# Patient Record
Sex: Female | Born: 1955 | Race: White | Hispanic: No | Marital: Married | Smoking: Former smoker
Health system: Southern US, Community
[De-identification: ages and names within clinical notes are randomized; demographics above are authoritative.]

## PROBLEM LIST (undated history)

## (undated) DIAGNOSIS — R87619 Unspecified abnormal cytological findings in specimens from cervix uteri: Secondary | ICD-10-CM

## (undated) DIAGNOSIS — R51 Headache: Secondary | ICD-10-CM

## (undated) DIAGNOSIS — M8580 Other specified disorders of bone density and structure, unspecified site: Secondary | ICD-10-CM

## (undated) DIAGNOSIS — N841 Polyp of cervix uteri: Secondary | ICD-10-CM

## (undated) DIAGNOSIS — R3915 Urgency of urination: Secondary | ICD-10-CM

## (undated) DIAGNOSIS — C50919 Malignant neoplasm of unspecified site of unspecified female breast: Secondary | ICD-10-CM

## (undated) DIAGNOSIS — D649 Anemia, unspecified: Secondary | ICD-10-CM

## (undated) DIAGNOSIS — N83209 Unspecified ovarian cyst, unspecified side: Secondary | ICD-10-CM

## (undated) DIAGNOSIS — R202 Paresthesia of skin: Secondary | ICD-10-CM

## (undated) DIAGNOSIS — D369 Benign neoplasm, unspecified site: Secondary | ICD-10-CM

## (undated) DIAGNOSIS — G8929 Other chronic pain: Secondary | ICD-10-CM

## (undated) DIAGNOSIS — D219 Benign neoplasm of connective and other soft tissue, unspecified: Secondary | ICD-10-CM

## (undated) DIAGNOSIS — IMO0002 Reserved for concepts with insufficient information to code with codable children: Secondary | ICD-10-CM

## (undated) DIAGNOSIS — N200 Calculus of kidney: Secondary | ICD-10-CM

## (undated) HISTORY — DX: Benign neoplasm of connective and other soft tissue, unspecified: D21.9

## (undated) HISTORY — DX: Paresthesia of skin: R20.2

## (undated) HISTORY — PX: WISDOM TOOTH EXTRACTION: SHX21

## (undated) HISTORY — DX: Benign neoplasm, unspecified site: D36.9

## (undated) HISTORY — DX: Polyp of cervix uteri: N84.1

## (undated) HISTORY — DX: Urgency of urination: R39.15

## (undated) HISTORY — PX: MASTECTOMY: SHX3

## (undated) HISTORY — DX: Reserved for concepts with insufficient information to code with codable children: IMO0002

## (undated) HISTORY — DX: Malignant neoplasm of unspecified site of unspecified female breast: C50.919

## (undated) HISTORY — PX: BREAST LUMPECTOMY: SHX2

## (undated) HISTORY — DX: Headache: R51

## (undated) HISTORY — DX: Other chronic pain: G89.29

## (undated) HISTORY — PX: RECONSTRUCTION BREAST W/ TRAM FLAP: SUR1079

## (undated) HISTORY — DX: Other specified disorders of bone density and structure, unspecified site: M85.80

## (undated) HISTORY — PX: BREAST ENHANCEMENT SURGERY: SHX7

## (undated) HISTORY — DX: Unspecified abnormal cytological findings in specimens from cervix uteri: R87.619

## (undated) HISTORY — DX: Anemia, unspecified: D64.9

## (undated) HISTORY — DX: Unspecified ovarian cyst, unspecified side: N83.209

## (undated) HISTORY — DX: Calculus of kidney: N20.0

---

## 1998-06-02 ENCOUNTER — Other Ambulatory Visit: Admission: RE | Admit: 1998-06-02 | Discharge: 1998-06-02 | Payer: Self-pay | Admitting: Obstetrics & Gynecology

## 1998-09-01 ENCOUNTER — Other Ambulatory Visit: Admission: RE | Admit: 1998-09-01 | Discharge: 1998-09-01 | Payer: Self-pay | Admitting: Obstetrics and Gynecology

## 1999-02-06 ENCOUNTER — Ambulatory Visit (HOSPITAL_COMMUNITY): Admission: RE | Admit: 1999-02-06 | Discharge: 1999-02-06 | Payer: Self-pay | Admitting: Obstetrics and Gynecology

## 1999-02-06 ENCOUNTER — Encounter: Payer: Self-pay | Admitting: Obstetrics and Gynecology

## 1999-07-09 ENCOUNTER — Inpatient Hospital Stay (HOSPITAL_COMMUNITY): Admission: AD | Admit: 1999-07-09 | Discharge: 1999-07-11 | Payer: Self-pay | Admitting: Obstetrics & Gynecology

## 1999-08-15 ENCOUNTER — Encounter: Admission: RE | Admit: 1999-08-15 | Discharge: 1999-11-13 | Payer: Self-pay | Admitting: Obstetrics and Gynecology

## 2003-06-09 ENCOUNTER — Emergency Department (HOSPITAL_COMMUNITY): Admission: EM | Admit: 2003-06-09 | Discharge: 2003-06-09 | Payer: Self-pay | Admitting: Emergency Medicine

## 2004-01-12 ENCOUNTER — Ambulatory Visit: Admission: RE | Admit: 2004-01-12 | Discharge: 2004-01-12 | Payer: Self-pay | Admitting: Gynecologic Oncology

## 2004-03-27 ENCOUNTER — Ambulatory Visit (HOSPITAL_COMMUNITY): Admission: RE | Admit: 2004-03-27 | Discharge: 2004-03-27 | Payer: Self-pay | Admitting: Obstetrics and Gynecology

## 2004-05-09 ENCOUNTER — Ambulatory Visit: Payer: Self-pay | Admitting: Oncology

## 2004-08-10 ENCOUNTER — Ambulatory Visit (HOSPITAL_COMMUNITY): Admission: RE | Admit: 2004-08-10 | Discharge: 2004-08-10 | Payer: Self-pay | Admitting: Obstetrics and Gynecology

## 2004-08-22 ENCOUNTER — Encounter: Admission: RE | Admit: 2004-08-22 | Discharge: 2004-08-22 | Payer: Self-pay | Admitting: Oncology

## 2004-08-23 ENCOUNTER — Other Ambulatory Visit: Admission: RE | Admit: 2004-08-23 | Discharge: 2004-08-23 | Payer: Self-pay | Admitting: Obstetrics and Gynecology

## 2004-08-28 ENCOUNTER — Encounter: Admission: RE | Admit: 2004-08-28 | Discharge: 2004-08-28 | Payer: Self-pay | Admitting: Obstetrics and Gynecology

## 2004-11-20 ENCOUNTER — Ambulatory Visit: Payer: Self-pay | Admitting: Oncology

## 2005-01-01 ENCOUNTER — Encounter: Admission: RE | Admit: 2005-01-01 | Discharge: 2005-01-01 | Payer: Self-pay | Admitting: Gynecologic Oncology

## 2005-01-23 ENCOUNTER — Ambulatory Visit: Admission: RE | Admit: 2005-01-23 | Discharge: 2005-01-23 | Payer: Self-pay | Admitting: Gynecologic Oncology

## 2005-06-15 ENCOUNTER — Ambulatory Visit: Payer: Self-pay | Admitting: Oncology

## 2005-06-26 ENCOUNTER — Ambulatory Visit (HOSPITAL_COMMUNITY): Admission: RE | Admit: 2005-06-26 | Discharge: 2005-06-26 | Payer: Self-pay | Admitting: Gynecologic Oncology

## 2005-07-03 ENCOUNTER — Other Ambulatory Visit: Admission: RE | Admit: 2005-07-03 | Discharge: 2005-07-03 | Payer: Self-pay | Admitting: Gynecologic Oncology

## 2005-07-03 ENCOUNTER — Ambulatory Visit: Admission: RE | Admit: 2005-07-03 | Discharge: 2005-07-03 | Payer: Self-pay | Admitting: Gynecologic Oncology

## 2005-07-03 ENCOUNTER — Encounter (INDEPENDENT_AMBULATORY_CARE_PROVIDER_SITE_OTHER): Payer: Self-pay | Admitting: *Deleted

## 2005-07-12 ENCOUNTER — Encounter: Admission: RE | Admit: 2005-07-12 | Discharge: 2005-07-12 | Payer: Self-pay | Admitting: Surgery

## 2005-07-20 ENCOUNTER — Encounter: Admission: RE | Admit: 2005-07-20 | Discharge: 2005-07-20 | Payer: Self-pay | Admitting: Surgery

## 2005-11-21 ENCOUNTER — Ambulatory Visit: Payer: Self-pay | Admitting: Oncology

## 2005-11-23 LAB — COMPREHENSIVE METABOLIC PANEL
ALT: 12 U/L (ref 0–40)
AST: 14 U/L (ref 0–37)
Alkaline Phosphatase: 71 U/L (ref 39–117)
CO2: 31 mEq/L (ref 19–32)
Sodium: 139 mEq/L (ref 135–145)
Total Bilirubin: 0.7 mg/dL (ref 0.3–1.2)
Total Protein: 7.3 g/dL (ref 6.0–8.3)

## 2005-11-23 LAB — CBC WITH DIFFERENTIAL/PLATELET
BASO%: 0.3 % (ref 0.0–2.0)
EOS%: 2.6 % (ref 0.0–7.0)
LYMPH%: 32.1 % (ref 14.0–48.0)
MCH: 31.4 pg (ref 26.0–34.0)
MCHC: 33.9 g/dL (ref 32.0–36.0)
MONO#: 0.4 10*3/uL (ref 0.1–0.9)
Platelets: 250 10*3/uL (ref 145–400)
RBC: 4.46 10*6/uL (ref 3.70–5.32)
WBC: 5.8 10*3/uL (ref 3.9–10.0)
lymph#: 1.8 10*3/uL (ref 0.9–3.3)

## 2006-06-12 ENCOUNTER — Ambulatory Visit: Payer: Self-pay | Admitting: Oncology

## 2006-07-01 LAB — CBC WITH DIFFERENTIAL/PLATELET
Basophils Absolute: 0 10*3/uL (ref 0.0–0.1)
Eosinophils Absolute: 0.1 10*3/uL (ref 0.0–0.5)
HCT: 39.7 % (ref 34.8–46.6)
HGB: 13.7 g/dL (ref 11.6–15.9)
LYMPH%: 36.6 % (ref 14.0–48.0)
MCV: 90.9 fL (ref 81.0–101.0)
MONO%: 6.1 % (ref 0.0–13.0)
NEUT#: 2.8 10*3/uL (ref 1.5–6.5)
Platelets: 247 10*3/uL (ref 145–400)
RDW: 12.7 % (ref 11.3–14.5)

## 2006-07-01 LAB — COMPREHENSIVE METABOLIC PANEL
Albumin: 4.7 g/dL (ref 3.5–5.2)
Alkaline Phosphatase: 66 U/L (ref 39–117)
BUN: 16 mg/dL (ref 6–23)
Glucose, Bld: 116 mg/dL — ABNORMAL HIGH (ref 70–99)
Potassium: 4.8 mEq/L (ref 3.5–5.3)

## 2006-07-01 LAB — CANCER ANTIGEN 27.29: CA 27.29: 26 U/mL (ref 0–39)

## 2006-07-24 ENCOUNTER — Encounter: Admission: RE | Admit: 2006-07-24 | Discharge: 2006-07-24 | Payer: Self-pay | Admitting: Oncology

## 2006-10-30 ENCOUNTER — Encounter: Admission: RE | Admit: 2006-10-30 | Discharge: 2006-10-30 | Payer: Self-pay | Admitting: Obstetrics and Gynecology

## 2006-12-19 ENCOUNTER — Ambulatory Visit: Payer: Self-pay | Admitting: Oncology

## 2006-12-24 LAB — CBC WITH DIFFERENTIAL/PLATELET
Basophils Absolute: 0 10*3/uL (ref 0.0–0.1)
Eosinophils Absolute: 0.1 10*3/uL (ref 0.0–0.5)
HCT: 40.3 % (ref 34.8–46.6)
HGB: 14.1 g/dL (ref 11.6–15.9)
LYMPH%: 36 % (ref 14.0–48.0)
MONO#: 0.4 10*3/uL (ref 0.1–0.9)
NEUT#: 2.5 10*3/uL (ref 1.5–6.5)
NEUT%: 52.8 % (ref 39.6–76.8)
Platelets: 228 10*3/uL (ref 145–400)
RBC: 4.48 10*6/uL (ref 3.70–5.32)
WBC: 4.8 10*3/uL (ref 3.9–10.0)

## 2006-12-24 LAB — CANCER ANTIGEN 27.29: CA 27.29: 29 U/mL (ref 0–39)

## 2006-12-24 LAB — COMPREHENSIVE METABOLIC PANEL
CO2: 28 mEq/L (ref 19–32)
Glucose, Bld: 84 mg/dL (ref 70–99)
Sodium: 142 mEq/L (ref 135–145)
Total Bilirubin: 0.6 mg/dL (ref 0.3–1.2)
Total Protein: 7.3 g/dL (ref 6.0–8.3)

## 2006-12-24 LAB — LACTATE DEHYDROGENASE: LDH: 129 U/L (ref 94–250)

## 2007-06-24 ENCOUNTER — Ambulatory Visit: Payer: Self-pay | Admitting: Oncology

## 2007-06-26 LAB — CBC WITH DIFFERENTIAL/PLATELET
Basophils Absolute: 0 10*3/uL (ref 0.0–0.1)
Eosinophils Absolute: 0.1 10*3/uL (ref 0.0–0.5)
HCT: 40.4 % (ref 34.8–46.6)
LYMPH%: 35.3 % (ref 14.0–48.0)
MONO#: 0.4 10*3/uL (ref 0.1–0.9)
NEUT#: 2.2 10*3/uL (ref 1.5–6.5)
NEUT%: 52.5 % (ref 39.6–76.8)
Platelets: 251 10*3/uL (ref 145–400)
WBC: 4.2 10*3/uL (ref 3.9–10.0)

## 2007-06-27 LAB — COMPREHENSIVE METABOLIC PANEL
CO2: 27 mEq/L (ref 19–32)
Creatinine, Ser: 0.68 mg/dL (ref 0.40–1.20)
Glucose, Bld: 63 mg/dL — ABNORMAL LOW (ref 70–99)
Total Bilirubin: 0.6 mg/dL (ref 0.3–1.2)

## 2007-06-27 LAB — CANCER ANTIGEN 27.29: CA 27.29: 25 U/mL (ref 0–39)

## 2007-06-27 LAB — LACTATE DEHYDROGENASE: LDH: 100 U/L (ref 94–250)

## 2007-07-03 LAB — VITAMIN D 1,25 DIHYDROXY: Vit D, 1,25-Dihydroxy: 113 pg/mL — ABNORMAL HIGH (ref 6–62)

## 2007-09-09 ENCOUNTER — Encounter: Admission: RE | Admit: 2007-09-09 | Discharge: 2007-09-09 | Payer: Self-pay | Admitting: Oncology

## 2007-12-24 ENCOUNTER — Ambulatory Visit: Payer: Self-pay | Admitting: Oncology

## 2007-12-26 LAB — CBC WITH DIFFERENTIAL/PLATELET
Basophils Absolute: 0 10*3/uL (ref 0.0–0.1)
EOS%: 3.2 % (ref 0.0–7.0)
HGB: 14.2 g/dL (ref 11.6–15.9)
MCH: 31.5 pg (ref 26.0–34.0)
MONO#: 0.3 10*3/uL (ref 0.1–0.9)
NEUT#: 2.2 10*3/uL (ref 1.5–6.5)
RDW: 12.5 % (ref 11.3–14.5)
WBC: 4.2 10*3/uL (ref 3.9–10.0)
lymph#: 1.5 10*3/uL (ref 0.9–3.3)

## 2007-12-30 LAB — FOLLICLE STIMULATING HORMONE: FSH: 94.6 m[IU]/mL

## 2007-12-30 LAB — COMPREHENSIVE METABOLIC PANEL
ALT: 13 U/L (ref 0–35)
AST: 16 U/L (ref 0–37)
Albumin: 4.6 g/dL (ref 3.5–5.2)
BUN: 13 mg/dL (ref 6–23)
Calcium: 9.8 mg/dL (ref 8.4–10.5)
Chloride: 103 mEq/L (ref 96–112)
Potassium: 4.4 mEq/L (ref 3.5–5.3)

## 2008-12-13 ENCOUNTER — Emergency Department (HOSPITAL_BASED_OUTPATIENT_CLINIC_OR_DEPARTMENT_OTHER): Admission: EM | Admit: 2008-12-13 | Discharge: 2008-12-13 | Payer: Self-pay | Admitting: Emergency Medicine

## 2008-12-13 ENCOUNTER — Ambulatory Visit: Payer: Self-pay | Admitting: Diagnostic Radiology

## 2008-12-23 ENCOUNTER — Ambulatory Visit: Payer: Self-pay | Admitting: Oncology

## 2009-01-21 ENCOUNTER — Ambulatory Visit: Payer: Self-pay | Admitting: Oncology

## 2009-01-25 LAB — CBC WITH DIFFERENTIAL/PLATELET
Basophils Absolute: 0 10*3/uL (ref 0.0–0.1)
EOS%: 2.8 % (ref 0.0–7.0)
Eosinophils Absolute: 0.1 10*3/uL (ref 0.0–0.5)
HGB: 13.6 g/dL (ref 11.6–15.9)
LYMPH%: 36.4 % (ref 14.0–49.7)
MCH: 31.3 pg (ref 25.1–34.0)
MCV: 91.4 fL (ref 79.5–101.0)
MONO%: 8.9 % (ref 0.0–14.0)
NEUT#: 2 10*3/uL (ref 1.5–6.5)
Platelets: 234 10*3/uL (ref 145–400)

## 2009-01-26 LAB — COMPREHENSIVE METABOLIC PANEL
Alkaline Phosphatase: 75 U/L (ref 39–117)
BUN: 12 mg/dL (ref 6–23)
Creatinine, Ser: 0.81 mg/dL (ref 0.40–1.20)
Glucose, Bld: 83 mg/dL (ref 70–99)
Total Bilirubin: 0.4 mg/dL (ref 0.3–1.2)

## 2009-01-26 LAB — CANCER ANTIGEN 27.29: CA 27.29: 22 U/mL (ref 0–39)

## 2009-01-26 LAB — VITAMIN D 25 HYDROXY (VIT D DEFICIENCY, FRACTURES): Vit D, 25-Hydroxy: 77 ng/mL (ref 30–89)

## 2010-01-16 ENCOUNTER — Ambulatory Visit: Payer: Self-pay | Admitting: Oncology

## 2010-01-18 LAB — CBC WITH DIFFERENTIAL/PLATELET
Basophils Absolute: 0 10*3/uL (ref 0.0–0.1)
EOS%: 2.7 % (ref 0.0–7.0)
Eosinophils Absolute: 0.1 10*3/uL (ref 0.0–0.5)
MCHC: 34.8 g/dL (ref 31.5–36.0)
MONO#: 0.4 10*3/uL (ref 0.1–0.9)
Platelets: 223 10*3/uL (ref 145–400)
lymph#: 1.8 10*3/uL (ref 0.9–3.3)

## 2010-01-18 LAB — COMPREHENSIVE METABOLIC PANEL
AST: 19 U/L (ref 0–37)
BUN: 14 mg/dL (ref 6–23)
CO2: 30 mEq/L (ref 19–32)
Glucose, Bld: 89 mg/dL (ref 70–99)
Potassium: 3.9 mEq/L (ref 3.5–5.3)
Sodium: 143 mEq/L (ref 135–145)
Total Protein: 6.8 g/dL (ref 6.0–8.3)

## 2010-04-07 ENCOUNTER — Emergency Department (HOSPITAL_BASED_OUTPATIENT_CLINIC_OR_DEPARTMENT_OTHER)
Admission: EM | Admit: 2010-04-07 | Discharge: 2010-04-08 | Payer: Self-pay | Source: Home / Self Care | Admitting: Emergency Medicine

## 2010-05-04 ENCOUNTER — Encounter
Admission: RE | Admit: 2010-05-04 | Discharge: 2010-05-04 | Payer: Self-pay | Source: Home / Self Care | Attending: Oncology | Admitting: Oncology

## 2010-05-13 ENCOUNTER — Encounter: Payer: Self-pay | Admitting: Oncology

## 2010-05-14 ENCOUNTER — Encounter: Payer: Self-pay | Admitting: Gynecologic Oncology

## 2010-05-14 ENCOUNTER — Encounter: Payer: Self-pay | Admitting: Oncology

## 2010-05-14 ENCOUNTER — Encounter: Payer: Self-pay | Admitting: Family Medicine

## 2010-05-15 ENCOUNTER — Encounter: Payer: Self-pay | Admitting: Urology

## 2010-05-15 ENCOUNTER — Encounter: Payer: Self-pay | Admitting: Oncology

## 2010-07-03 LAB — URINE CULTURE
Colony Count: NO GROWTH
Culture  Setup Time: 201112170627
Culture: NO GROWTH

## 2010-07-03 LAB — URINALYSIS, ROUTINE W REFLEX MICROSCOPIC
Bilirubin Urine: NEGATIVE
Ketones, ur: NEGATIVE mg/dL
Nitrite: NEGATIVE
Specific Gravity, Urine: 1.017 (ref 1.005–1.030)
pH: 7 (ref 5.0–8.0)

## 2010-07-03 LAB — URINE MICROSCOPIC-ADD ON

## 2010-07-29 LAB — URINALYSIS, ROUTINE W REFLEX MICROSCOPIC
Glucose, UA: NEGATIVE mg/dL
Nitrite: NEGATIVE
Protein, ur: NEGATIVE mg/dL
pH: 5.5 (ref 5.0–8.0)

## 2010-07-29 LAB — URINE CULTURE: Colony Count: 4000

## 2010-07-29 LAB — URINE MICROSCOPIC-ADD ON

## 2010-09-08 NOTE — Consult Note (Signed)
Cynthia Rosario, BONINE NO.:  [id number]   MEDICAL RECORD NO.:  [id number]          PATIENT TYPE:  OUT   LOCATION:  GYN                          FACILITY:  [facility]   PHYSICIAN:  John T. Kyla Balzarine, M.D.    DATE OF BIRTH:  [birth year]   DATE OF CONSULTATION:  07/03/2005  DATE OF DISCHARGE:                                   CONSULTATION   This is a return visit to the GYN oncology.   CHIEF COMPLAINT:  Follow up of ovarian cyst with prior history of breast  cancer.   HISTORY OF PRESENT ILLNESS:  The patient has been followed with ultrasounds  dating back to 2003 revealing a small left ovarian mass on some scans  measuring up to 3 cm, partially cystic, and with features suggesting a  dermoid. CA-125 values in the past were less than 15. She has had  ultrasounds and MRI scans with the MRI scan in September 2006 revealing a  lesion that was measured at 2 x 2 x 4.6 cm, depending on interpretation,  versus 18 x 19 mm. The patient underwent a transabdominal and vaginal probe  ultrasound; and again noted is a 1.8 x 1.4 x 1.6 cm mixed cystic and solid  lesion, essentially stable in comparison to multiple scans in the past. The  patient is entirely asymptomatic; and does not wish prophylactic BSO. She  had been amenorrheic in September but had a normal period at the end of  February.   PAST MEDICAL HISTORY:  Significant for stage T2, N1 breast cancer with  positive estrogen and progesterone receptors and positive __________2/new  treated with radical mastectomy and TRAM reconstruction in September 2003  which has required hernia repairs and revisions. She had Adriamycin and  Cytoxan; followed by 4 cycles of Taxotere and was on tamoxifen, briefly. FSH  values have fluctuated. She has osteopenia; and is status post NSVD x2 and  SAD x1.   CURRENT MEDICATIONS:  Multivitamins, fish oil, and calcium.   ALLERGIES:  None known.   PERSONAL AND SOCIAL HISTORY:  Married, denies tobacco  or ethanol use.   FAMILY HISTORY:  Negative for breast or ovarian malignancy.   REVIEW OF SYSTEMS:  The patient is extremely anxious and worries constantly  about somatic symptoms; however, fully functional. She denies pain. Other  than noted above, a 10-point review of systems negative.   PHYSICAL EXAMINATION:  VITAL SIGNS:  Weight 145 pounds and vital signs  stable/afebrile.  GENERAL:  The patient was anxious, alert, and oriented x3.  ABDOMEN:  The abdomen was soft and benign with well-healed transverse  incision. I am, again, unable to detect any hernia. There is no ascites,  mass or tenderness.  EXTREMITIES:  Full strength and range of motion without edema.  PELVIC:  External genitalia and BUS are normal. Vaginal mucosa is clear with  normal bladder and urethra. The cervix is parous; and there is a 5-mm,  endocervical polyp seen, and removed with sponge stick. Bimanual and  rectovaginal examinations disclose mobile, upper limit size uterus with no  adnexal pathology palpable, and no tenderness or  cul-de-sac nodularity.   LAB:  Ultrasound performed on June 26, 2005 and compared to scans dating  back to May 2006 reveal a stable left ovarian dermoid overall measuring  1.8 x 1.4 x 1.6 cm. Normal contralateral ovary and the uterus is normal with  some small fibroids. The endometrium measured 10 mm.   ASSESSMENT:  Stable benign adnexal cystic mass, likely dermoid;  asymptomatic.   PLAN:  Cytology was updated and cervical polypectomy performed. Results will  be communicated to the patient. At this juncture, she has been followed for  many years with frequent scans; and, I believe, that she could be followed  at annual intervals by Dr. Pennie Rushing. We would certainly be glad to see her  back at any time, but as she does not wish prophylactic BSO; and is  asymptomatic with is a nonenlarging mass, it would be my recommendation that  she simply be followed with ultrasounds on an annual  basis.      John T. Kyla Balzarine, M.D.  Electronically Signed     JTS/MEDQ  D:  07/03/2005  T:  07/04/2005  Job:  [id number]   cc:   Harrison Mons  Fax: [phone]   Telford Nab, R.N.  [address]  [place], [place] [address]   Valentino Hue. Magrinat, M.D.  Fax: [phone]   Hal Morales, M.D.  Fax: [phone]   Lynett Fish, M.D.

## 2010-09-08 NOTE — H&P (Signed)
[facility]  Patient:    Cynthia Rosario, TIPPING                     MRN: [id number] Adm. Date:  [id number] Attending:  Cleatrice Burke Dictator:   Vance Gather Duplantis, C.N.M.                         History and Physical  HISTORY OF PRESENT ILLNESS:       [name] is a 55 year old, married white female, gravida 3, para 1-0-1-1, at [redacted] weeks gestation who presents complaining of possibly leaking some fluid this morning at approximately 7 a.m. with no large gushes, however.  She now reports uterine contractions that are mild about every three to five minutes.  She denies any heavy bleeding.  She denies any nausea, vomiting, headache, or visual disturbances.  She reports positive fetal movement. Her pregnancy has been followed at [facility] by the certified midwife service and has been at risk for history of advanced maternal age with a normal  amniocentesis and to rubella not immune.  She also reports that her group B Strep is negative.  OB/GYN HISTORY:                   She is a gravida 3, para 1-0-1-1, who had delivery of a viable female infant in 1995 who weighed 7 pounds 10 ounces at [redacted]  weeks gestation following a two to three-hour labor.  She delivered vaginally with no pain medicine.  In May of 2000, she had a spontaneous miscarriage without complications.  Other GYN history includes abnormal Pap in 1998, and the repeat was normal.  In February 2000, she had a cervical polyp removed.  ALLERGIES:                        No known drug allergies.  PAST MEDICAL HISTORY:             She reports having had the usual childhood diseases.  She has had wisdom teeth removed but no other surgeries or hospitalizations.  FAMILY HISTORY:                   Significant for father with MI, mother with hypertension, mother with varicosities, maternal grandmother with adult onset diabetes, brother with depression.  GENETIC HISTORY:                   Significant only for the fact that she is over the age of 78 and had a normal amniocentesis.  SOCIAL HISTORY:                   She is married to [employer] who is involved and supportive.  They are of the [employer] [facility] faith.  They are both employed full time.  They deny any illicit drug use, alcohol, or smoking with this pregnancy.  PRENATAL LABORATORY DATA:         Blood type is B positive.  Her antibody screen is negative, syphilis nonreactive, rubella negative, hepatitis B surface antigen is negative.  Gonorrhea and chlamydia were declined.  Amniocentesis was within normal limits.   Her 36-week beta Strep was negative.  PHYSICAL EXAMINATION:  VITAL SIGNS:                      Stable.  She is afebrile.  HEENT:  Grossly within normal limits.  HEART:                            Regular rhythm and rate.  CHEST:                            Clear.  BREASTS:                          Soft and nontender.  ABDOMEN:                          Gravid.  Uterine contractions every 3 to 5 minutes and mild.  Fetal heart rate is reactive and reassuring.  PELVIC:                           Sterile speculum exam was done.  No pooling was noted.  Crist Fat was negative.  Nitrazine was questionably positive.  Cervix was 5 m, 80% vertex, -1 to 0 station.  EXTREMITIES:                      Within normal limits.  ASSESSMENT:                       1. Intrauterine pregnancy at term.                                   2. Early active labor with possible spontaneous                                      rupture of membranes around 7 oclock this                                      morning.                                   3. Negative group B Streptococcus.  PLAN:                             Admit to labor and delivery, to follow routine CNM orders, and to notify Dr. Cleatrice Burke of patients admission. DD:  07/09/99 TD:  07/09/99 Job: [id number]

## 2012-01-17 ENCOUNTER — Telehealth: Payer: Self-pay | Admitting: Obstetrics and Gynecology

## 2012-01-18 ENCOUNTER — Telehealth: Payer: Self-pay

## 2012-01-18 NOTE — Telephone Encounter (Signed)
Tc to pt per telephone call rgdg records. Informed pt records have been received from [place]. Will refer pt to Dr. Duard Brady per vph for eval of oophorectomy pt request to have due to h/o breast cancer. Spoke with Dr. Denman George office to inform of referral. Receptionist will call pt to sched appt per pt's request. Pt agrees. Records faxed to [phone].

## 2012-01-18 NOTE — Telephone Encounter (Signed)
Will consult with [facility] rgdg telephone call.

## 2012-01-24 NOTE — Telephone Encounter (Signed)
Records are available. The patient will be referred to Dr. Rockney Ghee at [facility] for evaluation for removal of her ovaries with abdominal mesh in place

## 2012-01-25 NOTE — Telephone Encounter (Signed)
Appt sched 01/29/12 with vph in system.

## 2012-01-29 ENCOUNTER — Telehealth: Payer: Self-pay

## 2012-01-29 ENCOUNTER — Ambulatory Visit (INDEPENDENT_AMBULATORY_CARE_PROVIDER_SITE_OTHER): Payer: Commercial Indemnity | Admitting: Obstetrics and Gynecology

## 2012-01-29 ENCOUNTER — Ambulatory Visit (INDEPENDENT_AMBULATORY_CARE_PROVIDER_SITE_OTHER): Payer: Commercial Indemnity

## 2012-01-29 ENCOUNTER — Other Ambulatory Visit: Payer: Self-pay | Admitting: Obstetrics and Gynecology

## 2012-01-29 ENCOUNTER — Encounter: Payer: Self-pay | Admitting: Obstetrics and Gynecology

## 2012-01-29 VITALS — BP 112/68 | Temp 98.2°F | Ht 69.0 in | Wt 152.0 lb

## 2012-01-29 DIAGNOSIS — R102 Pelvic and perineal pain unspecified side: Secondary | ICD-10-CM

## 2012-01-29 DIAGNOSIS — N200 Calculus of kidney: Secondary | ICD-10-CM | POA: Insufficient documentation

## 2012-01-29 DIAGNOSIS — C50919 Malignant neoplasm of unspecified site of unspecified female breast: Secondary | ICD-10-CM

## 2012-01-29 DIAGNOSIS — N83209 Unspecified ovarian cyst, unspecified side: Secondary | ICD-10-CM

## 2012-01-29 DIAGNOSIS — N841 Polyp of cervix uteri: Secondary | ICD-10-CM | POA: Insufficient documentation

## 2012-01-29 DIAGNOSIS — D219 Benign neoplasm of connective and other soft tissue, unspecified: Secondary | ICD-10-CM | POA: Insufficient documentation

## 2012-01-29 DIAGNOSIS — C50412 Malignant neoplasm of upper-outer quadrant of left female breast: Secondary | ICD-10-CM | POA: Insufficient documentation

## 2012-01-29 DIAGNOSIS — M8580 Other specified disorders of bone density and structure, unspecified site: Secondary | ICD-10-CM | POA: Insufficient documentation

## 2012-01-29 DIAGNOSIS — N949 Unspecified condition associated with female genital organs and menstrual cycle: Secondary | ICD-10-CM

## 2012-01-29 LAB — POCT URINALYSIS DIPSTICK
Bilirubin, UA: NEGATIVE
Blood, UA: NEGATIVE
Glucose, UA: NEGATIVE
Nitrite, UA: NEGATIVE
Spec Grav, UA: <=1.005

## 2012-01-29 NOTE — Telephone Encounter (Signed)
Tc to pt. Pt informed CT scan sched 01/31/12@1 :40p ([facility] [address]). Informed pt to pick up contrast by 01/30/12 @5 :00p. Pt voices understanding.

## 2012-01-29 NOTE — Progress Notes (Signed)
GYN PROBLEM VISIT Pelvic pain   Subjective: [name] is a 56 y.o. year old female,G3P0012, who presents for a problem visit. She is being treated for recurrent right breast cancer at the [facility] center.   Zoladex was started in Dec 2012.  It was  continued monthly through 09/2011 when pt decided to discontinue while away in [place] for a month.   In late Aug, she experienced discomfort with  pressure on her bladder.  She was seen by her PCP who treated her for UTI.  She took some of the prescribed antibiotics but not a full course.  Urine culture was neg. She made an appt with her urologist, but has cancelled that appt since her urinary symptoms have resolved. Over the next few weeks she developed crampy abdominal and pelvic pain radiating to her back, as if she was about to start a menstrual cycle. It was also reminiscient of pain experienced when she had a kidney stone in the past. This lasted for about a month but no bleeding occurred.  No GI symptoms.  No return of urinary symptoms.   She has a hx of a small left ovarian cyst thought to represent a dermoid which has be asymptomatic and stable on several ultrasound and CT evaluations since 2003.  The pt had declined to have this removed when it was discovered initially.  More recently, she has considered having both her ovaries removed to eliminate even the smallest amount of ovarian hormone. She has been referred to Dr. Duard Brady for that discussion and has an appt on 02/21/12 @ 12:00p with Dr.Gehrig. Vag. Discharge:no Odor:no Fever:no Irreg.Periods:no Dyspareunia:no Dysuria:no Frequency:no Urgency:no Hematuria:no Kidney stones:yes Constipation:no Diarrhea:no Rectal Bleeding: no Vomiting:no Nausea:yes Pregnant:no Fibroids:yes Endometriosis:no Hx of Ovarian Cyst:yes Hx IUD:no Hx STD-PID:no Appendectomy:no Gall Bladder Dz:no  Objective: BP 112/68  Temp 98.2 F (36.8 C) (Oral)  Ht 5\' 9"  (1.753 m)  Wt 152 lb (68.947 kg)   BMI 22.45 kg/m2  General: alert, cooperative, appears stated age, fatigued and no distress GI: soft, non-tender; bowel sounds normal; no masses,  no organomegaly.  No rebound Back:  No CVAT PELVIC: External genitalia: normal general appearance Vaginal: atrophic mucosa Cervix: atrophic Adnexa: non palpable Uterus: normal single, nontender Rectal: deferred  UA: Glucose, UA neg Bilirubin, UA neg Ketones, UA neg Spec Grav, UA <=1.005 Blood, UA neg pH, UA 6.0 Protein, UA neg Urobilinogen, UA negative Nitrite, UA neg Leukocytes, UA Negativ   ULTRASOUND: Uterus: Length: 6.42 cm   Width:  4.28 cm   Height:  3.53 cm  Left Ovary: Length: 2.27 cm   Width: 1.90 cm   Height: 1.22 cm  Right Ovary: Length: 2.46 cm   Width: 1.42 cm   Height: 1.28 cm  Endo thickness:  4.64 mm   Left ovary:Normal Right ovary:Normal Fibroids:no   CDS fluid:no  Comment: Anteverted uterus, normal endometrium, no fluid in CDS, normal Adnexa's  Assessment: Pelvic pain without clear GYN etiology Hx small fibroids, now s/p Zoladex therapy Hx small ovarian cystic lesion, not noted on today's ultrasound Recurrent breast cancer without BrCa testing secondary to patient preference Hx kidney stone without hematuria today   Plan: Abdominal pelvic CT scan F/u based on result Return to office prn if symptoms worsen or fail to improve.   Dierdre Forth, MD  01/29/2012 9:38 AM

## 2012-01-30 ENCOUNTER — Other Ambulatory Visit: Payer: Self-pay

## 2012-01-30 DIAGNOSIS — R102 Pelvic and perineal pain: Secondary | ICD-10-CM

## 2012-01-31 ENCOUNTER — Inpatient Hospital Stay
Admission: RE | Admit: 2012-01-31 | Discharge: 2012-01-31 | Payer: Self-pay | Source: Ambulatory Visit | Attending: Obstetrics and Gynecology | Admitting: Obstetrics and Gynecology

## 2012-02-01 ENCOUNTER — Telehealth: Payer: Self-pay | Admitting: Obstetrics and Gynecology

## 2012-02-01 NOTE — Telephone Encounter (Signed)
vph pt 

## 2012-02-01 NOTE — Telephone Encounter (Signed)
Tc to pt per telephone. Pt questionable about having CT scan done due to having a normal pelvic U/S on 01/29/12. Pt states,"pelvic pain has gone away now". Pt informed CT scan ordered not only due to pelvic pain, but h/o breast ca and h/o left ovarian cyst(? Dermoid). Pt expressed her concerns about having repeat CT scan if other options can be considered first. Will consult with vh on 02/06/12 per recs. Pt voices understanding.

## 2012-02-03 NOTE — Telephone Encounter (Signed)
Reviewed info from pt's call.  She has appt with Dr. Duard Brady, Gyn Onc who will have access to all ultrasound info. She can discuss the pros and cons of doing the CT at that 02/21/12 visit, so will cancel CT we scheduled for now.

## 2012-02-05 NOTE — Telephone Encounter (Signed)
Lm on vm to cb per vph recs.  

## 2012-02-05 NOTE — Telephone Encounter (Signed)
Tc from pt. Told pt vph recs to discuss need for further CT scans with Dr.Gehrig on 02/21/12. Pt voices understanding.

## 2012-02-21 ENCOUNTER — Encounter: Payer: Self-pay | Admitting: Gynecologic Oncology

## 2012-02-21 ENCOUNTER — Ambulatory Visit: Payer: Commercial Indemnity | Admitting: Lab

## 2012-02-21 ENCOUNTER — Ambulatory Visit: Payer: Commercial Indemnity | Attending: Gynecologic Oncology | Admitting: Gynecologic Oncology

## 2012-02-21 VITALS — BP 132/82 | HR 90 | Temp 98.2°F | Resp 18 | Ht 68.86 in | Wt 152.8 lb

## 2012-02-21 DIAGNOSIS — Z5189 Encounter for other specified aftercare: Secondary | ICD-10-CM | POA: Insufficient documentation

## 2012-02-21 DIAGNOSIS — Z853 Personal history of malignant neoplasm of breast: Secondary | ICD-10-CM | POA: Insufficient documentation

## 2012-02-21 DIAGNOSIS — R109 Unspecified abdominal pain: Secondary | ICD-10-CM | POA: Insufficient documentation

## 2012-02-21 LAB — FOLLICLE STIMULATING HORMONE: FSH: 59.8 m[IU]/mL

## 2012-02-21 LAB — LUTEINIZING HORMONE: LH: 23.2 m[IU]/mL

## 2012-02-21 NOTE — Patient Instructions (Signed)
Please obtain your surgical records regarding the mesh placement.

## 2012-02-21 NOTE — Progress Notes (Signed)
Consult Note: Gyn-Onc  Cynthia Rosario 56 y.o. female  CC:  Chief Complaint  Patient presents with  . Hx Breast Cancer    New, seen last in 2007    HPI: Patient is seen today in consultation at the request of Dr. Mikey Bussing for consideration of BSO.  Patient is a 56 year old female accompanied today to the clinic by her husband with a history of a node-positive, right-sided hormone receptor-positive, HER2-negative recurrent breast cancer with extensive adenopathy.   HISTORY OF PRESENT ILLNESS:  1. In 2003 diagnosis, stage II left-sided breast cancer at age of 13 and 2 of 55 lymph nodes positive. Tumor was ER positive. Received TAC chemotherapy, local regional radiation from Dr. Dawna Part and took tamoxifen for 9 months, discontinuing this with secondary side effects.  2. In 12/2010, the patient was seeing Dr. Nedra Hai for right breast augmentation and pt was noted to have a lesion in the upper outer quadrant than on imaging studies measured 3.3 x 1.8 cm, and a subsequent biopsy on 01/11/2011 showed an invasive ductal cancer with lobular features. It was 100% ER positive, 88% PR positive and HER-2 negative.  3. After discussion, the patient elected to have bilateral mastectomies and had a right skin-sparing mastectomy and sentinel node procedure and then a completion right axillary dissection and removal of her right breast implant. She had a right immediate reconstruction with a tissue expander.  4. Started then on Arimidex and Zoladex because of concerns about persistent ovarian function even though she meets postmenopausal criteria. Gonadotropins in 9/12 c/w menopausal. 5. 12/31/11 D/C Zolodex and continue on Arimidex.   She has a distant history of ovarian cyst was last seen by Dr. Kyla Balzarine in 2007. It appears that initially in 2003 shows small left ovarian mass measuring up to 3 cm it was partially cystic with features suggesting a dermoid. CA125 was normal. She then had ultrasounds and MRIs through September  of 06 that showed the lesion measured 2 x 2 centimeters and 1.8 x 1.9 cm. Most recently in March of 2007 the cyst was 1.8 x 1.4 x 1.6 cm. She was seen by Dr. Pennie Rushing for her routine annual examination was doing quite well. She then noticed that she had some pressure in her bladder in August of 2012. She was seen by her primary care physician who treated her for urinary tract infection she took some but not all the antibody that were prescribed. Over the next several weeks she developed crampy abdominal and pelvic pain radiating to her back to you about starting menstrual cycle. Is somewhat reminiscent cannot entirely like to check a kidney stone in the past. She had an ultrasound performed to Dr. Pennie Rushing in the office revealed the uterus to be 6.4 x 4.2 x 3.5 cm. The endometrial stripe thickness was 4.6 mm. There was no free fluid was normal at adnexa with no ovarian cysts identified. She comes in today for discussion of this and whether or not she should have a bilateral salpingo-oophorectomy and thickening done through the abdominal wall mesh.   She also states that she feels nauseated and another 4 feeling. She did take with her flaxseed about a month and probably and turning in her stomach. She states that she wakes up and ultimately nausea. 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 There've been no change in her bowel habits she. She stay she's never had a colonoscopy and has  not done stool cards. Most recently she would have to wait up and I would nausea and feels that if she gets up and moves around. There's been no emesis associated with this. There's no bloating no she does feel fullness at the level of the TRAM flap. She is a BRCA unknown status. She states it has been recommended to her in the past that she have testing but she has declined. She has an appointment with Dr. Garlon Hatchet her medical oncologist at [facility] on December 9.   We do have an operative note from her original TRAM flap in 2003 however that does not comment on the hernia repair that she had is that was after that. Interval History:   Review of Systems  Current Meds:  Outpatient Encounter Prescriptions as of 02/21/2012  Medication Sig Dispense Refill  . anastrozole (ARIMIDEX) 1 MG tablet Take 1 mg by mouth daily.      . Goserelin Acetate (ZOLADEX [place]) Inject into the skin.        Allergy: No Known Allergies  Social Hx:   History   Social History  . Marital Status: Married    Spouse Name: N/A    Number of Children: N/A  . Years of Education: N/A   Occupational History  . Not on file.   Social History Main Topics  . Smoking status: Former Games developer  . Smokeless tobacco: Never Used  . Alcohol Use: Yes  . Drug Use: No  . Sexually Active: Yes    Birth Control/ Protection: Condom   Other Topics Concern  . Not on file   Social History Narrative  . No narrative on file    Past Surgical Hx:  Past Surgical History  Procedure Date  . Wisdom tooth extraction   . Breast lumpectomy     x2  . Mastectomy     x2  . Reconstruction breast w/ tram flap   . Breast enhancement surgery     Past Medical Hx:  Past Medical History  Diagnosis Date  . Breast cancer   . Cancer     breast  . Dermoid   . Ovarian cyst   . Urinary urgency   . Osteopenia   . Cervical polyp   . Abnormal pap   . Fibroid   . Kidney stones     Family Hx:  Family History  Problem Relation Age of Onset  . Heart disease Father   . Hypertension Mother     Vitals:  Blood pressure 132/82, pulse 90, temperature 98.2 F (36.8 C), temperature source Oral, resp. rate 18, height 5' 8.86" (1.749 m), weight 152 lb 12.8 oz (69.31 kg).  Physical Exam:  Well-nourished well-developed female in no acute distress.  Abdomen: Well-healed lower abdominal incision. Abdomen is soft nontender nondistended no palpable masses. There is no hepatosplenomegaly. There is no fluid  wave. There is no rebound or guarding.  Groins: No lymphadenopathy.  Extremities: No edema.  Pelvic: External genitalia within normal limits. Bimanual examination the cervix is palpably normal the corpus is of normal size shape and consistency. There is no adnexal masses.  Assessment/Plan: 56 year old with the breast cancer history as above. In the distant past she had an ovarian cyst and during that time declined removal of that cyst. Now she is having some lower abdominal symptoms and is feeling that she has return of ovarian function. I discussed with her and her husband is inhibitors 3 separate issues:  #1. We will check gonadotropins to see if she  is still menopausal ensure that she has not had any rebound ovarian function after stopping her Zoladex and Arimidex. If the gonadotropins and estradiol levels are consistent with her being menopausal then I do not believe that there would be of benefit and removal of the ovaries from a breast cancer perspective. I did speak with Dr. Garlon Hatchet by phone today and he would agree with this.    #2 she should have BRCA testing. Apparently this has been recommended to her several times in the past. Should she be BRCA positive for either BRCA1 or 2 we would recommend removal of the ovaries even in the absence of any obvious pathology. If she is BRCA negative, she would not meet criteria for any ovarian cancer screening.   #3 For her other symptom,  I do not believe that they are necessarily related to any kind of ovarian issue and I recommended that she return to see her primary care physician for further evaluation of her vertigo and nausea. We will contact her with the results of the hormone levels and we will determine her disposition pending those results. She will take responsibility of obtaining her operative note with regards to the mesh so that should she need surgery we have that information.  Sharalyn Lomba A., MD 02/21/2012, 1:50 PM

## 2012-03-06 ENCOUNTER — Telehealth: Payer: Self-pay | Admitting: Gynecologic Oncology

## 2012-03-06 NOTE — Telephone Encounter (Signed)
Patient informed that Dr. Duard Brady had reviewed her lab work and determined that she is menopausal.  No concerns or questions voiced.  Instructed to call for any needs.

## 2012-03-24 ENCOUNTER — Ambulatory Visit: Payer: Commercial Indemnity | Attending: Internal Medicine | Admitting: Physical Therapy

## 2012-03-24 DIAGNOSIS — M2569 Stiffness of other specified joint, not elsewhere classified: Secondary | ICD-10-CM | POA: Insufficient documentation

## 2012-03-24 DIAGNOSIS — M545 Low back pain, unspecified: Secondary | ICD-10-CM | POA: Insufficient documentation

## 2012-03-24 DIAGNOSIS — IMO0001 Reserved for inherently not codable concepts without codable children: Secondary | ICD-10-CM | POA: Insufficient documentation

## 2012-03-27 ENCOUNTER — Ambulatory Visit: Payer: Commercial Indemnity

## 2012-04-02 ENCOUNTER — Ambulatory Visit: Payer: Commercial Indemnity | Admitting: Physical Therapy

## 2012-04-03 ENCOUNTER — Ambulatory Visit: Payer: Commercial Indemnity | Admitting: Physical Therapy

## 2012-04-10 ENCOUNTER — Ambulatory Visit: Payer: Commercial Indemnity | Admitting: Physical Therapy

## 2012-04-10 ENCOUNTER — Ambulatory Visit: Payer: Commercial Indemnity

## 2012-04-14 ENCOUNTER — Ambulatory Visit: Payer: Commercial Indemnity

## 2012-04-14 ENCOUNTER — Ambulatory Visit: Payer: Commercial Indemnity | Admitting: Physical Therapy

## 2012-04-15 ENCOUNTER — Ambulatory Visit: Payer: Commercial Indemnity | Admitting: Physical Therapy

## 2012-04-21 ENCOUNTER — Encounter: Payer: Commercial Indemnity | Admitting: Rehabilitative and Restorative Service Providers"

## 2012-04-21 ENCOUNTER — Ambulatory Visit: Payer: Commercial Indemnity | Admitting: Physical Therapy

## 2012-04-21 ENCOUNTER — Encounter: Payer: Commercial Indemnity | Admitting: Physical Therapy

## 2012-04-22 ENCOUNTER — Ambulatory Visit: Payer: Commercial Indemnity | Admitting: Physical Therapy

## 2012-04-24 ENCOUNTER — Ambulatory Visit: Payer: Commercial Indemnity | Admitting: Physical Therapy

## 2012-04-29 ENCOUNTER — Ambulatory Visit: Payer: Commercial Indemnity | Attending: Internal Medicine | Admitting: Physical Therapy

## 2012-05-06 ENCOUNTER — Ambulatory Visit: Payer: Commercial Indemnity | Admitting: Physical Therapy

## 2012-05-13 ENCOUNTER — Encounter: Payer: Commercial Indemnity | Admitting: Physical Therapy

## 2013-07-23 ENCOUNTER — Other Ambulatory Visit: Payer: Self-pay | Admitting: Obstetrics and Gynecology

## 2013-08-05 ENCOUNTER — Telehealth: Payer: Self-pay | Admitting: *Deleted

## 2013-08-05 ENCOUNTER — Other Ambulatory Visit: Payer: Self-pay | Admitting: *Deleted

## 2013-08-05 DIAGNOSIS — N83209 Unspecified ovarian cyst, unspecified side: Secondary | ICD-10-CM

## 2013-08-05 NOTE — Telephone Encounter (Signed)
CT scan made for pt on 08/13/13 at 1030am,. Called pt, unable to reach. LMOVM to call back to discuss CT Scan appt time/date, any allergies to contrast and if has new or updated insurance.

## 2013-08-07 ENCOUNTER — Telehealth: Payer: Self-pay | Admitting: *Deleted

## 2013-08-07 NOTE — Telephone Encounter (Signed)
Called pt unable to reach, lmovm to call back regarding appt for CT scan 4/23 at 1030. Requesting call back to confirm message rcvd.

## 2013-08-12 ENCOUNTER — Telehealth: Payer: Self-pay | Admitting: *Deleted

## 2013-08-12 NOTE — Telephone Encounter (Signed)
Returned pt's call regarding upcoming appt. Pt voiced concerns with husband not being able to attend appt with pt. Pt voiced she also has an appt with a breast cancer MD at [facility] on wed 4/29/ Pt asked opinion if she should reschedule her appt as she is unsure if she should come by herself. Discussed with pt it would be a good idea to have someone with her at the appt as it's always helpful to have support. Pt advised she will keep appt and come alone.

## 2013-08-13 ENCOUNTER — Ambulatory Visit (HOSPITAL_COMMUNITY): Admission: RE | Admit: 2013-08-13 | Payer: BC Managed Care – PPO | Source: Ambulatory Visit

## 2013-08-17 ENCOUNTER — Telehealth: Payer: Self-pay | Admitting: *Deleted

## 2013-08-17 NOTE — Telephone Encounter (Signed)
Message from [facility] received pt called regarding Fluid in her chest. Called pt who states" I have fluid in my chest which is painful,it's not above my breast some on the side where my armpit is, I had chills this morning." Discussed with pt this could be possible lymph fluid, with concern to pain in chest recommended pt to call her breast surgeon or her breast cancer MD. Pt declined states " I disagree with his opinions. He wanted to leave my ovaries in when my GYN said they needed to come out." Recommended pt be further evaluated in ED or Urgent care as it was concerning that she is having pain in her chest and fluid build up. Pt declined as she did not know if her insurance would pay for it. Discussed again the importance of pt calling a provider who has cared for her in the past and knows her history. Pt said " I will just come in tomorrow and have the Dr. Primitivo Gauze at it". Confirmed pt appt for 4/28 @ 11am. Pt to arrive 13mins prior.

## 2013-08-18 ENCOUNTER — Encounter: Payer: Self-pay | Admitting: Gynecologic Oncology

## 2013-08-18 ENCOUNTER — Ambulatory Visit: Payer: BC Managed Care – PPO | Attending: Gynecologic Oncology | Admitting: Gynecologic Oncology

## 2013-08-18 VITALS — BP 130/76 | HR 88 | Temp 97.9°F | Resp 18 | Ht 68.0 in | Wt 148.0 lb

## 2013-08-18 DIAGNOSIS — C50919 Malignant neoplasm of unspecified site of unspecified female breast: Secondary | ICD-10-CM | POA: Insufficient documentation

## 2013-08-18 NOTE — Patient Instructions (Signed)
Please have Dr. Lindon Romp communicate with [place] regarding their opinion of treatment options.

## 2013-08-18 NOTE — Progress Notes (Signed)
Consult Note: Gyn-Onc  Cynthia Rosario 58 y.o. female  CC:  Chief Complaint  Patient presents with  . Breast Cancer    HPI: Patient is seen today in consultation at the request of Dr. Renelda Loma for post menopausal bleeding. I last saw her in 2013 when she was referred for consideration of a BSO.  Patient is a 58 year old female accompanied today to the clinic by her husband with a history of a node-positive, right-sided hormone receptor-positive, HER2-negative recurrent breast cancer with extensive adenopathy.   HISTORY OF PRESENT ILLNESS:  1. In 2003 diagnosis, stage II left-sided breast cancer at age of 12 and 2 of 54 lymph nodes positive. Tumor was ER positive. Received TAC chemotherapy, local regional radiation from Dr. Luetta Nutting and took tamoxifen for 9 months, discontinuing this with secondary side effects.  2. In 12/2010, the patient was seeing Dr. Truman Hayward for right breast augmentation and pt was noted to have a lesion in the upper outer quadrant than on imaging studies measured 3.3 x 1.8 cm, and a subsequent biopsy on 01/11/2011 showed an invasive ductal cancer with lobular features. It was 100% ER positive, 88% PR positive and HER-2 negative.  3. After discussion, the patient elected to have bilateral mastectomies and had a right skin-sparing mastectomy and sentinel node procedure and then a completion right axillary dissection and removal of her right breast implant. She had a right immediate reconstruction with a tissue expander.  4. Started then on Arimidex and Zoladex because of concerns about persistent ovarian function even though she meets postmenopausal criteria. Gonadotropins in 9/12 c/w menopausal. 5. 12/31/11 D/C Zolodex and continue on Arimidex.   She has a distant history of ovarian cyst was last seen by Dr. Clarene Essex in 2007. It appears that initially in 2003 shows small left ovarian mass measuring up to 3 cm it was partially cystic with features suggesting a dermoid. CA125 was normal. She  then had ultrasounds and MRIs through September of 06 that showed the lesion measured 2 x 2 centimeters and 1.8 x 1.9 cm. Most recently in March of 2007 the cyst was 1.8 x 1.4 x 1.6 cm.   She presented with an episode of spotting after intercourse as well as horseback riding. She underwent ultrasound on 07/23/2013. Your revealed the uterus to be 4.2 x 5.2 x 4.9 cm. There was a simple follicle in the left ovary measuring 1.5 x 1.2 x 1.8 cm. There is no free fluid. In nature biopsy was performed that revealed polypoid fragments of endometrial tissue with involvement by poorly differentiated carcinoma. Based on the cytokeratin staining it was felt to be consistent with infiltrating carcinoma. Per pathology, they favored a breast primary.   Diagnosis Endometrium, biopsy POLYPOID FRAGMENTS OF ENDOMETRIAL TISSUE WITH INVOLVEMENT BY POORLY DIFFERENTIATED CARCINOMA, SEE COMMENT. Microscopic Comment The sections show multiple fragments of polypoid endometrial tissue infiltrated in some foci by clusters and diffuse sheets of atypical epithelioid cells displaying mildly to moderately pleomorphic nuclei, abundant amphophilic to clear cytoplasm and small to inconspicuous nucleoli. Some of the atypical cells display cytoplasmic vacuoles. This is associated with only occasional mitoses. The endometrial glands in the background display lack of atypical features. In addition to polypoid fragments, there are non-polypoid weakly secretory type endometrium with breakdown. To further evaluate the infiltrative process, a large battery of immunohistochemical stains were performed. The atypical infiltrating cells stain as follows: Cytokeratin AE1/AE3 - positive Cytokeratin 7 - positive Cytokeratin 20 - focally positive GCDFP - weakly positive Estrogen receptor - positive Progesterone receptor -  negative p16 - nonspecific background staining. TTF-1 - negative WT-1 - negative CD10 - negative CD68 - negative CDX-2 -  negative Vimentin - negative Smooth muscle actin - negative The immunophenotypic features are consistent with an infiltrating carcinoma. The tumor shows predominant cytokeratin 7 positivity in addition to GCDFP and estrogen receptor positivity. Despite the focal cytokeratin 20 positivity, the overall features strongly favor a breast primary especially given the previously know clinical history of breast carcinoma. Dr. Nicoletta Dress reviewed this case and concurs.   She is a BRCA unknown status. She states it has been recommended to her in the past that she have testing but she has declined.    Interval History:  She has not really been seen by  Dr. Alinda Money her medical oncologist. She has an appointment to be seen as a new patient by Dr. Lindon Romp at [facility] tomorrow. She again reviewed that she had some pelvic pressure when I last saw her in August of 2013 but that has subsequently resolved. Dr. Alinda Money did not feel that she required a bilateral salpingo-oophorectomy and she was ultimately told that she was  postmenopausal based on gonadotropins that were repeated several times. With this episode of bleeding she had 2 days of spotting and bleeding for 5 days. There was some associated cramping. Subsequently resolved. She had a yeast infection when she treated by drinking vinegar. She does occasionally have some constipation which is better since she's been exercising regularly. She does have some low back pain that she notices intermittently and does take ibuprofen. She also uses a heating pad helps at night. 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 She felt like her chest was  "filling up with water" but she felt fine this morning. There are no new medical problems in her family.    Current Meds:  Outpatient Encounter Prescriptions as of 08/18/2013  Medication Sig  . [DISCONTINUED] anastrozole (ARIMIDEX) 1 MG tablet Take 1 mg by mouth daily.  . [DISCONTINUED] Goserelin Acetate (ZOLADEX [place]) Inject into  the skin.    Allergy: No Known Allergies  Social Hx:   History   Social History  . Marital Status: Married    Spouse Name: N/A    Number of Children: N/A  . Years of Education: N/A   Occupational History  . Not on file.   Social History Main Topics  . Smoking status: Former Research scientist (life sciences)  . Smokeless tobacco: Never Used  . Alcohol Use: Yes  . Drug Use: No  . Sexual Activity: Yes    Birth Control/ Protection: Condom   Other Topics Concern  . Not on file   Social History Narrative  . No narrative on file    Past Surgical Hx:  Past Surgical History  Procedure Laterality Date  . Wisdom tooth extraction    . Breast lumpectomy      x2  . Mastectomy      x2  . Reconstruction breast w/ tram flap    . Breast enhancement surgery      Past Medical Hx:  Past Medical History  Diagnosis Date  . Breast cancer   . Cancer     breast  . Dermoid   . Ovarian cyst   . Urinary urgency   . Osteopenia   . Cervical polyp   . Abnormal pap   . Fibroid   . Kidney stones     Oncology Hx:   No history exists.    Family Hx:  Family History  Problem Relation  Age of Onset  . Heart disease Father   . Hypertension Mother     Vitals:  Blood pressure 130/76, pulse 88, temperature 97.9 F (36.6 C), temperature source Oral, resp. rate 18, height 5' 8"  (1.727 m), weight 148 lb (67.132 kg).  Physical Exam: Well-nourished well-developed female in no acute distress.  Neck: Supple, no lymphadenopathy no thyromegaly.  Lungs: Clear to auscultation bilaterally.  Cardiovascular: Tachycardic regular rhythm. No murmur.  Abdomen: Soft, nontender, nondistended. There no palpable masses or hepatosplenomegaly.  Groins: Node lymphadenopathy.  Extremities: No edema.  Pelvic: Normal external female genitalia. The cervix is without lesions. Bimanual examination the cervix is palpably normal. The uterus is of normal size, shape, consistency. There are no adnexal masses. Rectal  confirms.  Assessment/Plan: 58 year old with history of breast cancer who most recently had an endometrial biopsy suggesting metastatic endometrial cancer within the uterus. She's had no breast cancer followup in about 2 years. She is seeing a new medical oncologist Dr. Lindon Romp at [facility] tomorrow. We had originally scheduled her for imaging to evaluate whether or not this was her only site of disease versus there being other metastatic disease. The patient canceled this appointment wishing to seeing a medical oncologist.  I discussed with her that surgery may be indicated in both with regards to treatment of her breast cancer as well as quality of life. He proceed with a hysterectomy I would remove her adnexa at that time. However, if there is widely metastatic disease and may be more prudent for her to proceed with adjuvant therapy for her other disease rather than immediate hysterectomy. At this point and unclear what her disease status is. She understands this. She will see her medical oncologist tomorrow and will defer our treatment based on her consultation with Dr. Lindon Romp. She has a card of mine to give Dr. Lindon Romp so that we can communicate. I will send Dr. Lindon Romp a copy of today's note in advance of her appointment tomorrow. The patient will take the responsibility of scheduling followup.  Dawood Spitler A. Alycia Rossetti, MD 08/18/2013, 11:31 AM

## 2013-09-18 HISTORY — PX: ABDOMINAL HYSTERECTOMY: SHX81

## 2013-11-12 ENCOUNTER — Telehealth: Payer: Self-pay | Admitting: *Deleted

## 2013-11-12 NOTE — Telephone Encounter (Signed)
Pt called regarding appt for post op check.. Pt was unable to make her jul 6 f/u at [facility] with Dr. Alycia Rossetti. Pt given an appt with Dr. Denman George for 7/30 at 1:15pm Pt verbalized and confirmed appt date/time

## 2013-11-19 ENCOUNTER — Encounter: Payer: Self-pay | Admitting: Gynecologic Oncology

## 2013-11-19 ENCOUNTER — Ambulatory Visit: Payer: BC Managed Care – PPO | Attending: Gynecologic Oncology | Admitting: Gynecologic Oncology

## 2013-11-19 VITALS — BP 156/80 | HR 70 | Temp 97.7°F | Resp 18 | Ht 68.5 in | Wt 144.1 lb

## 2013-11-19 DIAGNOSIS — M899 Disorder of bone, unspecified: Secondary | ICD-10-CM | POA: Insufficient documentation

## 2013-11-19 DIAGNOSIS — Z09 Encounter for follow-up examination after completed treatment for conditions other than malignant neoplasm: Secondary | ICD-10-CM | POA: Insufficient documentation

## 2013-11-19 DIAGNOSIS — C7981 Secondary malignant neoplasm of breast: Secondary | ICD-10-CM | POA: Insufficient documentation

## 2013-11-19 DIAGNOSIS — Z853 Personal history of malignant neoplasm of breast: Secondary | ICD-10-CM | POA: Insufficient documentation

## 2013-11-19 DIAGNOSIS — Z90710 Acquired absence of both cervix and uterus: Secondary | ICD-10-CM | POA: Insufficient documentation

## 2013-11-19 DIAGNOSIS — C50919 Malignant neoplasm of unspecified site of unspecified female breast: Secondary | ICD-10-CM

## 2013-11-19 DIAGNOSIS — Z87891 Personal history of nicotine dependence: Secondary | ICD-10-CM | POA: Insufficient documentation

## 2013-11-19 DIAGNOSIS — Z9079 Acquired absence of other genital organ(s): Secondary | ICD-10-CM | POA: Insufficient documentation

## 2013-11-19 DIAGNOSIS — Z9010 Acquired absence of unspecified breast and nipple: Secondary | ICD-10-CM | POA: Insufficient documentation

## 2013-11-19 DIAGNOSIS — M949 Disorder of cartilage, unspecified: Secondary | ICD-10-CM

## 2013-11-19 NOTE — Progress Notes (Signed)
Consult Note: Gyn-Onc  Cynthia Rosario 58 y.o. female  CC:  Chief Complaint  Patient presents with  . Routine Post Op  8 weeks s/p robotic hyst/bso.  HPI: Patient is seen today for a postop visit approximately 8 weeks s/p robotichyst, BSO for recurrent breast cancer. Surgery was performed at [facility] by Dr Alycia Rossetti on 09/18/13. It was an uncomplicated surgery. Pathology revealed recurrent hormone receptor positive cancer in her fallopian tubes and ovaries and myometrium.   Patient has a history of a node-positive, right-sided hormone receptor-positive, HER2-negative recurrent breast cancer with extensive adenopathy.   HISTORY OF PRESENT ILLNESS:  1. In 2003 diagnosis, stage II left-sided breast cancer at age of 25 and 2 of 47 lymph nodes positive. Tumor was ER positive. Received TAC chemotherapy, local regional radiation from Dr. Luetta Nutting and took tamoxifen for 9 months, discontinuing this with secondary side effects.  2. In 12/2010, the patient was seeing Dr. Truman Hayward for right breast augmentation and pt was noted to have a lesion in the upper outer quadrant than on imaging studies measured 3.3 x 1.8 cm, and a subsequent biopsy on 01/11/2011 showed an invasive ductal cancer with lobular features. It was 100% ER positive, 88% PR positive and HER-2 negative.  3. After discussion, the patient elected to have bilateral mastectomies and had a right skin-sparing mastectomy and sentinel node procedure and then a completion right axillary dissection and removal of her right breast implant. She had a right immediate reconstruction with a tissue expander.  4. Started then on Arimidex and Zoladex because of concerns about persistent ovarian function even though she meets postmenopausal criteria. Gonadotropins in 9/12 c/w menopausal. 5. 12/31/11 D/C Zolodex and continue on Arimidex.   6. She has a distant history of ovarian cyst was last seen by Dr. Clarene Essex in 2007. It appears that initially in 2003 shows small left ovarian  mass measuring up to 3 cm it was partially cystic with features suggesting a dermoid. CA125 was normal. She then had ultrasounds and MRIs through September of 06 that showed the lesion measured 2 x 2 centimeters and 1.8 x 1.9 cm. Most recently in March of 2007 the cyst was 1.8 x 1.4 x 1.6 cm.   7. She presented with an episode of spotting after intercourse as well as horseback riding. She underwent ultrasound on 07/23/2013. This revealed the uterus to be 4.2 x 5.2 x 4.9 cm. There was a simple follicle in the left ovary measuring 1.5 x 1.2 x 1.8 cm. There is no free fluid. In nature biopsy was performed that revealed polypoid fragments of endometrial tissue with involvement by poorly differentiated carcinoma. Based on the cytokeratin staining it was felt to be consistent with infiltrating carcinoma. Per pathology, they favored a breast primary.   8. She under went surgery on 09/18/13 and pathology revealed metastatic breast cancer (see above).  9. She was started on Femara in the past month, however stopped this when she began experience hair loss (patchy allopecia).  Diagnosis Endometrium, biopsy POLYPOID FRAGMENTS OF ENDOMETRIAL TISSUE WITH INVOLVEMENT BY POORLY DIFFERENTIATED CARCINOMA, SEE COMMENT. Microscopic Comment The sections show multiple fragments of polypoid endometrial tissue infiltrated in some foci by clusters and diffuse sheets of atypical epithelioid cells displaying mildly to moderately pleomorphic nuclei, abundant amphophilic to clear cytoplasm and small to inconspicuous nucleoli. Some of the atypical cells display cytoplasmic vacuoles. This is associated with only occasional mitoses. The endometrial glands in the background display lack of atypical features. In addition to polypoid fragments, there are  non-polypoid weakly secretory type endometrium with breakdown. To further evaluate the infiltrative process, a large battery of immunohistochemical stains were performed. The atypical  infiltrating cells stain as follows: Cytokeratin AE1/AE3 - positive Cytokeratin 7 - positive Cytokeratin 20 - focally positive GCDFP - weakly positive Estrogen receptor - positive Progesterone receptor - negative p16 - nonspecific background staining. TTF-1 - negative WT-1 - negative CD10 - negative CD68 - negative CDX-2 - negative Vimentin - negative Smooth muscle actin - negative The immunophenotypic features are consistent with an infiltrating carcinoma. The tumor shows predominant cytokeratin 7 positivity in addition to GCDFP and estrogen receptor positivity. Despite the focal cytokeratin 20 positivity, the overall features strongly favor a breast primary especially given the previously know clinical history of breast carcinoma. Dr. Nicoletta Dress reviewed this case and concurs.   She is a BRCA unknown status. She states it has been recommended to her in the past that she have testing but she has declined.    Interval History:  She has had no intervening issues since surgery. She has had no new menopausal symptoms.She has not yet resumed intercourse.    Current Meds:  Outpatient Encounter Prescriptions as of 11/19/2013  Medication Sig  . docusate sodium (STOOL SOFTENER) 100 MG capsule Take 100 mg by mouth.  Marland Kitchen ibuprofen (ADVIL,MOTRIN) 200 MG tablet Take 200 mg by mouth.  Marland Kitchen ibuprofen (ADVIL,MOTRIN) 600 MG tablet Take 600 mg by mouth.  Marland Kitchen ibuprofen (ADVIL,MOTRIN) 600 MG tablet   . letrozole (FEMARA) 2.5 MG tablet   . LORazepam (ATIVAN) 1 MG tablet Take 1 mg by mouth.  Marland Kitchen LORazepam (ATIVAN) 1 MG tablet   . oxyCODONE-acetaminophen (PERCOCET/ROXICET) 5-325 MG per tablet Take by mouth.    Allergy: No Known Allergies  Social Hx:   History   Social History  . Marital Status: Married    Spouse Name: N/A    Number of Children: N/A  . Years of Education: N/A   Occupational History  . Not on file.   Social History Main Topics  . Smoking status: Former Research scientist (life sciences)  . Smokeless tobacco: Never  Used  . Alcohol Use: Yes  . Drug Use: No  . Sexual Activity: Yes    Birth Control/ Protection: Condom   Other Topics Concern  . Not on file   Social History Narrative  . No narrative on file    Past Surgical Hx:  Past Surgical History  Procedure Laterality Date  . Wisdom tooth extraction    . Breast lumpectomy      x2  . Mastectomy      x2  . Reconstruction breast w/ tram flap    . Breast enhancement surgery    . Abdominal hysterectomy  09/18/13    robotic hyst, bso for metastatic breast cancer, Dr Alycia Rossetti at [facility].    Past Medical Hx:  Past Medical History  Diagnosis Date  . Breast cancer   . Cancer     breast  . Dermoid   . Ovarian cyst   . Urinary urgency   . Osteopenia   . Cervical polyp   . Abnormal pap   . Fibroid   . Kidney stones     Oncology Hx:   No history exists.    Family Hx:  Family History  Problem Relation Age of Onset  . Heart disease Father   . Hypertension Mother     Vitals:  Blood pressure 156/80, pulse 70, temperature 97.7 F (36.5 C), temperature source Oral, resp. rate 18, height 5' 8.5" (  1.74 m), weight 144 lb 1.6 oz (65.363 kg).  Physical Exam: Well-nourished well-developed female in no acute distress.  Neck: Supple, no lymphadenopathy no thyromegaly.  Lungs: Clear to auscultation bilaterally.  Cardiovascular: Tachycardic regular rhythm. No murmur.  Abdomen: Soft, nontender, nondistended. There no palpable masses or hepatosplenomegaly.  Groins: Node lymphadenopathy.  Extremities: No edema.  Pelvic: Normal external female genitalia. The cervix and uterus are surgically absent. The vaginal cuff is intact and well healed on visual exam and palpation.  Assessment/Plan: 58 year old with history of breast cancer who has recurrent metastatic breast cancer. She is 8 weeks s/p hysterectomy and BSO and has no issues postop. She has healed well. She has all restrictions lifted. We discussed diet and sexuality and return to sexual  activity. We discussed non-hormonal management of menopausal symptoms.  She does not require additional gyn onc followup, particularly while on active therapy for breast cancer. I have recommended she discuss alternatives to the Femara with her medical oncologist.  Donaciano Eva, MD 11/19/2013, 4:40 PM

## 2013-12-30 ENCOUNTER — Telehealth: Payer: Self-pay | Admitting: *Deleted

## 2013-12-30 NOTE — Telephone Encounter (Signed)
Pt called stating that she was a pt of Dr. Julien Girt and I looked and the last time she was seen was back in 2012, so she will be a new pt.  She has been seen at [facility] and wants to come back here for a second opinion for her breast cancer diagnosis.  Confirmed 01/18/14 appt w/ pt.  She had questions about the insurance, so I emailed Lenise and Raquel for one of them to reach out to her for f/u with those questions.

## 2014-01-04 ENCOUNTER — Telehealth: Payer: Self-pay | Admitting: *Deleted

## 2014-01-04 NOTE — Telephone Encounter (Signed)
Copy of pt's chart given to Arsenio Loader for new pt appt Breast clinic

## 2014-01-18 ENCOUNTER — Encounter: Payer: Self-pay | Admitting: Hematology and Oncology

## 2014-01-18 ENCOUNTER — Encounter (INDEPENDENT_AMBULATORY_CARE_PROVIDER_SITE_OTHER): Payer: Self-pay

## 2014-01-18 ENCOUNTER — Ambulatory Visit (HOSPITAL_BASED_OUTPATIENT_CLINIC_OR_DEPARTMENT_OTHER): Payer: BC Managed Care – PPO | Admitting: Hematology and Oncology

## 2014-01-18 ENCOUNTER — Ambulatory Visit (HOSPITAL_BASED_OUTPATIENT_CLINIC_OR_DEPARTMENT_OTHER): Payer: BC Managed Care – PPO

## 2014-01-18 VITALS — BP 135/71 | HR 68 | Temp 97.7°F | Resp 20 | Ht 68.5 in | Wt 139.9 lb

## 2014-01-18 DIAGNOSIS — C50412 Malignant neoplasm of upper-outer quadrant of left female breast: Secondary | ICD-10-CM

## 2014-01-18 DIAGNOSIS — Z170 Estrogen receptor positive status [ER+]: Secondary | ICD-10-CM

## 2014-01-18 DIAGNOSIS — C50419 Malignant neoplasm of upper-outer quadrant of unspecified female breast: Secondary | ICD-10-CM

## 2014-01-18 NOTE — Assessment & Plan Note (Signed)
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 If she could not tolerate Femara we could either consider giving her exemestane on tamoxifen. I discussed the risks and benefits of adding Palbociclib to antiestrogen therapy. Patient had numerous questions regarding the differences between tamoxifen and aromatase inhibitors as well as the differences between different aromatase inhibitor drugs. We spent over 60 minutes discussing these issues. I also discussed second and third line therapy options including antiestrogen therapy with everolimus and Faslodex as well.  I recommended that it is essential for her to get started on antiestrogen therapy as soon as possible in order to prevent the metastatic disease from getting any worse. She understands the goals of treatment are palliation and to prolong her life. I also discussed with her that if there were any clinical trial options available that would be the best way to take care of her cancer. She plans to go to [place] and to meet with her oncologist as well.  We're happy to take care of her if she decides to come to [place]. Patient knows to call us back regarding her decision.

## 2014-01-18 NOTE — Progress Notes (Signed)
Checked in new patient with no financial issues prior to seeing the dr. She has her appt card and i gave her Lenise's card if any issues arise and she needs asst.

## 2014-01-18 NOTE — Progress Notes (Signed)
[place] CONSULT NOTE  Patient Care Team: [name], MD as PCP - General (Family Medicine)  CHIEF COMPLAINTS/PURPOSE OF CONSULTATION:  Metastatic breast cancer  HISTORY OF PRESENTING ILLNESS:  Cynthia Rosario 58 y.o. Caucasian female is here because of recent diagnosis of metastatic breast cancer. She was originally diagnosed in 2003 with left-sided breast cancer and she underwent left mastectomy but then developed a recurrence in the right breast in 2012 and underwent right-sided mastectomy at that time she had 36 lymph nodes that were involved but she did not receive any adjuvant chemotherapy. She did receive radiation therapy followed by Arimidex for a very short duration along with Zoladex for 5 months. Most recently because of abdominal symptoms she underwent evaluation including the biopsy of the uterus which came back as positive for estrogen receptor positive breast cancer. She then underwent a hysterectomy and bilateral salpingo-oophorectomy which revealed metastatic breast cancer involving the ovaries fallopian tubes and uterus. Patient was told that there was no involvement of the peritoneum.   She had been going to different doctors trying to get opinions. She took Femara for a few days and developed alopecia and she stopped taking it. She has not resumed any antiestrogen therapy since.  I reviewed her records extensively and collaborated the history with the patient.  SUMMARY OF ONCOLOGIC HISTORY:   Breast cancer of upper-outer quadrant of left female breast   01/18/2002 Initial Diagnosis Breast cancer of upper-outer quadrant of left female breast; left mastectomy 2/26 lymph nodes positive ER positive received adjuvant TAC chemotherapy followed by radiation and tamoxifen for 9 months stopped for side effects   01/19/2011 Relapse/Recurrence Right breast augmentation surgery was attempted by Dr. Truman Hayward and was found to have a lesion in the upper outer quadrant 3.3 x  1.8 cm biopsy revealed invasive ductal carcinoma with lobular features ER 100% PR 88% HER-2 negative   02/06/2011 Surgery Right breast mastectomy at [facility] and axillary lymph node dissection with removal of breast implant followed by immediate reconstruction with tissue expander 36 lymph nodes were positive received radiation therapy and no chemotherapy   03/21/2011 - 04/20/2011 Radiation Therapy Radiation to chest wall and axilla   05/29/2011 - 12/31/2011 Anti-estrogen oral therapy Patient received Arimidex for one week and Zoladex x5 months stop Arimidex due to side effects   07/23/2013 Relapse/Recurrence 2 years of back pain and abdominal symptoms with spotting after intercourse revealed uterus enlarged biopsy revealed metastatic breast cancer lobular type: ER positive PR negative   09/18/2013 Surgery Hysterectomy and bilateral salpingo-oophorectomy at [facility] revealed metastatic breast cancer involving tubes and uterus   10/13/2013 -  Anti-estrogen oral therapy Femara for 30 days stopped for patchy alopecia and patient going for second and third opinions between [place], [place] regarding the appropriate antiestrogen plan    MEDICAL HISTORY:  Past Medical History  Diagnosis Date  . Breast cancer   . Cancer     breast  . Dermoid   . Ovarian cyst   . Urinary urgency   . Osteopenia   . Cervical polyp   . Abnormal pap   . Fibroid   . Kidney stones     SURGICAL HISTORY: Past Surgical History  Procedure Laterality Date  . Wisdom tooth extraction    . Breast lumpectomy      x2  . Mastectomy      x2  . Reconstruction breast w/ tram flap    . Breast enhancement surgery    . Abdominal hysterectomy  09/18/13    robotic hyst, bso for metastatic breast cancer, Dr Alycia Rossetti at [facility].    SOCIAL HISTORY: History   Social History  . Marital Status: Married    Spouse Name: N/A    Number of Children: N/A  . Years of Education: N/A   Occupational History  . Not on file.   Social History Main  Topics  . Smoking status: Former Research scientist (life sciences)  . Smokeless tobacco: Never Used  . Alcohol Use: No  . Drug Use: No  . Sexual Activity: Yes    Birth Control/ Protection: Condom   Other Topics Concern  . Not on file   Social History Narrative  . No narrative on file    FAMILY HISTORY: Family History  Problem Relation Age of Onset  . Heart disease Father   . Hypertension Mother     ALLERGIES:  has No Known Allergies.  MEDICATIONS:  Current Outpatient Prescriptions  Medication Sig Dispense Refill  . ibuprofen (ADVIL,MOTRIN) 200 MG tablet Take 200 mg by mouth.      Marland Kitchen LORazepam (ATIVAN) 1 MG tablet Take 1 mg by mouth.      Marland Kitchen aspirin EC 81 MG tablet Take by mouth.      . Calcium Carbonate-Vit D-Min (CALCIUM 600+D PLUS MINERALS) 600-400 MG-UNIT TABS Take by mouth.      . Multiple Vitamins tablet Take 1 tablet by mouth.       No current facility-administered medications for this visit.    REVIEW OF SYSTEMS:   Constitutional: Denies fevers, chills or abnormal night sweats Eyes: Denies blurriness of vision, double vision or watery eyes Ears, nose, mouth, throat, and face: Denies mucositis or sore throat, alopecia patchy Respiratory: Denies cough, dyspnea or wheezes Cardiovascular: Denies palpitation, chest discomfort or lower extremity swelling Gastrointestinal:  Denies nausea, heartburn or change in bowel habits Skin: Denies abnormal skin rashes Lymphatics: Denies new lymphadenopathy or easy bruising Neurological:Denies numbness, tingling or new weaknesses Behavioral/Psych: Mood is stable, no new changes  All other systems were reviewed with the patient and are negative.  PHYSICAL EXAMINATION: ECOG PERFORMANCE STATUS: 1 - Symptomatic but completely ambulatory  Filed Vitals:   01/18/14 1256  BP: 135/71  Pulse: 68  Temp: 97.7 F (36.5 C)  Resp: 20   Filed Weights   01/18/14 1256  Weight: 139 lb 14.4 oz (63.458 kg)    GENERAL:alert, no distress and comfortable SKIN: skin  color, texture, turgor are normal, no rashes or significant lesions EYES: normal, conjunctiva are pink and non-injected, sclera clear OROPHARYNX:no exudate, no erythema and lips, buccal mucosa, and tongue normal  NECK: supple, thyroid normal size, non-tender, without nodularity LYMPH:  no palpable lymphadenopathy in the cervical, axillary or inguinal LUNGS: clear to auscultation and percussion with normal breathing effort HEART: regular rate & rhythm and no murmurs and no lower extremity edema ABDOMEN:abdomen soft, non-tender and normal bowel sounds Musculoskeletal:no cyanosis of digits and no clubbing  PSYCH: alert & oriented x 3 with fluent speech NEURO: no focal motor/sensory deficits  LABORATORY DATA:  I have reviewed the data as listed Lab Results  Component Value Date   WBC 4.8 01/18/2010   HGB 13.4 01/18/2010   HCT 38.4 01/18/2010   MCV 90.8 01/18/2010   PLT 223 01/18/2010   Lab Results  Component Value Date   NA 143 01/18/2010   K 3.9 01/18/2010   CL 106 01/18/2010   CO2 30 01/18/2010   ASSESSMENT AND PLAN:  Breast cancer of upper-outer quadrant of left female  breast 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 If she could not tolerate Femara we could either consider giving her exemestane on tamoxifen. I discussed the risks and benefits of adding Palbociclib to antiestrogen therapy. Patient had numerous questions regarding the differences between tamoxifen and aromatase inhibitors as well as the differences between different aromatase inhibitor drugs. We spent over 60 minutes discussing these issues. I also discussed second and third line therapy options including antiestrogen therapy with everolimus and Faslodex as well.  I recommended that it is essential for her to get started on antiestrogen therapy as soon as possible in order to prevent the metastatic disease  from getting any worse. She understands the goals of treatment are palliation and to prolong her life. I also discussed with her that if there were any clinical trial options available that would be the best way to take care of her cancer. She plans to go to [place] and to meet with her oncologist as well.  We're happy to take care of her if she decides to come to [place]. Patient knows to call us back regarding her decision.   All questions were answered. The patient knows to call the clinic with any problems, questions or concerns. I spent 55 minutes counseling the patient face to face. The total time spent in the appointment was 60 minutes and more than 50% was on counseling.     Rulon Eisenmenger, MD 01/18/2014 5:23 PM

## 2014-01-25 ENCOUNTER — Telehealth: Payer: Self-pay

## 2014-01-25 NOTE — Telephone Encounter (Signed)
Surgical pathology results rcvd from [facility] 09/23/13.  Copy to Dr Lindi Adie.  Original to scan.

## 2014-02-05 ENCOUNTER — Other Ambulatory Visit: Payer: Self-pay

## 2014-02-15 ENCOUNTER — Telehealth: Payer: Self-pay | Admitting: *Deleted

## 2014-02-15 NOTE — Telephone Encounter (Signed)
Received call from pt stating that she saw Dr. Lindi Adie for a 2nd opinion & would like to continue to see him.  She states that she has an appt for a CT scan 03/10/14 @ [facility] & would like to know if she could get that moved to [place].  Note to Dr Lindi Adie.  Pt informed that we would call her back.

## 2014-02-17 ENCOUNTER — Telehealth: Payer: Self-pay | Admitting: Hematology and Oncology

## 2014-02-17 ENCOUNTER — Other Ambulatory Visit: Payer: Self-pay | Admitting: *Deleted

## 2014-02-17 ENCOUNTER — Telehealth: Payer: Self-pay | Admitting: *Deleted

## 2014-02-17 NOTE — Telephone Encounter (Signed)
Left VMM letting patient know that CT scan will be scheduled here and follow-up with MD after scan obtained.

## 2014-02-17 NOTE — Telephone Encounter (Signed)
, °

## 2014-02-18 ENCOUNTER — Telehealth: Payer: Self-pay | Admitting: Hematology and Oncology

## 2014-02-22 ENCOUNTER — Encounter: Payer: Self-pay | Admitting: Hematology and Oncology

## 2014-02-23 ENCOUNTER — Encounter: Payer: Self-pay | Admitting: Hematology and Oncology

## 2014-03-15 ENCOUNTER — Telehealth: Payer: Self-pay | Admitting: Hematology and Oncology

## 2014-03-15 ENCOUNTER — Ambulatory Visit (HOSPITAL_BASED_OUTPATIENT_CLINIC_OR_DEPARTMENT_OTHER): Payer: BC Managed Care – PPO | Admitting: Hematology and Oncology

## 2014-03-15 VITALS — BP 135/84 | HR 82 | Temp 97.6°F | Resp 18 | Ht 68.5 in | Wt 137.9 lb

## 2014-03-15 DIAGNOSIS — C50412 Malignant neoplasm of upper-outer quadrant of left female breast: Secondary | ICD-10-CM

## 2014-03-15 DIAGNOSIS — C7951 Secondary malignant neoplasm of bone: Secondary | ICD-10-CM

## 2014-03-15 DIAGNOSIS — C7960 Secondary malignant neoplasm of unspecified ovary: Secondary | ICD-10-CM

## 2014-03-15 DIAGNOSIS — Z170 Estrogen receptor positive status [ER+]: Secondary | ICD-10-CM

## 2014-03-15 MED ORDER — ANASTROZOLE 1 MG PO TABS: 1.0000 mg | ORAL_TABLET | Freq: Every day | ORAL | Status: DC

## 2014-03-15 NOTE — Telephone Encounter (Signed)
, °

## 2014-03-15 NOTE — Progress Notes (Signed)
Patient Care Team: [name], MD as PCP - General (Family Medicine)  DIAGNOSIS: No matching staging information was found for the patient.  SUMMARY OF ONCOLOGIC HISTORY:   Breast cancer of upper-outer quadrant of left female breast   01/18/2002 Initial Diagnosis Breast cancer of upper-outer quadrant of left female breast; left mastectomy 2/26 lymph nodes positive ER positive received adjuvant TAC chemotherapy followed by radiation and tamoxifen for 9 months stopped for side effects   01/19/2011 Relapse/Recurrence Right breast augmentation surgery was attempted by Dr. Truman Hayward and was found to have a lesion in the upper outer quadrant 3.3 x 1.8 cm biopsy revealed invasive ductal carcinoma with lobular features ER 100% PR 88% HER-2 negative   02/06/2011 Surgery Right breast mastectomy at [facility] and axillary lymph node dissection with removal of breast implant followed by immediate reconstruction with tissue expander 36 lymph nodes were positive received radiation therapy and no chemotherapy   03/21/2011 - 04/20/2011 Radiation Therapy Radiation to chest wall and axilla   05/29/2011 - 12/31/2011 Anti-estrogen oral therapy Patient received Arimidex for one week and Zoladex x5 months stop Arimidex due to side effects   07/23/2013 Relapse/Recurrence 2 years of back pain and abdominal symptoms with spotting after intercourse revealed uterus enlarged biopsy revealed metastatic breast cancer lobular type: ER positive PR negative   09/18/2013 Surgery Hysterectomy and bilateral salpingo-oophorectomy at [facility] revealed metastatic breast cancer involving tubes and uterus   10/13/2013 -  Anti-estrogen oral therapy Femara for 30 days stopped for patchy alopecia and patient going for second and third opinions between [place], [place], [place] regarding the appropriate antiestrogen plan    CHIEF COMPLIANT: followup of metastatic breast cancer  INTERVAL HISTORY: Cynthia Rosario is a 58 year old Caucasian with above-mentioned  history of metastatic invasive lobular cancer. She had metastatic disease involving the uterus and ovaries that were removed in May 2015. She went on oral antiestrogen therapy briefly and stopped it because of alopecia issues. She only took it for 10 days. In spite of being off antiestrogen therapy, her hair loss continue to be present. She has seen dermatologist for this who prescribed steroid drops. For the metastatic breast cancer I recommended stopping antiestrogen therapy. She had received second opinion at [facility] and at [facility]. She is back here to initiate therapy. She had CT scans of chest abdomen pelvis as well as bone scan and [facility] recently.  REVIEW OF SYSTEMS:   Constitutional: Denies fevers, chills or abnormal weight loss Eyes: Denies blurriness of vision Ears, nose, mouth, throat, and face: Denies mucositis or sore throat Respiratory: Denies cough, dyspnea or wheezes Cardiovascular: Denies palpitation, chest discomfort or lower extremity swelling Gastrointestinal:  Denies nausea, heartburn or change in bowel habits Skin: Denies abnormal skin rashes Lymphatics: Denies new lymphadenopathy or easy bruising Neurological:Denies numbness, tingling or new weaknesses Behavioral/Psych: Mood is stable, no new changes  All other systems were reviewed with the patient and are negative.  I have reviewed the past medical history, past surgical history, social history and family history with the patient and they are unchanged from previous note.  ALLERGIES:  has No Known Allergies.  MEDICATIONS:  Current Outpatient Prescriptions  Medication Sig Dispense Refill  . anastrozole (ARIMIDEX) 1 MG tablet Take 1 tablet (1 mg total) by mouth daily. 90 tablet 3  . aspirin EC 81 MG tablet Take by mouth.    . Calcium Carbonate-Vit D-Min (CALCIUM 600+D PLUS MINERALS) 600-400 MG-UNIT TABS Take by mouth.    Marland Kitchen ibuprofen (ADVIL,MOTRIN) 200 MG  tablet Take 200 mg by mouth.    Marland Kitchen LORazepam (ATIVAN) 1 MG  tablet Take 1 mg by mouth.    . Multiple Vitamins tablet Take 1 tablet by mouth.     No current facility-administered medications for this visit.    PHYSICAL EXAMINATION: ECOG PERFORMANCE STATUS: 1 - Symptomatic but completely ambulatory  Filed Vitals:   03/15/14 1019  BP: 135/84  Pulse: 82  Temp: 97.6 F (36.4 C)  Resp: 18   Filed Weights   03/15/14 1019  Weight: 137 lb 14.4 oz (62.551 kg)    GENERAL:alert, no distress and comfortable SKIN: skin color, texture, turgor are normal, no rashes or significant lesions EYES: normal, Conjunctiva are pink and non-injected, sclera clear OROPHARYNX:no exudate, no erythema and lips, buccal mucosa, and tongue normal  NECK: supple, thyroid normal size, non-tender, without nodularity LYMPH:  no palpable lymphadenopathy in the cervical, axillary or inguinal LUNGS: clear to auscultation and percussion with normal breathing effort HEART: regular rate & rhythm and no murmurs and no lower extremity edema ABDOMEN:abdomen soft, non-tender and normal bowel sounds Musculoskeletal:no cyanosis of digits and no clubbing  NEURO: alert & oriented x 3 with fluent speech, no focal motor/sensory deficits  LABORATORY DATA:  I have reviewed the data as listed   Chemistry      Component Value Date/Time   NA 143 01/18/2010 1201   K 3.9 01/18/2010 1201   CL 106 01/18/2010 1201   CO2 30 01/18/2010 1201   BUN 14 01/18/2010 1201   CREATININE 0.77 01/18/2010 1201      Component Value Date/Time   CALCIUM 9.8 01/18/2010 1201   ALKPHOS 74 01/18/2010 1201   AST 19 01/18/2010 1201   ALT 13 01/18/2010 1201   BILITOT 0.6 01/18/2010 1201       Lab Results  Component Value Date   WBC 4.8 01/18/2010   HGB 13.4 01/18/2010   HCT 38.4 01/18/2010   MCV 90.8 01/18/2010   PLT 223 01/18/2010   NEUTROABS 2.4 01/18/2010     RADIOGRAPHIC STUDIES: I have personally reviewed the radiology reports and agreed with their findings. CT chest abdomen pelvis and  bone scan done at [facility] were reviewed with the patient. 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  Patient had second opinions at [facility], [facility] and at [facility] and she is here today to discuss her final treatment decision. Treatment plan: Anastrozole 1 mg daily along with Xgeva once every 3 months.  Bone metastases:I reviewed the CT chest abdomen pelvis and bone scan done at [facility]. The bone scan revealed lesions involving the T2-T7 right sixth rib and L5. The CT scans showed some scarring tiny lung nodule and bone metastases all of these bone metastases are felt to be stable from before. Continue with calcium vitamin D along with Xgeva every 3 months.  Alopecia:patient was prescribed steroid solution for the hair loss or alopecia areata. I will check selenium and zinc levels along with vitamin D with the next blood work. Return to clinic in one month for blood work and to evaluate tolerance to anastrozole as well as to receive Xgeva injection.   Orders Placed This Encounter  Procedures  . Selenium serum    Standing Status: Future  Number of Occurrences:      Standing Expiration Date: 03/15/2015  . Zinc  . Vitamin D 1,25 dihydroxy    Standing Status: Standing     Number of Occurrences: 1     Standing Expiration Date:    The patient has a good understanding of the overall plan. she agrees with it. She will call with any problems that may develop before her next visit here.   Rulon Eisenmenger, MD 03/15/2014 12:19 PM

## 2014-03-15 NOTE — Assessment & Plan Note (Addendum)
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  Patient had second opinions at [facility], [facility] and at [facility] and she is here today to discuss her final treatment decision. Treatment plan: Anastrozole 1 mg daily along with Xgeva once every 3 months.  Bone metastases:I reviewed the CT chest abdomen pelvis and bone scan done at [facility]. The bone scan revealed lesions involving the T2-T7 right sixth rib and L5. The CT scans showed some scarring tiny lung nodule and bone metastases all of these bone metastases are felt to be stable from before. Continue with calcium vitamin D along with Xgeva every 3 months.  Alopecia:patient was prescribed steroid solution for the hair loss or alopecia areata. I will check selenium and zinc levels along with vitamin D with the next blood work. Return to clinic in one month for blood work and to evaluate tolerance to anastrozole as well as to receive Xgeva injection.

## 2014-04-12 ENCOUNTER — Ambulatory Visit: Payer: BC Managed Care – PPO

## 2014-04-12 ENCOUNTER — Telehealth: Payer: Self-pay | Admitting: Hematology and Oncology

## 2014-04-12 ENCOUNTER — Other Ambulatory Visit: Payer: Self-pay | Admitting: Hematology and Oncology

## 2014-04-12 ENCOUNTER — Ambulatory Visit (HOSPITAL_BASED_OUTPATIENT_CLINIC_OR_DEPARTMENT_OTHER): Payer: BC Managed Care – PPO | Admitting: Hematology and Oncology

## 2014-04-12 ENCOUNTER — Telehealth: Payer: Self-pay | Admitting: *Deleted

## 2014-04-12 ENCOUNTER — Ambulatory Visit (HOSPITAL_BASED_OUTPATIENT_CLINIC_OR_DEPARTMENT_OTHER): Payer: BC Managed Care – PPO | Admitting: Lab

## 2014-04-12 ENCOUNTER — Other Ambulatory Visit: Payer: Self-pay | Admitting: *Deleted

## 2014-04-12 DIAGNOSIS — C7989 Secondary malignant neoplasm of other specified sites: Secondary | ICD-10-CM

## 2014-04-12 DIAGNOSIS — C50412 Malignant neoplasm of upper-outer quadrant of left female breast: Secondary | ICD-10-CM

## 2014-04-12 DIAGNOSIS — C7951 Secondary malignant neoplasm of bone: Secondary | ICD-10-CM

## 2014-04-12 DIAGNOSIS — Z170 Estrogen receptor positive status [ER+]: Secondary | ICD-10-CM

## 2014-04-12 LAB — COMPREHENSIVE METABOLIC PANEL (CC13)
ALT: 20 U/L (ref 0–55)
ANION GAP: 9 meq/L (ref 3–11)
AST: 17 U/L (ref 5–34)
Albumin: 4.4 g/dL (ref 3.5–5.0)
Alkaline Phosphatase: 57 U/L (ref 40–150)
BILIRUBIN TOTAL: 0.56 mg/dL (ref 0.20–1.20)
BUN: 11.9 mg/dL (ref 7.0–26.0)
CO2: 29 mEq/L (ref 22–29)
CREATININE: 0.7 mg/dL (ref 0.6–1.1)
Calcium: 9.9 mg/dL (ref 8.4–10.4)
Chloride: 105 mEq/L (ref 98–109)
GLUCOSE: 77 mg/dL (ref 70–140)
Potassium: 4.9 mEq/L (ref 3.5–5.1)
Sodium: 142 mEq/L (ref 136–145)
Total Protein: 7 g/dL (ref 6.4–8.3)

## 2014-04-12 MED ORDER — DENOSUMAB 120 MG/1.7ML ~~LOC~~ SOLN: 120.0000 mg | Freq: Once | SUBCUTANEOUS | Status: DC

## 2014-04-12 NOTE — Telephone Encounter (Signed)
Pt saw Dr. Lindi Adie today prior to receiving Xgeva.  Per pt, she had received Xgeva x 3 at [place] stated she had good dental cleaning recently.  Pt stated she also had noted mild jaw pain - a month after the injection but the pain did not last long.   Pt was not sure if it was true jaw pain or not.  Dr. Lindi Adie notified of above info.  Proceed with Xgeva as ordered since md was aware of pt's pain.  Consent signed for Delton See today.

## 2014-04-12 NOTE — Assessment & Plan Note (Signed)
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  Patient had second opinions at [facility], [facility] and at [facility] and she is here today to discuss her final treatment decision.  Treatment: Anastrozole 1 mg daily along with Xgeva once every 3 months.  Anastrozole toxicities:  1. Hot flashes 2.  back pain and right hip and pelvic pain : Will obtain a pelvic MRI to evaluate this further if it is arthritis or metastatic disease  Ventral hernia: Will prescribe an abdominal binder. This is related to prior TRAM flap   Bone metastases: Lesions involving T2-T7, right rib and L5: Currently on Xgeva every 3 months and calcium and vitamin D  Alopecia: Stable to improved

## 2014-04-12 NOTE — Patient Instructions (Signed)
Denosumab injection What is this medicine? DENOSUMAB (den oh sue mab) slows bone breakdown. Prolia is used to treat osteoporosis in women after menopause and in men. Xgeva is used to prevent bone fractures and other bone problems caused by cancer bone metastases. Xgeva is also used to treat giant cell tumor of the bone. This medicine may be used for other purposes; ask your health care provider or pharmacist if you have questions. COMMON BRAND NAME(S): Prolia, XGEVA What should I tell my health care provider before I take this medicine? They need to know if you have any of these conditions: -dental disease -eczema -infection or history of infections -kidney disease or on dialysis -low blood calcium or vitamin D -malabsorption syndrome -scheduled to have surgery or tooth extraction -taking medicine that contains denosumab -thyroid or parathyroid disease -an unusual reaction to denosumab, other medicines, foods, dyes, or preservatives -pregnant or trying to get pregnant -breast-feeding How should I use this medicine? This medicine is for injection under the skin. It is given by a health care professional in a hospital or clinic setting. If you are getting Prolia, a special MedGuide will be given to you by the pharmacist with each prescription and refill. Be sure to read this information carefully each time. For Prolia, talk to your pediatrician regarding the use of this medicine in children. Special care may be needed. For Xgeva, talk to your pediatrician regarding the use of this medicine in children. While this drug may be prescribed for children as young as 13 years for selected conditions, precautions do apply. Overdosage: If you think you've taken too much of this medicine contact a poison control center or emergency room at once. Overdosage: If you think you have taken too much of this medicine contact a poison control center or emergency room at once. NOTE: This medicine is only for  you. Do not share this medicine with others. What if I miss a dose? It is important not to miss your dose. Call your doctor or health care professional if you are unable to keep an appointment. What may interact with this medicine? Do not take this medicine with any of the following medications: -other medicines containing denosumab This medicine may also interact with the following medications: -medicines that suppress the immune system -medicines that treat cancer -steroid medicines like prednisone or cortisone This list may not describe all possible interactions. Give your health care provider a list of all the medicines, herbs, non-prescription drugs, or dietary supplements you use. Also tell them if you smoke, drink alcohol, or use illegal drugs. Some items may interact with your medicine. What should I watch for while using this medicine? Visit your doctor or health care professional for regular checks on your progress. Your doctor or health care professional may order blood tests and other tests to see how you are doing. Call your doctor or health care professional if you get a cold or other infection while receiving this medicine. Do not treat yourself. This medicine may decrease your body's ability to fight infection. You should make sure you get enough calcium and vitamin D while you are taking this medicine, unless your doctor tells you not to. Discuss the foods you eat and the vitamins you take with your health care professional. See your dentist regularly. Brush and floss your teeth as directed. Before you have any dental work done, tell your dentist you are receiving this medicine. Do not become pregnant while taking this medicine or for 5 months after stopping   it. Women should inform their doctor if they wish to become pregnant or think they might be pregnant. There is a potential for serious side effects to an unborn child. Talk to your health care professional or pharmacist for more  information. What side effects may I notice from receiving this medicine? Side effects that you should report to your doctor or health care professional as soon as possible: -allergic reactions like skin rash, itching or hives, swelling of the face, lips, or tongue -breathing problems -chest pain -fast, irregular heartbeat -feeling faint or lightheaded, falls -fever, chills, or any other sign of infection -muscle spasms, tightening, or twitches -numbness or tingling -skin blisters or bumps, or is dry, peels, or red -slow healing or unexplained pain in the mouth or jaw -unusual bleeding or bruising Side effects that usually do not require medical attention (Report these to your doctor or health care professional if they continue or are bothersome.): -muscle pain -stomach upset, gas This list may not describe all possible side effects. Call your doctor for medical advice about side effects. You may report side effects to FDA at 1-800-FDA-1088. Where should I keep my medicine? This medicine is only given in a clinic, doctor's office, or other health care setting and will not be stored at home. NOTE: This sheet is a summary. It may not cover all possible information. If you have questions about this medicine, talk to your doctor, pharmacist, or health care provider.  2015, Elsevier/Gold Standard. (2011-10-08 12:37:47)  

## 2014-04-12 NOTE — Progress Notes (Signed)
Patient Care Team: [name], MD as PCP - General (Family Medicine)  DIAGNOSIS: No matching staging information was found for the patient.  SUMMARY OF ONCOLOGIC HISTORY:   Breast cancer of upper-outer quadrant of left female breast   01/18/2002 Initial Diagnosis Breast cancer of upper-outer quadrant of left female breast; left mastectomy 2/26 lymph nodes positive ER positive received adjuvant TAC chemotherapy followed by radiation and tamoxifen for 9 months stopped for side effects   01/19/2011 Relapse/Recurrence Right breast augmentation surgery was attempted by Dr. Truman Hayward and was found to have a lesion in the upper outer quadrant 3.3 x 1.8 cm biopsy revealed invasive ductal carcinoma with lobular features ER 100% PR 88% HER-2 negative   02/06/2011 Surgery Right breast maste[facility] at [facility] and axillary lymph node dissection with removal of breast implant followed by immediate reconstruction with tissue expander 36 lymph nodes were positive received radiation therapy and no chemotherapy   03/21/2011 - 04/20/2011 Radiation Therapy Radiation to chest wall and axilla   05/29/2011 - 12/31/2011 Anti-estrogen oral therapy Patient received Arimidex for one week and Zoladex x5 months stop Arimidex due to side effects   07/23/2013 Relapse/Recurrence 2 years of back pain and abdominal symptoms with spotting after intercourse revealed uterus enlarged biopsy revealed metastatic breast cancer lobular type: ER positive PR negative   09/18/2013 Surgery Hysterectomy and bilateral salpingo-oophorectomy at [facility] revealed metastatic breast cancer involving tubes and uterus   10/13/2013 -  Anti-estrogen oral therapy Femara for 30 days stopped for patchy alopecia and patient going for second and third opinions between [place], [place], [place] regarding the appropriate antiestrogen plan    CHIEF COMPLIANT: Patient here to receive Xgeva injection, complains of pelvic pain and hip pain  INTERVAL HISTORY: Cynthia Rosario is a  58 year old lady with above-mentioned history of metastatic breast cancer currently on anastrozole in combination with Xgeva injections once every 3 months. She is here today for the Xgeva injection. Her major complaints are pelvic pain as well as the right hip pain. We went over all of her prior bone scans. Even though the main bone scan revealed some abnormalities in the sacroiliac joint, the most current bone scan did not show any abnormalities in those areas. Patient complains of thick pain mostly in that location. She is not taking any medications for this at this time. She continues to be very active and does yoga and physical exercise including lifting weights.  REVIEW OF SYSTEMS:   Constitutional: Denies fevers, chills or abnormal weight loss Eyes: Denies blurriness of vision Ears, nose, mouth, throat, and face: Denies mucositis or sore throat Respiratory: Denies cough, dyspnea or wheezes Cardiovascular: Denies palpitation, chest discomfort or lower extremity swelling Gastrointestinal:  Ventral hernia Skin: Denies abnormal skin rashes Lymphatics: Denies new lymphadenopathy or easy bruising Neurological:Denies numbness, tingling or new weaknesses Behavioral/Psych: Mood is stable, no new changes  Breast:  Lymphedema of the chest wall All other systems were reviewed with the patient and are negative.  I have reviewed the past medical history, past surgical history, social history and family history with the patient and they are unchanged from previous note.  ALLERGIES:  has No Known Allergies.  MEDICATIONS:  Current Outpatient Prescriptions  Medication Sig Dispense Refill  . anastrozole (ARIMIDEX) 1 MG tablet Take 1 tablet (1 mg total) by mouth daily. 90 tablet 3  . aspirin EC 81 MG tablet Take by mouth.    . Calcium Carbonate-Vit D-Min (CALCIUM 600+D PLUS MINERALS) 600-400 MG-UNIT TABS Take by mouth.    Marland Kitchen  ibuprofen (ADVIL,MOTRIN) 200 MG tablet Take 200 mg by mouth.    Marland Kitchen LORazepam  (ATIVAN) 1 MG tablet Take 1 mg by mouth.    . Multiple Vitamins tablet Take 1 tablet by mouth.     No current facility-administered medications for this visit.    PHYSICAL EXAMINATION: ECOG PERFORMANCE STATUS: 1 - Symptomatic but completely ambulatory  Filed Vitals:   04/12/14 1024  BP: 134/66  Pulse: 61  Temp: 97.6 F (36.4 C)  Resp: 18   Filed Weights   04/12/14 1024  Weight: 138 lb 11.2 oz (62.914 kg)    GENERAL:alert, no distress and comfortable SKIN: skin color, texture, turgor are normal, no rashes or significant lesions EYES: normal, Conjunctiva are pink and non-injected, sclera clear OROPHARYNX:no exudate, no erythema and lips, buccal mucosa, and tongue normal  NECK: supple, thyroid normal size, non-tender, without nodularity LYMPH:  no palpable lymphadenopathy in the cervical, axillary or inguinal LUNGS: clear to auscultation and percussion with normal breathing effort HEART: regular rate & rhythm and no murmurs and no lower extremity edema ABDOMEN: Ventral hernia Musculoskeletal:no cyanosis of digits and no clubbing  NEURO: alert & oriented x 3 with fluent speech, no focal motor/sensory deficits   LABORATORY DATA:  I have reviewed the data as listed   Chemistry      Component Value Date/Time   NA 143 01/18/2010 1201   K 3.9 01/18/2010 1201   CL 106 01/18/2010 1201   CO2 30 01/18/2010 1201   BUN 14 01/18/2010 1201   CREATININE 0.77 01/18/2010 1201      Component Value Date/Time   CALCIUM 9.8 01/18/2010 1201   ALKPHOS 74 01/18/2010 1201   AST 19 01/18/2010 1201   ALT 13 01/18/2010 1201   BILITOT 0.6 01/18/2010 1201       Lab Results  Component Value Date   WBC 4.8 01/18/2010   HGB 13.4 01/18/2010   HCT 38.4 01/18/2010   MCV 90.8 01/18/2010   PLT 223 01/18/2010   NEUTROABS 2.4 01/18/2010     RADIOGRAPHIC STUDIES: I have personally reviewed the radiology reports and agreed with their findings. No results found.   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  Patient had second opinions at [facility], [facility] and at [facility] and she is here today to discuss her final treatment decision.  Treatment: Anastrozole 1 mg daily along with Xgeva once every 3 months.  Anastrozole toxicities:  1. Hot flashes 2.  back pain and right hip and pelvic pain : Will obtain a pelvic MRI to evaluate this further if it is arthritis or metastatic disease  Ventral hernia: Will prescribe an abdominal binder. 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 MR Pelvis W Contrast    Standing Status: Future     Number of Occurrences:      Standing Expiration Date: 04/12/2015    Order Specific Question:  Reason for Exam (SYMPTOM  OR DIAGNOSIS REQUIRED)    Answer:  Right hip and pelvic pain with metastatic breast cancer    Order Specific Question:  Preferred imaging location?    Answer:  [facility]    Order Specific Question:  Does the patient have a pacemaker or implanted devices?    Answer:  No    Order Specific Question:  What is the patient's sedation requirement?    Answer:  No Sedation   The patient has a good understanding of the overall plan. she agrees with it. She will call with any problems that may develop before her next visit here.   Rulon Eisenmenger, MD 04/12/2014 10:52 AM

## 2014-04-12 NOTE — Telephone Encounter (Signed)
cx todays inj due to ins need pre auth....pt ok and aware of new d.t for tomorrow

## 2014-04-12 NOTE — Telephone Encounter (Signed)
, °

## 2014-04-13 ENCOUNTER — Ambulatory Visit: Payer: BC Managed Care – PPO

## 2014-04-13 LAB — ZINC: Zinc: 69 ug/dL (ref 60–130)

## 2014-04-13 LAB — SELENIUM SERUM: Selenium, Blood: 188 mcg/L — ABNORMAL HIGH (ref 63–160)

## 2014-04-26 ENCOUNTER — Other Ambulatory Visit: Payer: Self-pay | Admitting: Hematology and Oncology

## 2014-04-28 ENCOUNTER — Ambulatory Visit: Payer: BLUE CROSS/BLUE SHIELD

## 2014-04-28 ENCOUNTER — Other Ambulatory Visit: Payer: Self-pay | Admitting: Hematology and Oncology

## 2014-04-28 ENCOUNTER — Ambulatory Visit (HOSPITAL_COMMUNITY)
Admission: RE | Admit: 2014-04-28 | Discharge: 2014-04-28 | Disposition: A | Discharge status: Home / Self Care | Payer: BLUE CROSS/BLUE SHIELD | Source: Ambulatory Visit | Attending: Hematology and Oncology | Admitting: Hematology and Oncology

## 2014-04-28 DIAGNOSIS — C50412 Malignant neoplasm of upper-outer quadrant of left female breast: Secondary | ICD-10-CM

## 2014-04-28 DIAGNOSIS — C50919 Malignant neoplasm of unspecified site of unspecified female breast: Secondary | ICD-10-CM | POA: Diagnosis not present

## 2014-04-28 DIAGNOSIS — M47898 Other spondylosis, sacral and sacrococcygeal region: Secondary | ICD-10-CM | POA: Insufficient documentation

## 2014-04-28 DIAGNOSIS — M479 Spondylosis, unspecified: Secondary | ICD-10-CM | POA: Diagnosis not present

## 2014-04-28 DIAGNOSIS — R102 Pelvic and perineal pain: Secondary | ICD-10-CM | POA: Insufficient documentation

## 2014-04-28 DIAGNOSIS — C7951 Secondary malignant neoplasm of bone: Secondary | ICD-10-CM | POA: Diagnosis not present

## 2014-04-28 MED ORDER — GADOBENATE DIMEGLUMINE 529 MG/ML IV SOLN
12.0000 mL | Freq: Once | INTRAVENOUS | Status: AC | PRN
  Administered 2014-04-28: 12 mL via INTRAVENOUS

## 2014-05-10 ENCOUNTER — Telehealth: Payer: Self-pay | Admitting: Hematology and Oncology

## 2014-05-10 ENCOUNTER — Ambulatory Visit (HOSPITAL_BASED_OUTPATIENT_CLINIC_OR_DEPARTMENT_OTHER): Payer: BLUE CROSS/BLUE SHIELD | Admitting: Hematology and Oncology

## 2014-05-10 DIAGNOSIS — C50412 Malignant neoplasm of upper-outer quadrant of left female breast: Secondary | ICD-10-CM

## 2014-05-10 DIAGNOSIS — R0789 Other chest pain: Secondary | ICD-10-CM

## 2014-05-10 DIAGNOSIS — C7951 Secondary malignant neoplasm of bone: Secondary | ICD-10-CM

## 2014-05-10 DIAGNOSIS — G629 Polyneuropathy, unspecified: Secondary | ICD-10-CM

## 2014-05-10 DIAGNOSIS — Z8543 Personal history of malignant neoplasm of ovary: Secondary | ICD-10-CM

## 2014-05-10 NOTE — Progress Notes (Signed)
Patient Care Team: [name], MD as PCP - General (Family Medicine)  DIAGNOSIS: No matching staging information was found for the patient.  SUMMARY OF ONCOLOGIC HISTORY:   Breast cancer of upper-outer quadrant of left female breast   01/18/2002 Initial Diagnosis Breast cancer of upper-outer quadrant of left female breast; left mastectomy 2/26 lymph nodes positive ER positive received adjuvant TAC chemotherapy followed by radiation and tamoxifen for 9 months stopped for side effects   01/19/2011 Relapse/Recurrence Right breast augmentation surgery was attempted by Dr. Truman Hayward and was found to have a lesion in the upper outer quadrant 3.3 x 1.8 cm biopsy revealed invasive ductal carcinoma with lobular features ER 100% PR 88% HER-2 negative   02/06/2011 Surgery Right breast mastectomy at [facility] and axillary lymph node dissection with removal of breast implant followed by immediate reconstruction with tissue expander 36 lymph nodes were positive received radiation therapy and no chemotherapy   03/21/2011 - 04/20/2011 Radiation Therapy Radiation to chest wall and axilla   05/29/2011 - 12/31/2011 Anti-estrogen oral therapy Patient received Arimidex for one week and Zoladex x5 months stop Arimidex due to side effects   07/23/2013 Relapse/Recurrence 2 years of back pain and abdominal symptoms with spotting after intercourse revealed uterus enlarged biopsy revealed metastatic breast cancer lobular type: ER positive PR negative   09/18/2013 Surgery Hysterectomy and bilateral [facility]oophorectomy at [facility] revealed metastatic breast cancer involving tubes and uterus   10/13/2013 -  Anti-estrogen oral therapy Femara for 30 days stopped for patchy alopecia and patient going for second and third opinions between [place], [place], [place] regarding the appropriate antiestrogen plan    CHIEF COMPLIANT: follow-up of stage IV breast cancer  INTERVAL HISTORY: Homer Pfeifer is a 59 year old lady with above-mentioned history of  bone metastases and is currently on antiestrogen therapy. She had a recent pelvic MRI which did not show any evidence of bone metastases. She is also complaining of neuropathy in her hands especially on the dorsum. She also feels like a band around her chest all the time. She feels it is related to lymphedema and the chest wall.  REVIEW OF SYSTEMS:   Constitutional: Denies fevers, chills or abnormal weight loss Eyes: Denies blurriness of vision Ears, nose, mouth, throat, and face: Denies mucositis or sore throat Respiratory: Denies cough, dyspnea or wheezes Cardiovascular: Denies palpitation, chest discomfort or lower extremity swelling Gastrointestinal:  Denies nausea, heartburn or change in bowel habits Skin: Denies abnormal skin rashes Lymphatics: Denies new lymphadenopathy or easy bruising Neurological:tingling numbness in her hands Behavioral/Psych: Mood is stable, no new changes  All other systems were reviewed with the patient and are negative.  I have reviewed the past medical history, past surgical history, social history and family history with the patient and they are unchanged from previous note.  ALLERGIES:  has No Known Allergies.  MEDICATIONS:  Current Outpatient Prescriptions  Medication Sig Dispense Refill  . anastrozole (ARIMIDEX) 1 MG tablet Take 1 tablet (1 mg total) by mouth daily. 90 tablet 3  . aspirin EC 81 MG tablet Take by mouth.    . Calcium Carbonate-Vit D-Min (CALCIUM 600+D PLUS MINERALS) 600-400 MG-UNIT TABS Take by mouth.    Marland Kitchen ibuprofen (ADVIL,MOTRIN) 200 MG tablet Take 200 mg by mouth.    Marland Kitchen LORazepam (ATIVAN) 1 MG tablet Take 1 mg by mouth.    . Multiple Vitamins tablet Take 1 tablet by mouth.     No current facility-administered medications for this visit.    PHYSICAL EXAMINATION: ECOG PERFORMANCE  STATUS: 1 - Symptomatic but completely ambulatory  Filed Vitals:   05/10/14 1022  BP: 118/68  Pulse: 61  Temp: 97.4 F (36.3 C)  Resp: 20   Filed  Weights   05/10/14 1022  Weight: 139 lb 12.8 oz (63.413 kg)    GENERAL:alert, no distress and comfortable SKIN: skin color, texture, turgor are normal, no rashes or significant lesions EYES: normal, Conjunctiva are pink and non-injected, sclera clear OROPHARYNX:no exudate, no erythema and lips, buccal mucosa, and tongue normal  NECK: supple, thyroid normal size, non-tender, without nodularity LYMPH:  no palpable lymphadenopathy in the cervical, axillary or inguinal LUNGS: clear to auscultation and percussion with normal breathing effort HEART: regular rate & rhythm and no murmurs and no lower extremity edema ABDOMEN:abdomen soft, non-tender and normal bowel sounds Musculoskeletal:no cyanosis of digits and no clubbing  NEURO: alert & oriented x 3 with fluent speech, no focal motor/sensory deficits BREAST:bilateral implants are intact no palpable lymphadenopathy I did not find lymphedema in the chest wall either.  LABORATORY DATA:  I have reviewed the data as listed   Chemistry      Component Value Date/Time   NA 142 04/12/2014 1055   NA 143 01/18/2010 1201   K 4.9 04/12/2014 1055   K 3.9 01/18/2010 1201   CL 106 01/18/2010 1201   CO2 29 04/12/2014 1055   CO2 30 01/18/2010 1201   BUN 11.9 04/12/2014 1055   BUN 14 01/18/2010 1201   CREATININE 0.7 04/12/2014 1055   CREATININE 0.77 01/18/2010 1201      Component Value Date/Time   CALCIUM 9.9 04/12/2014 1055   CALCIUM 9.8 01/18/2010 1201   ALKPHOS 57 04/12/2014 1055   ALKPHOS 74 01/18/2010 1201   AST 17 04/12/2014 1055   AST 19 01/18/2010 1201   ALT 20 04/12/2014 1055   ALT 13 01/18/2010 1201   BILITOT 0.56 04/12/2014 1055   BILITOT 0.6 01/18/2010 1201       Lab Results  Component Value Date   WBC 4.8 01/18/2010   HGB 13.4 01/18/2010   HCT 38.4 01/18/2010   MCV 90.8 01/18/2010   PLT 223 01/18/2010   NEUTROABS 2.4 01/18/2010     RADIOGRAPHIC STUDIES: I have personally reviewed the radiology reports and agreed  with their findings. 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 Patient has had long-standing neck problems. I will refer her to neurology for further discussion regarding management.  Chest tightness as if she has a band around the chest: I suspect this is related to our implants. Patient worries about his lymphedema in the chest wall and back. To my exam there does not appear to be any swelling.  History of ovarian metastases: prior hysterectomy and salpingo-oophorectomy, Recent pelvic MRI did not show any evidence of disease. Surprisingly it did not even show bone metastases in the lumbar spine or the sacroiliac joint.  Plan: PET/CT scan and follow-up    Orders Placed This  Encounter  Procedures  . NM PET Image Restage (PS) Whole Body    Standing Status: Future     Number of Occurrences:      Standing Expiration Date: 05/10/2015    Order Specific Question:  Reason for Exam (SYMPTOM  OR DIAGNOSIS REQUIRED)    Answer:  Stage 4 breast cancer restaging    Order Specific Question:  Is the patient pregnant?    Answer:  No    Order Specific Question:  Preferred imaging location?    Answer:  [facility]  . Ambulatory referral to Neurology    Referral Priority:  Routine    Referral Type:  Consultation    Referral Reason:  Specialty Services Required    Requested Specialty:  Neurology    Number of Visits Requested:  1    The patient has a good understanding of the overall plan. she agrees with it. She will call with any problems that may develop before her next visit here.   Rulon Eisenmenger, MD

## 2014-05-10 NOTE — Telephone Encounter (Signed)
, °

## 2014-05-10 NOTE — Assessment & Plan Note (Signed)
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 Patient has had long-standing neck problems. I will refer her to neurology for further discussion regarding management.  Chest tightness as if she has a band around the chest: I suspect this is related to our implants. Patient worries about his lymphedema in the chest wall and back. To my exam there does not appear to be any swelling.  History of ovarian metastases: prior hysterectomy and salpingo-oophorectomy, Recent pelvic MRI did not show any evidence of disease. Surprisingly it did not even show bone metastases in the lumbar spine or the sacroiliac joint.  Plan: PET/CT scan and follow-up

## 2014-05-21 ENCOUNTER — Ambulatory Visit (HOSPITAL_COMMUNITY)
Admission: RE | Admit: 2014-05-21 | Discharge: 2014-05-21 | Disposition: A | Discharge status: Home / Self Care | Payer: BLUE CROSS/BLUE SHIELD | Source: Ambulatory Visit | Attending: Hematology and Oncology | Admitting: Hematology and Oncology

## 2014-05-21 DIAGNOSIS — C50412 Malignant neoplasm of upper-outer quadrant of left female breast: Secondary | ICD-10-CM | POA: Diagnosis not present

## 2014-05-21 LAB — GLUCOSE, CAPILLARY: GLUCOSE-CAPILLARY: 85 mg/dL (ref 70–99)

## 2014-05-26 ENCOUNTER — Telehealth: Payer: Self-pay | Admitting: Hematology and Oncology

## 2014-05-26 ENCOUNTER — Telehealth: Payer: Self-pay | Admitting: *Deleted

## 2014-05-26 ENCOUNTER — Other Ambulatory Visit: Payer: Self-pay | Admitting: *Deleted

## 2014-05-26 NOTE — Telephone Encounter (Signed)
Patient returned call and stated that she wanted to wait on having the pet scan done. Spoke with Dr. Lindi Adie, patient has injection appt on 3/21 and he will see her on that day. Pof sent, left message for patient that f/u appts would be scheduled on the same day as her injection appt. Advised patient that if she wants to reschedule pet to call us.

## 2014-05-26 NOTE — Telephone Encounter (Signed)
Called patient in reference to not being able to complete pet scan last Friday due to claustrophobia. Left message that I wanted to reschedule scan but needed to know if she needed some time of sedation to complete the scan. Left message for her to return call.

## 2014-05-26 NOTE — Telephone Encounter (Signed)
Left message to confirm appointment canceled for 02/05 reschedule for 03/21. mailed calendar.

## 2014-05-28 ENCOUNTER — Ambulatory Visit: Payer: BLUE CROSS/BLUE SHIELD | Admitting: Hematology and Oncology

## 2014-07-09 ENCOUNTER — Other Ambulatory Visit: Payer: Self-pay

## 2014-07-09 DIAGNOSIS — C50412 Malignant neoplasm of upper-outer quadrant of left female breast: Secondary | ICD-10-CM

## 2014-07-12 ENCOUNTER — Ambulatory Visit (HOSPITAL_BASED_OUTPATIENT_CLINIC_OR_DEPARTMENT_OTHER): Payer: BLUE CROSS/BLUE SHIELD | Admitting: Hematology and Oncology

## 2014-07-12 ENCOUNTER — Ambulatory Visit: Payer: BLUE CROSS/BLUE SHIELD

## 2014-07-12 ENCOUNTER — Ambulatory Visit: Payer: BC Managed Care – PPO

## 2014-07-12 ENCOUNTER — Other Ambulatory Visit (HOSPITAL_BASED_OUTPATIENT_CLINIC_OR_DEPARTMENT_OTHER): Payer: BLUE CROSS/BLUE SHIELD

## 2014-07-12 ENCOUNTER — Telehealth: Payer: Self-pay | Admitting: Hematology and Oncology

## 2014-07-12 DIAGNOSIS — C50412 Malignant neoplasm of upper-outer quadrant of left female breast: Secondary | ICD-10-CM

## 2014-07-12 DIAGNOSIS — C7951 Secondary malignant neoplasm of bone: Secondary | ICD-10-CM

## 2014-07-12 LAB — CBC WITH DIFFERENTIAL/PLATELET
BASO%: 0.6 % (ref 0.0–2.0)
Basophils Absolute: 0 10*3/uL (ref 0.0–0.1)
EOS ABS: 0.2 10*3/uL (ref 0.0–0.5)
EOS%: 3.2 % (ref 0.0–7.0)
HEMATOCRIT: 42.5 % (ref 34.8–46.6)
HGB: 14 g/dL (ref 11.6–15.9)
LYMPH#: 1.7 10*3/uL (ref 0.9–3.3)
LYMPH%: 35.2 % (ref 14.0–49.7)
MCH: 30.7 pg (ref 25.1–34.0)
MCHC: 32.9 g/dL (ref 31.5–36.0)
MCV: 93.2 fL (ref 79.5–101.0)
MONO#: 0.5 10*3/uL (ref 0.1–0.9)
MONO%: 9.6 % (ref 0.0–14.0)
NEUT%: 51.4 % (ref 38.4–76.8)
NEUTROS ABS: 2.4 10*3/uL (ref 1.5–6.5)
Platelets: 216 10*3/uL (ref 145–400)
RBC: 4.56 10*6/uL (ref 3.70–5.45)
RDW: 12.1 % (ref 11.2–14.5)
WBC: 4.7 10*3/uL (ref 3.9–10.3)

## 2014-07-12 LAB — COMPREHENSIVE METABOLIC PANEL (CC13)
ALBUMIN: 4 g/dL (ref 3.5–5.0)
ALT: 21 U/L (ref 0–55)
AST: 19 U/L (ref 5–34)
Alkaline Phosphatase: 57 U/L (ref 40–150)
Anion Gap: 9 mEq/L (ref 3–11)
BUN: 12.7 mg/dL (ref 7.0–26.0)
CHLORIDE: 104 meq/L (ref 98–109)
CO2: 30 mEq/L — ABNORMAL HIGH (ref 22–29)
CREATININE: 0.8 mg/dL (ref 0.6–1.1)
Calcium: 9.6 mg/dL (ref 8.4–10.4)
EGFR: 89 mL/min/{1.73_m2} — AB (ref >90)
GLUCOSE: 92 mg/dL (ref 70–140)
POTASSIUM: 4.5 meq/L (ref 3.5–5.1)
Sodium: 143 mEq/L (ref 136–145)
TOTAL PROTEIN: 6.9 g/dL (ref 6.4–8.3)
Total Bilirubin: 0.53 mg/dL (ref 0.20–1.20)

## 2014-07-12 NOTE — Telephone Encounter (Signed)
per pof to sch pt appt-gave pt copy of sch °

## 2014-07-12 NOTE — Progress Notes (Signed)
Patient Care Team: [name], MD as PCP - General (Family Medicine)  DIAGNOSIS: No matching staging information was found for the patient.  SUMMARY OF ONCOLOGIC HISTORY:   Breast cancer of upper-outer quadrant of left female breast   01/18/2002 Initial Diagnosis Breast cancer of upper-outer quadrant of left female breast; left mastectomy 2/26 lymph nodes positive ER positive received adjuvant TAC chemotherapy followed by radiation and tamoxifen for 9 months stopped for side effects   01/19/2011 Relapse/Recurrence Right breast augmentation surgery was attempted by Dr. Truman Hayward and was found to have a lesion in the upper outer quadrant 3.3 x 1.8 cm biopsy revealed invasive ductal carcinoma with lobular features ER 100% PR 88% HER-2 negative   02/06/2011 Surgery Right breast mastectomy at [facility] and axillary lymph node dissection with removal of breast implant followed by immediate reconstruction with tissue expander 36 lymph nodes were positive received radiation therapy and no chemotherapy   03/21/2011 - 04/20/2011 Radiation Therapy Radiation to chest wall and axilla   05/29/2011 - 12/31/2011 Anti-estrogen oral therapy Patient received Arimidex for one week and Zoladex x5 months stop Arimidex due to side effects   07/23/2013 Relapse/Recurrence 2 years of back pain and abdominal symptoms with spotting after intercourse revealed uterus enlarged biopsy revealed metastatic breast cancer lobular type: ER positive PR negative   09/18/2013 Surgery Hysterectomy and bilateral salpingo-oophorectomy at [facility] revealed metastatic breast cancer involving tubes and uterus   10/13/2013 -  Anti-estrogen oral therapy Femara for 30 days stopped for patchy alopecia and patient going for second and third opinions between [place], Rob Hickman, [place] regarding the appropriate antiestrogen plan    CHIEF COMPLIANT: Follow-up of metastatic breast cancer  INTERVAL HISTORY: Cynthia Rosario is a 59 year old lady with above-mentioned history of  metastatic breast cancer who is here for follow-up. She reports that XGeva causes discomfort in the throat and she would like to discontinue it. she also did not undergo PET CT scan because she was claustrophobic. She would like to hold off on doing imaging studies until October 2016. She denies any bone pain. She is eating healthy and doing yoga.    REVIEW OF SYSTEMS:   Constitutional: Denies fevers, chills or abnormal weight loss Eyes: Denies blurriness of vision Ears, nose, mouth, throat, and face: Denies mucositis or sore throat Respiratory: Denies cough, dyspnea or wheezes Cardiovascular: Denies palpitation, chest discomfort or lower extremity swelling Gastrointestinal:  Denies nausea, heartburn or change in bowel habits Skin: Denies abnormal skin rashes Lymphatics: Denies new lymphadenopathy or easy bruising Neurological:Denies numbness, tingling or new weaknesses Behavioral/Psych: Mood is stable, no new changes  Breast:  denies any pain or lumps or nodules in either breasts All other systems were reviewed with the patient and are negative.  I have reviewed the past medical history, past surgical history, social history and family history with the patient and they are unchanged from previous note.  ALLERGIES:  has No Known Allergies.  MEDICATIONS:  Current Outpatient Prescriptions  Medication Sig Dispense Refill  . anastrozole (ARIMIDEX) 1 MG tablet Take 1 tablet (1 mg total) by mouth daily. 90 tablet 3  . aspirin EC 81 MG tablet Take by mouth.    . Calcium Carbonate-Vit D-Min (CALCIUM 600+D PLUS MINERALS) 600-400 MG-UNIT TABS Take by mouth.    Marland Kitchen ibuprofen (ADVIL,MOTRIN) 200 MG tablet Take 200 mg by mouth.    Marland Kitchen LORazepam (ATIVAN) 1 MG tablet Take 1 mg by mouth.    . Multiple Vitamins tablet Take 1 tablet by mouth.  No current facility-administered medications for this visit.    PHYSICAL EXAMINATION: ECOG PERFORMANCE STATUS: 0 - Asymptomatic  Filed Vitals:   07/12/14 1015   BP: 151/79  Pulse: 68  Temp: 97.6 F (36.4 C)  Resp: 18   Filed Weights   07/12/14 1015  Weight: 137 lb 6.4 oz (62.324 kg)    GENERAL:alert, no distress and comfortable SKIN: skin color, texture, turgor are normal, no rashes or significant lesions EYES: normal, Conjunctiva are pink and non-injected, sclera clear OROPHARYNX:no exudate, no erythema and lips, buccal mucosa, and tongue normal  NECK: supple, thyroid normal size, non-tender, without nodularity LYMPH:  no palpable lymphadenopathy in the cervical, axillary or inguinal LUNGS: clear to auscultation and percussion with normal breathing effort HEART: regular rate & rhythm and no murmurs and no lower extremity edema ABDOMEN:abdomen soft, non-tender and normal bowel sounds Musculoskeletal:no cyanosis of digits and no clubbing  NEURO: alert & oriented x 3 with fluent speech, no focal motor/sensory deficits   LABORATORY DATA:  I have reviewed the data as listed   Chemistry      Component Value Date/Time   NA 143 07/12/2014 1001   NA 143 01/18/2010 1201   K 4.5 07/12/2014 1001   K 3.9 01/18/2010 1201   CL 106 01/18/2010 1201   CO2 30* 07/12/2014 1001   CO2 30 01/18/2010 1201   BUN 12.7 07/12/2014 1001   BUN 14 01/18/2010 1201   CREATININE 0.8 07/12/2014 1001   CREATININE 0.77 01/18/2010 1201      Component Value Date/Time   CALCIUM 9.6 07/12/2014 1001   CALCIUM 9.8 01/18/2010 1201   ALKPHOS 57 07/12/2014 1001   ALKPHOS 74 01/18/2010 1201   AST 19 07/12/2014 1001   AST 19 01/18/2010 1201   ALT 21 07/12/2014 1001   ALT 13 01/18/2010 1201   BILITOT 0.53 07/12/2014 1001   BILITOT 0.6 01/18/2010 1201       Lab Results  Component Value Date   WBC 4.7 07/12/2014   HGB 14.0 07/12/2014   HCT 42.5 07/12/2014   MCV 93.2 07/12/2014   PLT 216 07/12/2014   NEUTROABS 2.4 07/12/2014     RADIOGRAPHIC STUDIES: I have personally reviewed the radiology reports and agreed with their findings. No results found.    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  Patient had second opinions at [facility], [facility] and at [facility] and finally decided to get treated here Current treatment: Anastrozole 1 mg daily along with ex vivo once a month  We had lengthy discussion regarding the role of imaging studies in evaluation of metastatic breast cancer. Previously she had an MRI of her pelvis which did not show any evidence of bony metastatic disease. I discussed with her that the patient has 4 options for imaging evaluation. She could have PET CT scan, CT chest abdomen pelvis and bone scan, bone scan alone, her MRI of the thoracic spine. We will plan on doing one of the scans in October 2016. Patient will think about it and will let us know.  Bone metastases: Patient would like to stop X eva injections. This is by her preference. She gets throat discomfort when she gets these shots. Return to clinic in 3 months for follow-up.  Orders Placed This Encounter  Procedures  . CBC with Differential    Standing Status: Future     Number of Occurrences:      Standing Expiration Date: 07/12/2015  . Comprehensive metabolic panel (Cmet) - [facility]    Standing Status: Future     Number of Occurrences:      Standing Expiration Date: 07/12/2015   The patient has a good understanding of the overall plan. she agrees with it. She will call with any problems that may develop before her next visit here.   Rulon Eisenmenger, MD

## 2014-07-12 NOTE — Assessment & Plan Note (Signed)
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  Patient had second opinions at [facility], [facility] and at [facility] and finally decided to get treated here Current treatment: Anastrozole 1 mg daily along with ex vivo once a month  We had lengthy discussion regarding the role of imaging studies in evaluation of metastatic breast cancer. Previously she had an MRI of her pelvis which did not show any evidence of bony metastatic disease. I discussed with her that the patient has 4 options for imaging evaluation. She could have PET CT scan, CT chest abdomen pelvis and bone scan, bone scan alone, her MRI of the thoracic spine. We will plan on doing one of the scans in October 2016. Patient will think about it and will let us know.  Bone metastases: Patient would like to stop X eva injections. This is by her preference. She gets throat discomfort when she gets these shots. Return to clinic in 3 months for follow-up.

## 2014-10-17 NOTE — Assessment & Plan Note (Signed)
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  Patient had second opinions at [facility], [facility] and at [facility] and finally decided to get treated here Current treatment: Anastrozole 1 mg daily he patient has 4 options for imaging evaluation. She could have PET CT scan, CT chest abdomen pelvis and bone scan, bone scan alone, her MRI of the thoracic spine. We will plan on doing one of the scans in October 2016. Patient will think about it and will let us know.

## 2014-10-18 ENCOUNTER — Ambulatory Visit (HOSPITAL_BASED_OUTPATIENT_CLINIC_OR_DEPARTMENT_OTHER): Payer: BLUE CROSS/BLUE SHIELD | Admitting: Hematology and Oncology

## 2014-10-18 ENCOUNTER — Telehealth: Payer: Self-pay | Admitting: Hematology and Oncology

## 2014-10-18 ENCOUNTER — Encounter: Payer: Self-pay | Admitting: Hematology and Oncology

## 2014-10-18 ENCOUNTER — Other Ambulatory Visit (HOSPITAL_BASED_OUTPATIENT_CLINIC_OR_DEPARTMENT_OTHER): Payer: BLUE CROSS/BLUE SHIELD

## 2014-10-18 VITALS — BP 128/71 | HR 67 | Temp 98.2°F | Resp 20 | Ht 68.0 in | Wt 137.0 lb

## 2014-10-18 DIAGNOSIS — C50412 Malignant neoplasm of upper-outer quadrant of left female breast: Secondary | ICD-10-CM

## 2014-10-18 DIAGNOSIS — C7982 Secondary malignant neoplasm of genital organs: Secondary | ICD-10-CM

## 2014-10-18 DIAGNOSIS — C7960 Secondary malignant neoplasm of unspecified ovary: Secondary | ICD-10-CM

## 2014-10-18 DIAGNOSIS — C7951 Secondary malignant neoplasm of bone: Secondary | ICD-10-CM

## 2014-10-18 LAB — COMPREHENSIVE METABOLIC PANEL (CC13)
ALK PHOS: 45 U/L (ref 40–150)
ALT: 19 U/L (ref 0–55)
AST: 19 U/L (ref 5–34)
Albumin: 4 g/dL (ref 3.5–5.0)
Anion Gap: 6 mEq/L (ref 3–11)
BILIRUBIN TOTAL: 0.67 mg/dL (ref 0.20–1.20)
BUN: 11.8 mg/dL (ref 7.0–26.0)
CO2: 30 mEq/L — ABNORMAL HIGH (ref 22–29)
Calcium: 9.9 mg/dL (ref 8.4–10.4)
Chloride: 107 mEq/L (ref 98–109)
Creatinine: 0.8 mg/dL (ref 0.6–1.1)
EGFR: 86 mL/min/{1.73_m2} — ABNORMAL LOW (ref >90)
Glucose: 92 mg/dl (ref 70–140)
Potassium: 4.7 mEq/L (ref 3.5–5.1)
SODIUM: 143 meq/L (ref 136–145)
TOTAL PROTEIN: 6.5 g/dL (ref 6.4–8.3)

## 2014-10-18 LAB — CBC WITH DIFFERENTIAL/PLATELET
BASO%: 0.9 % (ref 0.0–2.0)
Basophils Absolute: 0 10*3/uL (ref 0.0–0.1)
EOS ABS: 0.1 10*3/uL (ref 0.0–0.5)
EOS%: 3.1 % (ref 0.0–7.0)
HEMATOCRIT: 40.9 % (ref 34.8–46.6)
HGB: 13.3 g/dL (ref 11.6–15.9)
LYMPH#: 1.5 10*3/uL (ref 0.9–3.3)
LYMPH%: 45.2 % (ref 14.0–49.7)
MCH: 30.3 pg (ref 25.1–34.0)
MCHC: 32.5 g/dL (ref 31.5–36.0)
MCV: 93.2 fL (ref 79.5–101.0)
MONO#: 0.3 10*3/uL (ref 0.1–0.9)
MONO%: 8 % (ref 0.0–14.0)
NEUT%: 42.8 % (ref 38.4–76.8)
NEUTROS ABS: 1.4 10*3/uL — AB (ref 1.5–6.5)
Platelets: 185 10*3/uL (ref 145–400)
RBC: 4.39 10*6/uL (ref 3.70–5.45)
RDW: 12.4 % (ref 11.2–14.5)
WBC: 3.3 10*3/uL — AB (ref 3.9–10.3)

## 2014-10-18 MED ORDER — LORAZEPAM 1 MG PO TABS: 1.0000 mg | ORAL_TABLET | Freq: Two times a day (BID) | ORAL | Status: DC | PRN

## 2014-10-18 NOTE — Telephone Encounter (Signed)
Gave avs & calendar for November.  °

## 2014-10-18 NOTE — Progress Notes (Signed)
Patient Care Team: [name], MD as PCP - General (Family Medicine)  DIAGNOSIS: No matching staging information was found for the patient.  SUMMARY OF ONCOLOGIC HISTORY:   Breast cancer of upper-outer quadrant of left female breast   01/18/2002 Initial Diagnosis Breast cancer of upper-outer quadrant of left female breast; left mastectomy 2/26 lymph nodes positive ER positive received adjuvant TAC chemotherapy followed by radiation and tamoxifen for 9 months stopped for side effects   01/19/2011 Relapse/Recurrence Right breast augmentation surgery was attempted by Dr. Truman Hayward and was found to have a lesion in the upper outer quadrant 3.3 x 1.8 cm biopsy revealed invasive ductal carcinoma with lobular features ER 100% PR 88% HER-2 negative   02/06/2011 Surgery Right breast mastectomy at [facility] and axillary lymph node dissection with removal of breast implant followed by immediate reconstruction with tissue expander 36 lymph nodes were positive received radiation therapy and no chemotherapy   03/21/2011 - 04/20/2011 Radiation Therapy Radiation to chest wall and axilla   05/29/2011 - 12/31/2011 Anti-estrogen oral therapy Patient received Arimidex for one week and Zoladex x5 months stop Arimidex due to side effects   07/23/2013 Relapse/Recurrence 2 years of back pain and abdominal symptoms with spotting after intercourse revealed uterus enlarged biopsy revealed metastatic breast cancer lobular type: ER positive PR negative   09/18/2013 Surgery Hysterectomy and bilateral salping[facility]oophorectomy at [facility] revealed metastatic breast cancer involving tubes and uterus   10/13/2013 -  Anti-estrogen oral therapy Femara for 30 days stopped for patchy alopecia and patient going for second and third opinions between [place], Cynthia Rosario, [place] regarding the appropriate antiestrogen plan    CHIEF COMPLIANT: Follow-up on anastrozole  INTERVAL HISTORY: Cynthia Rosario is a 59 year old with above-mentioned history of metastatic breast  cancer on anastrozole. She is tolerating it very well without any major problems or concerns. Her major issue is psychological. She continues things about her metastatic disease and the likelihood that she cannot be cured and concern about death and dying. She plans to go to [place] next week.  REVIEW OF SYSTEMS:   Constitutional: Denies fevers, chills or abnormal weight loss Eyes: Denies blurriness of vision Ears, nose, mouth, throat, and face: Denies mucositis or sore throat Respiratory: Denies cough, dyspnea or wheezes Cardiovascular: Denies palpitation, chest discomfort or lower extremity swelling Gastrointestinal:  Denies nausea, heartburn or change in bowel habits Skin: Denies abnormal skin rashes Lymphatics: Denies new lymphadenopathy or easy bruising Neurological:Denies numbness, tingling or new weaknesses Behavioral/Psych: Mood is stable, no new changes  Breast:  denies any pain or lumps or nodules in either breasts All other systems were reviewed with the patient and are negative.  I have reviewed the past medical history, past surgical history, social history and family history with the patient and they are unchanged from previous note.  ALLERGIES:  has No Known Allergies.  MEDICATIONS:  Current Outpatient Prescriptions  Medication Sig Dispense Refill  . anastrozole (ARIMIDEX) 1 MG tablet Take 1 tablet (1 mg total) by mouth daily. 90 tablet 3  . aspirin EC 81 MG tablet Take by mouth.    . Calcium Carbonate-Vit D-Min (CALCIUM 600+D PLUS MINERALS) 600-400 MG-UNIT TABS Take by mouth.    . clobetasol (TEMOVATE) 0.05 % external solution   3  . ibuprofen (ADVIL,MOTRIN) 200 MG tablet Take 200 mg by mouth.    Marland Kitchen LORazepam (ATIVAN) 1 MG tablet Take 1 tablet (1 mg total) by mouth 2 (two) times daily as needed for anxiety. 30 tablet 0  . Multiple Vitamins  tablet Take 1 tablet by mouth.     No current facility-administered medications for this visit.    PHYSICAL EXAMINATION: ECOG  PERFORMANCE STATUS: 1 - Symptomatic but completely ambulatory  Filed Vitals:   10/18/14 0933  BP: 128/71  Pulse: 67  Temp: 98.2 F (36.8 C)  Resp: 20   Filed Weights   10/18/14 0933  Weight: 137 lb (62.143 kg)    GENERAL:alert, no distress and comfortable SKIN: skin color, texture, turgor are normal, no rashes or significant lesions EYES: normal, Conjunctiva are pink and non-injected, sclera clear OROPHARYNX:no exudate, no erythema and lips, buccal mucosa, and tongue normal  NECK: supple, thyroid normal size, non-tender, without nodularity LYMPH:  no palpable lymphadenopathy in the cervical, axillary or inguinal LUNGS: clear to auscultation and percussion with normal breathing effort HEART: regular rate & rhythm and no murmurs and no lower extremity edema ABDOMEN:abdomen soft, non-tender and normal bowel sounds Musculoskeletal:no cyanosis of digits and no clubbing  NEURO: alert & oriented x 3 with fluent speech, no focal motor/sensory deficits   LABORATORY DATA:  I have reviewed the data as listed   Chemistry      Component Value Date/Time   NA 143 07/12/2014 1001   NA 143 01/18/2010 1201   K 4.5 07/12/2014 1001   K 3.9 01/18/2010 1201   CL 106 01/18/2010 1201   CO2 30* 07/12/2014 1001   CO2 30 01/18/2010 1201   BUN 12.7 07/12/2014 1001   BUN 14 01/18/2010 1201   CREATININE 0.8 07/12/2014 1001   CREATININE 0.77 01/18/2010 1201      Component Value Date/Time   CALCIUM 9.6 07/12/2014 1001   CALCIUM 9.8 01/18/2010 1201   ALKPHOS 57 07/12/2014 1001   ALKPHOS 74 01/18/2010 1201   AST 19 07/12/2014 1001   AST 19 01/18/2010 1201   ALT 21 07/12/2014 1001   ALT 13 01/18/2010 1201   BILITOT 0.53 07/12/2014 1001   BILITOT 0.6 01/18/2010 1201       Lab Results  Component Value Date   WBC 3.3* 10/18/2014   HGB 13.3 10/18/2014   HCT 40.9 10/18/2014   MCV 93.2 10/18/2014   PLT 185 10/18/2014   NEUTROABS 1.4* 10/18/2014   ASSESSMENT & PLAN:  Breast cancer of  upper-outer quadrant of left female breast 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  Patient had second opinions at [facility], [facility] and at [facility] and finally decided to get treated here Current treatment: Anastrozole 1 mg daily  Recommendation: PET/CT scan end of October 2016 and follow-up beginning of November 2016. Patient has plans to go to [place] next week and will spend 3-4 weeks there. She is very concerned that she doesn't have much to do on a day-to-day basis and hence she continues to thinks about her breast cancer. 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 Patient also has a horse with which she spends significant amount of time. This helps her a lot.   No orders of the defined types were placed in this encounter.   The patient has a good understanding of the overall plan. she agrees with it. she will call with any problems that may develop before the next visit here.   [name]  Raliegh Ip, MD

## 2015-01-11 ENCOUNTER — Encounter: Payer: Self-pay | Admitting: Hematology and Oncology

## 2015-01-14 ENCOUNTER — Other Ambulatory Visit: Payer: Self-pay | Admitting: Hematology and Oncology

## 2015-01-14 DIAGNOSIS — C50412 Malignant neoplasm of upper-outer quadrant of left female breast: Secondary | ICD-10-CM

## 2015-01-21 ENCOUNTER — Telehealth: Payer: Self-pay | Admitting: Hematology and Oncology

## 2015-01-21 ENCOUNTER — Other Ambulatory Visit: Payer: Self-pay | Admitting: Hematology and Oncology

## 2015-01-21 ENCOUNTER — Other Ambulatory Visit: Payer: Self-pay | Admitting: *Deleted

## 2015-01-21 DIAGNOSIS — C50412 Malignant neoplasm of upper-outer quadrant of left female breast: Secondary | ICD-10-CM

## 2015-01-21 NOTE — Telephone Encounter (Signed)
Called patient per pof and patient request mri to be at [facility] not [facility],sent patient call to nurse as she had multi questions

## 2015-02-02 ENCOUNTER — Ambulatory Visit (HOSPITAL_COMMUNITY)
Admission: RE | Admit: 2015-02-02 | Discharge: 2015-02-02 | Disposition: A | Discharge status: Home / Self Care | Payer: BLUE CROSS/BLUE SHIELD | Source: Ambulatory Visit | Attending: Hematology and Oncology | Admitting: Hematology and Oncology

## 2015-02-02 DIAGNOSIS — M542 Cervicalgia: Secondary | ICD-10-CM | POA: Insufficient documentation

## 2015-02-02 DIAGNOSIS — C7951 Secondary malignant neoplasm of bone: Secondary | ICD-10-CM | POA: Insufficient documentation

## 2015-02-02 DIAGNOSIS — C50412 Malignant neoplasm of upper-outer quadrant of left female breast: Secondary | ICD-10-CM | POA: Insufficient documentation

## 2015-02-02 DIAGNOSIS — M4802 Spinal stenosis, cervical region: Secondary | ICD-10-CM | POA: Diagnosis not present

## 2015-02-02 MED ORDER — GADOBENATE DIMEGLUMINE 529 MG/ML IV SOLN
15.0000 mL | Freq: Once | INTRAVENOUS | Status: AC | PRN
  Administered 2015-02-02: 12 mL via INTRAVENOUS

## 2015-02-08 ENCOUNTER — Telehealth: Payer: Self-pay

## 2015-02-08 NOTE — Telephone Encounter (Signed)
Pt spoke with patient about MRI results.

## 2015-02-17 ENCOUNTER — Ambulatory Visit (HOSPITAL_COMMUNITY): Payer: BLUE CROSS/BLUE SHIELD

## 2015-03-03 ENCOUNTER — Encounter: Payer: Self-pay | Admitting: *Deleted

## 2015-03-03 NOTE — Assessment & Plan Note (Signed)
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  Patient had second opinions at [facility], [facility] and at [facility] and finally decided to get treated here Current treatment: Anastrozole 1 mg daily MRI C-Spine: Multiple osseous metastases are present. Foci of enhancement are described at C7, T2, T6, T7, T9, and T10.  RTC in 6 months

## 2015-03-03 NOTE — Progress Notes (Signed)
Received office notes from [facility], sent to scan.

## 2015-03-04 ENCOUNTER — Telehealth: Payer: Self-pay | Admitting: Hematology and Oncology

## 2015-03-04 ENCOUNTER — Ambulatory Visit (HOSPITAL_BASED_OUTPATIENT_CLINIC_OR_DEPARTMENT_OTHER): Payer: BLUE CROSS/BLUE SHIELD | Admitting: Hematology and Oncology

## 2015-03-04 ENCOUNTER — Other Ambulatory Visit: Payer: Self-pay | Admitting: *Deleted

## 2015-03-04 ENCOUNTER — Encounter: Payer: Self-pay | Admitting: Hematology and Oncology

## 2015-03-04 ENCOUNTER — Ambulatory Visit: Payer: BLUE CROSS/BLUE SHIELD

## 2015-03-04 VITALS — BP 135/77 | HR 75 | Temp 97.8°F | Resp 18 | Ht 68.0 in | Wt 135.4 lb

## 2015-03-04 DIAGNOSIS — C50412 Malignant neoplasm of upper-outer quadrant of left female breast: Secondary | ICD-10-CM | POA: Diagnosis not present

## 2015-03-04 DIAGNOSIS — C7951 Secondary malignant neoplasm of bone: Secondary | ICD-10-CM | POA: Diagnosis not present

## 2015-03-04 DIAGNOSIS — F419 Anxiety disorder, unspecified: Secondary | ICD-10-CM | POA: Diagnosis not present

## 2015-03-04 NOTE — Progress Notes (Signed)
Patient Care Team: [name], MD as PCP - General (Family Medicine)  DIAGNOSIS: No matching staging information was found for the patient.  SUMMARY OF ONCOLOGIC HISTORY:   Breast cancer of upper-outer quadrant of left female breast ([place])   01/18/2002 Initial Diagnosis Breast cancer of upper-outer quadrant of left female breast; left mastectomy 2/26 lymph nodes positive ER positive received adjuvant TAC chemotherapy followed by radiation and tamoxifen for 9 months stopped for side effects   01/19/2011 Relapse/Recurrence Right breast augmentation surgery was attempted by Dr. Truman Hayward and was found to have a lesion in the upper outer quadrant 3.3 x 1.8 cm biopsy revealed invasive ductal carcinoma with lobular features ER 100% PR 88% HER-2 negative   02/06/2011 Surgery Right breast mastectomy at [facility] and axillary lymph node dissection with removal of breast implant followed by immediate reconstruction with tissue expander 36 lymph nodes were positive received radiation therapy and no chemotherapy   03/21/2011 - 04/20/2011 Radiation Therapy Radiation to chest wall and axilla   05/29/2011 - 12/31/2011 Anti-estrogen oral therapy Patient received Arimidex for one week and Zoladex x5 months stop Arimidex due to side effects   07/23/2013 Relapse/Recurrence 2 years of back pain and abdominal symptoms with spotting after intercourse revealed uterus enlarged biopsy revealed metastatic breast cancer lobular type: ER positive PR negative   09/18/2013 Surgery Hysterectomy and bilateral salpingo[facility]oophorectomy at [facility] revealed metastatic breast cancer involving tubes and uterus   10/13/2013 -  Anti-estrogen oral therapy Femara for 30 days stopped for patchy alopecia and patient going for second and third opinions between [place], [place] regarding the appropriate antiestrogen plan   02/02/2015 Imaging Multiple osseous metastases are present. Foci of enhancement are described at C7, T2, T6, T7, T9, and T10    CHIEF  COMPLIANT: F/U on anastrozole  INTERVAL HISTORY: Cynthia Rosario is a 59 year old with above-mentioned history of metastatic breast cancer with bone metastases. She had a MRI of her cervical thoracic spine which showed multiple areas of bone metastases. She is currently on anastrozole and is tolerating extremely well. She has met with our orthopedic specialists who recommended that she be careful and not ride horses.  REVIEW OF SYSTEMS:   Constitutional: Denies fevers, chills or abnormal weight loss Eyes: Denies blurriness of vision Ears, nose, mouth, throat, and face: Denies mucositis or sore throat Respiratory: Denies cough, dyspnea or wheezes Cardiovascular: Denies palpitation, chest discomfort or lower extremity swelling Gastrointestinal:  Denies nausea, heartburn or change in bowel habits Skin: Denies abnormal skin rashes Lymphatics: Denies new lymphadenopathy or easy bruising Neurological:Denies numbness, tingling or new weaknesses Behavioral/Psych: Mood is stable, no new changes  All other systems were reviewed with the patient and are negative.  I have reviewed the past medical history, past surgical history, social history and family history with the patient and they are unchanged from previous note.  ALLERGIES:  has No Known Allergies.  MEDICATIONS:  Current Outpatient Prescriptions  Medication Sig Dispense Refill  . anastrozole (ARIMIDEX) 1 MG tablet Take 1 tablet (1 mg total) by mouth daily. 90 tablet 3  . aspirin EC 81 MG tablet Take by mouth.    . Calcium Carbonate-Vit D-Min (CALCIUM 600+D PLUS MINERALS) 600-400 MG-UNIT TABS Take by mouth.    . clobetasol (TEMOVATE) 0.05 % external solution   3  . ibuprofen (ADVIL,MOTRIN) 200 MG tablet Take 200 mg by mouth.    Marland Kitchen LORazepam (ATIVAN) 1 MG tablet Take 1 tablet (1 mg total) by mouth 2 (two) times daily as  needed for anxiety. 30 tablet 0  . Multiple Vitamins tablet Take 1 tablet by mouth.     No current facility-administered  medications for this visit.    PHYSICAL EXAMINATION: ECOG PERFORMANCE STATUS: 1 - Symptomatic but completely ambulatory  Filed Vitals:   03/04/15 0959  BP: 135/77  Pulse: 75  Temp: 97.8 F (36.6 C)  Resp: 18   Filed Weights   03/04/15 0959  Weight: 135 lb 6.4 oz (61.417 kg)    GENERAL:alert, no distress and comfortable SKIN: skin color, texture, turgor are normal, no rashes or significant lesions EYES: normal, Conjunctiva are pink and non-injected, sclera clear OROPHARYNX:no exudate, no erythema and lips, buccal mucosa, and tongue normal  NECK: supple, thyroid normal size, non-tender, without nodularity LYMPH:  no palpable lymphadenopathy in the cervical, axillary or inguinal LUNGS: clear to auscultation and percussion with normal breathing effort HEART: regular rate & rhythm and no murmurs and no lower extremity edema ABDOMEN:abdomen soft, non-tender and normal bowel sounds Musculoskeletal:no cyanosis of digits and no clubbing  NEURO: alert & oriented x 3 with fluent speech, no focal motor/sensory deficits   LABORATORY DATA:  I have reviewed the data as listed   Chemistry      Component Value Date/Time   NA 143 10/18/2014 0914   NA 143 01/18/2010 1201   K 4.7 10/18/2014 0914   K 3.9 01/18/2010 1201   CL 106 01/18/2010 1201   CO2 30* 10/18/2014 0914   CO2 30 01/18/2010 1201   BUN 11.8 10/18/2014 0914   BUN 14 01/18/2010 1201   CREATININE 0.8 10/18/2014 0914   CREATININE 0.77 01/18/2010 1201      Component Value Date/Time   CALCIUM 9.9 10/18/2014 0914   CALCIUM 9.8 01/18/2010 1201   ALKPHOS 45 10/18/2014 0914   ALKPHOS 74 01/18/2010 1201   AST 19 10/18/2014 0914   AST 19 01/18/2010 1201   ALT 19 10/18/2014 0914   ALT 13 01/18/2010 1201   BILITOT 0.67 10/18/2014 0914   BILITOT 0.6 01/18/2010 1201       Lab Results  Component Value Date   WBC 3.3* 10/18/2014   HGB 13.3 10/18/2014   HCT 40.9 10/18/2014   MCV 93.2 10/18/2014   PLT 185 10/18/2014    NEUTROABS 1.4* 10/18/2014   ASSESSMENT & PLAN:  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  Patient had second opinions at [facility], [facility] and at [facility] and finally decided to get treated here Current treatment: Anastrozole 1 mg daily MRI C-Spine: Multiple osseous metastases are present. Foci of enhancement are described at C7, T2, T6, T7, T9, and T10.  Discussed the scans and she had many questions 1. Role of Metformin 2. Role of fasting 3. New clinical trials without chemo  Severe Anxiety: Massage has helped her She enjoyed gong to [place] in July 2016  Our plan is to obtain MRI spine in 6-12 months. RTC in 6 months   Orders Placed This Encounter  Procedures  . DG Bone Density    Standing Status: Future     Number of Occurrences:      Standing Expiration Date: 03/03/2016    Order Specific Question:  Reason for Exam (SYMPTOM  OR DIAGNOSIS REQUIRED)    Answer:  Post menopausal on Anastrozole  Order Specific Question:  Is the patient pregnant?    Answer:  No    Order Specific Question:  Preferred imaging location?    Answer:  [facility]   The patient has a good understanding of the overall plan. she agrees with it. she will call with any problems that may develop before the next visit here.   Rulon Eisenmenger, MD 03/04/2015

## 2015-03-04 NOTE — Telephone Encounter (Signed)
Appointments made and avs printed for patient,patient declines dexa at the [facility] or [facility] but may check with her pcp to see if they do them  anne

## 2015-03-07 ENCOUNTER — Other Ambulatory Visit: Payer: Self-pay

## 2015-03-07 ENCOUNTER — Other Ambulatory Visit: Payer: BLUE CROSS/BLUE SHIELD

## 2015-03-07 DIAGNOSIS — C50412 Malignant neoplasm of upper-outer quadrant of left female breast: Secondary | ICD-10-CM

## 2015-03-09 ENCOUNTER — Ambulatory Visit (HOSPITAL_BASED_OUTPATIENT_CLINIC_OR_DEPARTMENT_OTHER): Payer: BLUE CROSS/BLUE SHIELD

## 2015-03-09 DIAGNOSIS — C50412 Malignant neoplasm of upper-outer quadrant of left female breast: Secondary | ICD-10-CM | POA: Diagnosis not present

## 2015-03-09 LAB — COMPREHENSIVE METABOLIC PANEL (CC13)
ALT: 18 U/L (ref 0–55)
ANION GAP: 13 meq/L — AB (ref 3–11)
AST: 20 U/L (ref 5–34)
Albumin: 4.3 g/dL (ref 3.5–5.0)
Alkaline Phosphatase: 86 U/L (ref 40–150)
BILIRUBIN TOTAL: 0.46 mg/dL (ref 0.20–1.20)
BUN: 16.1 mg/dL (ref 7.0–26.0)
CHLORIDE: 105 meq/L (ref 98–109)
CO2: 25 meq/L (ref 22–29)
Calcium: 10 mg/dL (ref 8.4–10.4)
Creatinine: 0.8 mg/dL (ref 0.6–1.1)
EGFR: 83 mL/min/{1.73_m2} — AB (ref >90)
GLUCOSE: 77 mg/dL (ref 70–140)
POTASSIUM: 4 meq/L (ref 3.5–5.1)
SODIUM: 143 meq/L (ref 136–145)
TOTAL PROTEIN: 7.5 g/dL (ref 6.4–8.3)

## 2015-03-09 LAB — CBC WITH DIFFERENTIAL/PLATELET
BASO%: 1.3 % (ref 0.0–2.0)
Basophils Absolute: 0.1 10*3/uL (ref 0.0–0.1)
EOS ABS: 0.1 10*3/uL (ref 0.0–0.5)
EOS%: 2.2 % (ref 0.0–7.0)
HCT: 42.6 % (ref 34.8–46.6)
HGB: 14 g/dL (ref 11.6–15.9)
LYMPH%: 35.7 % (ref 14.0–49.7)
MCH: 31.3 pg (ref 25.1–34.0)
MCHC: 32.9 g/dL (ref 31.5–36.0)
MCV: 95.3 fL (ref 79.5–101.0)
MONO#: 0.4 10*3/uL (ref 0.1–0.9)
MONO%: 7.9 % (ref 0.0–14.0)
NEUT#: 2.9 10*3/uL (ref 1.5–6.5)
NEUT%: 52.9 % (ref 38.4–76.8)
Platelets: 221 10*3/uL (ref 145–400)
RBC: 4.47 10*6/uL (ref 3.70–5.45)
RDW: 12.2 % (ref 11.2–14.5)
WBC: 5.5 10*3/uL (ref 3.9–10.3)
lymph#: 2 10*3/uL (ref 0.9–3.3)

## 2015-03-10 ENCOUNTER — Ambulatory Visit: Payer: BLUE CROSS/BLUE SHIELD | Admitting: Nutrition

## 2015-03-10 NOTE — Progress Notes (Signed)
59 year old female diagnosed with metastatic breast cancer with relapse/recurrence in 2012.  Past medical history includes osteopenia.  Medications include anastrozole, calcium, vitamin D, Ativan, multivitamin.  Labs were reviewed.  Height: 68 inches. Weight: 135.4 pounds November 11. Usual body weight: 139 pounds January 2016. BMI: 20.59.  Patient would like information on healthy diet for breast cancer survivorship.  Nutrition diagnosis:  Food and nutrition related knowledge deficit related to breast cancer as evidenced by no prior need for nutrition related information.  Intervention:  Patient was educated to follow primarily plant-based diet with adequate calories and protein for weight maintenance. Educated patient on importance of avoiding alcohol intake. Reviewed sources of omega-3 fatty acids. Explained the role of antioxidants and fruits, vegetables and spices and herbs. Educated patient on the role of exercise in cancer prevention Multiple fact sheets were provided.  Teach back method was used.  Contact information was given.  Monitoring, evaluation, goals: Patient will tolerate healthy plant-based diet to promote weight maintenance.  No follow-up required.

## 2015-03-18 LAB — INSULIN-LIKE GROWTH FACTOR
IGF-I, LC/MS: 176 ng/mL (ref 50–317)
Z-SCORE (FEMALE): 0.6 {STDV} (ref ?–2.0)

## 2015-03-31 ENCOUNTER — Telehealth: Payer: Self-pay

## 2015-03-31 DIAGNOSIS — C50412 Malignant neoplasm of upper-outer quadrant of left female breast: Secondary | ICD-10-CM

## 2015-03-31 NOTE — Telephone Encounter (Signed)
Returned pt call re: labs and referral.  Answered questions patient had about her last lab results.  Pt requested referral to [facility].  Referral and pof placed.

## 2015-04-01 ENCOUNTER — Telehealth: Payer: Self-pay | Admitting: Hematology and Oncology

## 2015-04-01 NOTE — Telephone Encounter (Signed)
[facility] referral noted and they will call her

## 2015-04-05 ENCOUNTER — Other Ambulatory Visit: Payer: Self-pay

## 2015-04-05 DIAGNOSIS — Z780 Asymptomatic menopausal state: Secondary | ICD-10-CM

## 2015-04-05 DIAGNOSIS — C50412 Malignant neoplasm of upper-outer quadrant of left female breast: Secondary | ICD-10-CM

## 2015-04-05 DIAGNOSIS — Z79811 Long term (current) use of aromatase inhibitors: Secondary | ICD-10-CM

## 2015-04-05 NOTE — Progress Notes (Signed)
[facility] faxed [facility] with request to schedule with patient.

## 2015-04-06 ENCOUNTER — Telehealth: Payer: Self-pay

## 2015-04-06 NOTE — Telephone Encounter (Signed)
I spoke to patient and moved her appt to Dr. Jannifer Franklin' schedule. She was agreeable to this and will check in at 11:45.

## 2015-04-07 ENCOUNTER — Ambulatory Visit: Payer: BLUE CROSS/BLUE SHIELD | Admitting: Neurology

## 2015-04-07 ENCOUNTER — Ambulatory Visit (INDEPENDENT_AMBULATORY_CARE_PROVIDER_SITE_OTHER): Payer: BLUE CROSS/BLUE SHIELD | Admitting: Neurology

## 2015-04-07 ENCOUNTER — Encounter: Payer: Self-pay | Admitting: Neurology

## 2015-04-07 VITALS — BP 143/83 | HR 76 | Ht 68.5 in | Wt 137.0 lb

## 2015-04-07 DIAGNOSIS — R202 Paresthesia of skin: Secondary | ICD-10-CM | POA: Diagnosis not present

## 2015-04-07 HISTORY — DX: Paresthesia of skin: R20.2

## 2015-04-07 NOTE — Patient Instructions (Signed)
Paresthesia Paresthesia is an abnormal burning or prickling sensation. This sensation is generally felt in the hands, arms, legs, or feet. However, it may occur in any part of the body. Usually, it is not painful. The feeling may be described as:  Tingling or numbness.  Pins and needles.  Skin crawling.  Buzzing.  Limbs falling asleep.  Itching. Most people experience temporary (transient) paresthesia at some time in their lives. Paresthesia may occur when you breathe too quickly (hyperventilation). It can also occur without any apparent cause. Commonly, paresthesia occurs when pressure is placed on a nerve. The sensation quickly goes away after the pressure is removed. For some people, however, paresthesia is a long-lasting (chronic) condition that is caused by an underlying disorder. If you continue to have paresthesia, you may need further medical evaluation. HOME CARE INSTRUCTIONS Watch your condition for any changes. Taking the following actions may help to lessen any discomfort that you are feeling:  Avoid drinking alcohol.  Try acupuncture or massage to help relieve your symptoms.  Keep all follow-up visits as directed by your health care provider. This is important. SEEK MEDICAL CARE IF:  You continue to have episodes of paresthesia.  Your burning or prickling feeling gets worse when you walk.  You have pain, cramps, or dizziness.  You develop a rash. SEEK IMMEDIATE MEDICAL CARE IF:  You feel weak.  You have trouble walking or moving.  You have problems with speech, understanding, or vision.  You feel confused.  You cannot control your bladder or bowel movements.  You have numbness after an injury.  You faint.   This information is not intended to replace advice given to you by your health care provider. Make sure you discuss any questions you have with your health care provider.   Document Released: 03/30/2002 Document Revised: 08/24/2014 Document Reviewed:  04/05/2014 Elsevier Interactive Patient Education 2016 Elsevier Inc.  

## 2015-04-07 NOTE — Progress Notes (Signed)
Reason for visit:  paresthesias  Referring physician:  Dr. Felipe Drone is a 59 y.o. female  History of present illness:   Cynthia Rosario is a 59 year old right-handed white female with a history of breast cancer that was originally diagnosed in 2003 on Cynthia left breast. Cynthia Rosario underwent a mastectomy, then underwent radiation therapy and chemotherapy that included Cytoxan, Adriamycin, and Taxotere. Cynthia Rosario developed sensory changes with Cynthia chemotherapy that have been persistent until Cynthia present date. She has noted paresthesias in Cynthia legs and arms, with Cynthia arms more than Cynthia legs. She has not had any weakness or significant gait instability. She denies any issues controlling Cynthia bowels or Cynthia bladder. In 2012 she was found to have metastatic cancer involving Cynthia right breast. Cynthia Rosario underwent a mastectomy and radiation therapy without chemotherapy. She has been found to have metastatic cancer in Cynthia uterus and ovaries following a hysterectomy. Cynthia Rosario has also been noted to have bony metastases to Cynthia spine involving Cynthia C7, T2, T6 and T7 vertebral bodies and Cynthia T10 vertebral body. There is no compromise of Cynthia spinal canal or involvement in Cynthia nerve roots with any of these lesions. Cynthia Rosario has recently gotten back into horseback riding. She had noted one episode where her hands seem to vibrate after she was riding a horse. This lasted about a half an hour, then cleared. She reports no issues dropping things from her hands. She does have some dizziness when turning her head at times, this has been present since 2003. She denies any progression of sensory symptoms per se in Cynthia arms or legs. Occasionally, she may have transient vibration sensations in Cynthia back area as well. She comes in today for an evaluation.  Past Medical History  Diagnosis Date  . Breast cancer ([place])   . Cancer (HCC)     breast  . Dermoid   . Ovarian cyst   . Urinary urgency   . Osteopenia     . Cervical polyp   . Abnormal pap   . Fibroid   . Kidney stones   . Paresthesia 04/07/2015    Past Surgical History  Procedure Laterality Date  . Wisdom tooth extraction    . Breast lumpectomy      x2  . Mastectomy      x2  . Reconstruction breast w/ tram flap    . Breast enhancement surgery    . Abdominal hysterectomy  09/18/13    robotic hyst, bso for metastatic breast cancer, Dr Alycia Rossetti at [facility].    Family History  Problem Relation Age of Onset  . Heart disease Father   . Hypertension Mother     Social history:  reports that she has quit smoking. She has never used smokeless tobacco. She reports that she does not drink alcohol or use illicit drugs.  Medications:  Prior to Admission medications   Medication Sig Start Date End Date Taking? Authorizing Provider  anastrozole (ARIMIDEX) 1 MG tablet Take 1 tablet (1 mg total) by mouth daily. 03/15/14  Yes [name], MD  aspirin EC 81 MG tablet Take by mouth.   Yes Historical Provider, MD  Calcium Carbonate-Vit D-Min (CALCIUM 600+D PLUS MINERALS) 600-400 MG-UNIT TABS Take by mouth.   Yes Historical Provider, MD  clobetasol (TEMOVATE) 0.05 % external solution  08/05/14  Yes Historical Provider, MD  ibuprofen (ADVIL,MOTRIN) 200 MG tablet Take 200 mg by mouth.   Yes Historical Provider, MD  LORazepam (ATIVAN) 1 MG  tablet Take 1 tablet (1 mg total) by mouth 2 (two) times daily as needed for anxiety. 10/18/14  Yes [name], MD  Multiple Vitamins tablet Take 1 tablet by mouth.   Yes Historical Provider, MD     No Known Allergies  ROS:  Out of a complete 14 system review of symptoms, Cynthia Rosario complains only of Cynthia following symptoms, and all other reviewed systems are negative.   Hearing loss, ringing in Cynthia ears  Snoring  Feeling cold  Runny nose  Numbness, weakness  Depression, anxiety    Blood pressure 143/83, pulse 76, height 5' 8.5" (1.74 m), weight 137 lb (62.143 kg).  Physical Exam  General: Cynthia Rosario is  alert and cooperative at Cynthia time of Cynthia examination.  Eyes: Pupils are equal, round, and reactive to light. Discs are flat bilaterally.  Neck: Cynthia neck is supple, no carotid bruits are noted.  Respiratory: Cynthia respiratory examination is clear.  Cardiovascular: Cynthia cardiovascular examination reveals a regular rate and rhythm, no obvious murmurs or rubs are noted.  Skin: Extremities are without significant edema.  Neurologic Exam  Mental status: Cynthia Rosario is alert and oriented x 3 at Cynthia time of Cynthia examination. Cynthia Rosario has apparent normal recent and remote memory, with an apparently normal attention span and concentration ability.  Cranial nerves: Facial symmetry is present. There is good sensation of Cynthia face to pinprick and soft touch bilaterally. Cynthia strength of Cynthia facial muscles and Cynthia muscles to head turning and shoulder shrug are normal bilaterally. Speech is well enunciated, no aphasia or dysarthria is noted. Extraocular movements are full. Visual fields are full. Cynthia tongue is midline, and Cynthia Rosario has symmetric elevation of Cynthia soft palate. No obvious hearing deficits are noted.  Motor: Cynthia motor testing reveals 5 over 5 strength of all 4 extremities. Good symmetric motor tone is noted throughout.  Sensory: Sensory testing is intact to pinprick, soft touch, vibration sensation, and position sense on all 4 extremities.  Cynthia Rosario does report some tingling with light touch on Cynthia feet. No evidence of extinction is noted.  Coordination: Cerebellar testing reveals good finger-nose-finger and heel-to-shin bilaterally.  Tinel sign at Cynthia wrists were negative bilaterally.  Gait and station: Gait is normal. Tandem gait is normal. Romberg is negative. No drift is seen.  Reflexes: Deep tendon reflexes are symmetric, but are depressed bilaterally. Toes are downgoing bilaterally.   MRI cervical and thoracic 02/09/15:  IMPRESSION: 1. Multiple osseous metastases are present.  Foci of enhancement are described at C7, T2, T6, T7, T9, and T10. 2. No pathologic enhancement is present. 3. Chronic endplate degenerative changes at C5-6 and C6-7. 4. Mild left foraminal narrowing at C5-6. 5. Moderate foraminal stenosis at C6-7 is worse on Cynthia right. 6. No evidence for intra canalicular metastasis or dural Enhancement.  * MRI scan images were reviewed online. I agree with Cynthia written report.    Assessment/Plan:   1. 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 Cynthia Rosario likely has a neuronopathy following chemotherapy. Cynthia Rosario has had some occasional transient sensory changes , but no clear evidence of progression of sensation over time. She indicates that she is a vegan, we will check some blood work today. She will be set up for nerve conduction studies of both arms, and one leg. EMG will be done on Cynthia right arm. Cynthia Rosario is concerned  whether or not Cynthia metastatic spine lesions are Cynthia etiology of her sensory changes, I think that they are not. There is some question whether or not Cynthia radiation may be causing some of her sensory complaints. She will follow-up with Cynthia EMG evaluation.  Jill Alexanders MD 04/07/2015 7:35 PM  [facility] [address] [place], [place] [address]  Phone [phone] Fax [phone]

## 2015-04-09 LAB — COPPER, SERUM: Copper: 101 ug/dL (ref 72–166)

## 2015-04-09 LAB — VITAMIN B12: Vitamin B-12: 469 pg/mL (ref 211–946)

## 2015-04-09 LAB — ANA W/REFLEX: ANA: NEGATIVE

## 2015-05-04 ENCOUNTER — Encounter: Payer: Self-pay | Admitting: Neurology

## 2015-05-04 ENCOUNTER — Ambulatory Visit (INDEPENDENT_AMBULATORY_CARE_PROVIDER_SITE_OTHER): Payer: BLUE CROSS/BLUE SHIELD | Admitting: Neurology

## 2015-05-04 ENCOUNTER — Ambulatory Visit (INDEPENDENT_AMBULATORY_CARE_PROVIDER_SITE_OTHER): Payer: Self-pay | Admitting: Neurology

## 2015-05-04 DIAGNOSIS — R202 Paresthesia of skin: Secondary | ICD-10-CM

## 2015-05-04 NOTE — Procedures (Signed)
     HISTORY:  Cynthia Rosario is a 60 year old patient with a history of metastatic breast cancer. The patient has sustained some chronic numbness in the extremities associated with chemotherapy involving Taxotere. The patient has noted some intermittent vibration sensations in the extremities. She is being evaluated for these issues.  NERVE CONDUCTION STUDIES:  Nerve conduction studies were performed on both upper extremities. The distal motor latencies and motor amplitudes for the median and ulnar nerves were within normal limits. The F wave latencies and nerve conduction velocities for these nerves were also normal. The sensory latencies for the median and ulnar nerves were normal.  Nerve conduction studies were performed on the left lower extremity. The distal motor latencies and motor amplitudes for the peroneal and posterior tibial nerves were within normal limits. The nerve conduction velocities for these nerves were also normal. The H reflex latency was normal. The sensory latency for the peroneal nerve was within normal limits.   EMG STUDIES:  EMG study was performed on the right upper extremity:  The first dorsal interosseous muscle reveals 2 to 4 K units with full recruitment. No fibrillations or positive waves were noted. The abductor pollicis brevis muscle reveals 2 to 4 K units with full recruitment. No fibrillations or positive waves were noted. The extensor indicis proprius muscle reveals 1 to 3 K units with full recruitment. No fibrillations or positive waves were noted. The pronator teres muscle reveals 2 to 3 K units with full recruitment. No fibrillations or positive waves were noted. The biceps muscle reveals 1 to 2 K units with full recruitment. No fibrillations or positive waves were noted. The triceps muscle reveals 2 to 4 K units with full recruitment. No fibrillations or positive waves were noted. The anterior deltoid muscle reveals 2 to 3 K units with full  recruitment. No fibrillations or positive waves were noted. The cervical paraspinal muscles were tested at 2 levels. No abnormalities of insertional activity were seen at either level tested. There was good relaxation.   IMPRESSION:  Nerve conduction studies done on both upper extremities and on the left lower extremity were unremarkable, without evidence of a peripheral neuropathy. The sensory latencies and amplitudes were normal. EMG evaluation of the right upper extremity was unremarkable, no evidence of an overlying cervical radiculopathy is seen.  Jill Alexanders MD 05/04/2015 5:07 PM  [facility] [address] [place], [place] [address]  Phone [phone] Fax [phone]

## 2015-05-04 NOTE — Progress Notes (Signed)
Cynthia Rosario is a 60 year old white female with a history of metastatic breast cancer. The patient has had paresthesias of the extremities following Taxotere chemotherapy. The patient reports some vibration sensations intermittently. She comes in for EMG and nerve conduction study evaluation.  Nerve conduction studies on both arms and the left leg were normal. EMG of the right arm was normal.  There is no evidence of a significant sensory neuropathy. The patient will be followed conservatively over time. She will contact me if any new issues arise.

## 2015-05-04 NOTE — Progress Notes (Signed)
Please refer to EMG and nerve conduction study procedure note. 

## 2015-08-01 ENCOUNTER — Other Ambulatory Visit: Payer: Self-pay | Admitting: *Deleted

## 2015-09-05 ENCOUNTER — Ambulatory Visit (HOSPITAL_BASED_OUTPATIENT_CLINIC_OR_DEPARTMENT_OTHER): Payer: BLUE CROSS/BLUE SHIELD | Admitting: Hematology and Oncology

## 2015-09-05 ENCOUNTER — Encounter: Payer: Self-pay | Admitting: Hematology and Oncology

## 2015-09-05 ENCOUNTER — Telehealth: Payer: Self-pay | Admitting: Hematology and Oncology

## 2015-09-05 ENCOUNTER — Ambulatory Visit (HOSPITAL_BASED_OUTPATIENT_CLINIC_OR_DEPARTMENT_OTHER): Payer: BLUE CROSS/BLUE SHIELD

## 2015-09-05 VITALS — BP 115/81 | HR 83 | Temp 98.6°F | Resp 18 | Ht 68.5 in | Wt 139.1 lb

## 2015-09-05 DIAGNOSIS — F418 Other specified anxiety disorders: Secondary | ICD-10-CM | POA: Diagnosis not present

## 2015-09-05 DIAGNOSIS — C7982 Secondary malignant neoplasm of genital organs: Secondary | ICD-10-CM

## 2015-09-05 DIAGNOSIS — C7960 Secondary malignant neoplasm of unspecified ovary: Secondary | ICD-10-CM | POA: Diagnosis not present

## 2015-09-05 DIAGNOSIS — C50412 Malignant neoplasm of upper-outer quadrant of left female breast: Secondary | ICD-10-CM

## 2015-09-05 LAB — COMPREHENSIVE METABOLIC PANEL
ALBUMIN: 3.8 g/dL (ref 3.5–5.0)
ALK PHOS: 90 U/L (ref 40–150)
ALT: 19 U/L (ref 0–55)
AST: 19 U/L (ref 5–34)
Anion Gap: 7 mEq/L (ref 3–11)
BUN: 15 mg/dL (ref 7.0–26.0)
CO2: 29 meq/L (ref 22–29)
Calcium: 9.3 mg/dL (ref 8.4–10.4)
Chloride: 105 mEq/L (ref 98–109)
Creatinine: 0.8 mg/dL (ref 0.6–1.1)
EGFR: 85 mL/min/{1.73_m2} — ABNORMAL LOW (ref >90)
GLUCOSE: 116 mg/dL (ref 70–140)
POTASSIUM: 4.1 meq/L (ref 3.5–5.1)
SODIUM: 141 meq/L (ref 136–145)
TOTAL PROTEIN: 6.7 g/dL (ref 6.4–8.3)
Total Bilirubin: <0.3 mg/dL (ref 0.20–1.20)

## 2015-09-05 LAB — CBC WITH DIFFERENTIAL/PLATELET
BASO%: 1.1 % (ref 0.0–2.0)
Basophils Absolute: 0.1 10*3/uL (ref 0.0–0.1)
EOS%: 1.8 % (ref 0.0–7.0)
Eosinophils Absolute: 0.1 10*3/uL (ref 0.0–0.5)
HCT: 37.9 % (ref 34.8–46.6)
HEMOGLOBIN: 12.2 g/dL (ref 11.6–15.9)
LYMPH%: 34.7 % (ref 14.0–49.7)
MCH: 30.3 pg (ref 25.1–34.0)
MCHC: 32.3 g/dL (ref 31.5–36.0)
MCV: 93.7 fL (ref 79.5–101.0)
MONO#: 0.3 10*3/uL (ref 0.1–0.9)
MONO%: 5.9 % (ref 0.0–14.0)
NEUT%: 56.5 % (ref 38.4–76.8)
NEUTROS ABS: 3.2 10*3/uL (ref 1.5–6.5)
Platelets: 121 10*3/uL — ABNORMAL LOW (ref 145–400)
RBC: 4.04 10*6/uL (ref 3.70–5.45)
RDW: 12.8 % (ref 11.2–14.5)
WBC: 5.7 10*3/uL (ref 3.9–10.3)
lymph#: 2 10*3/uL (ref 0.9–3.3)

## 2015-09-05 MED ORDER — CYANOCOBALAMIN 500 MCG PO TABS: 500.0000 ug | ORAL_TABLET | Freq: Every day | ORAL | Status: DC

## 2015-09-05 NOTE — Assessment & Plan Note (Signed)
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  Patient had second opinions at [facility], [facility] and at [facility] and finally decided to get treated here  Current treatment: Anastrozole 1 mg daily MRI C-Spine 02/09/2015: Multiple osseous metastases are present. Foci of enhancement are described at C7, T2, T6, T7, T9, and T10.  Severe Anxiety: Massage has helped her.  Return to clinic in 6 months for follow-up with an MRI spine

## 2015-09-05 NOTE — Telephone Encounter (Signed)
appt made and avs printed °

## 2015-09-05 NOTE — Progress Notes (Signed)
Patient Care Team: [name], MD as PCP - General (Family Medicine) [name], MD as Consulting Physician (Hematology and Oncology)  DIAGNOSIS: metastatic breast cancer  SUMMARY OF ONCOLOGIC HISTORY:   Breast cancer of upper-outer quadrant of left female breast ([place])   01/18/2002 Initial Diagnosis Breast cancer of upper-outer quadrant of left female breast; left mastectomy 2/26 lymph nodes positive ER positive received adjuvant TAC chemotherapy followed by radiation and tamoxifen for 9 months stopped for side effects   01/19/2011 Relapse/Recurrence Right breast augmentation surgery was attempted by Dr. Truman Hayward and was found to have a lesion in the upper outer quadrant 3.3 x 1.8 cm biopsy revealed invasive ductal carcinoma with lobular features ER 100% PR 88% HER-2 negative   02/06/2011 Surgery Right breast mastectomy at [facility] and axillary lymph node dissection with removal of breast implant followed by immediate reconstruction with tissue expander 36 lymph nodes were positive received radiation therapy and no chemotherapy   03/21/2011 - 04/20/2011 Radiation Therapy Radiation to chest wall and axilla   05/29/2011 - 12/31/2011 Anti-estrogen oral therapy Patient received Arimidex for one week and Zoladex x5 months stop Arimidex due to side effects   07/23/2013 Relapse/Recurrence 2 years of back pain and abdominal symptoms with spotting after intercourse revealed uterus enlarged biopsy revealed metastatic breast cancer lobular type: ER positive PR negative   09/18/2013 Surgery Hysterectomy and bilateral salpingo[facility]oophorectomy at [facility] revealed metastatic breast cancer involving tubes and uterus   10/13/2013 -  Anti-estrogen oral therapy Femara for 30 days stopped for patchy alopecia and patient going for second and third opinions between [place], [place] regarding the appropriate antiestrogen plan   02/02/2015 Imaging Multiple osseous metastases are present. Foci of enhancement are described at C7, T2,  T6, T7, T9, and T10    CHIEF COMPLIANT: Follow-up on anastrozole for metastatic breast cancer  INTERVAL HISTORY: Cynthia Rosario is a 60 year old with above-mentioned history metastatic breast cancer currently on anastrozole therapy. She appears to be tolerating it extremely well. She has remained fairly active including horse riding and exercising. She has been eating very healthy accompanied by periods of fasting. Her overall stress levels have come down. She is not using any medications for stress anymore. She denies any back problems. She denies any hot flashes or myalgias from anastrozole.  REVIEW OF SYSTEMS:   Constitutional: Denies fevers, chills or abnormal weight loss Eyes: Denies blurriness of vision Ears, nose, mouth, throat, and face: Denies mucositis or sore throat Respiratory: Denies cough, dyspnea or wheezes Cardiovascular: Denies palpitation, chest discomfort Gastrointestinal:  Denies nausea, heartburn or change in bowel habits Skin: Denies abnormal skin rashes Lymphatics: Denies new lymphadenopathy or easy bruising Neurological:Denies numbness, tingling or new weaknesses Behavioral/Psych: Mood is stable, no new changes  Extremities: No lower extremity edema Breast:  denies any pain or lumps or nodules in either breasts All other systems were reviewed with the patient and are negative.  I have reviewed the past medical history, past surgical history, social history and family history with the patient and they are unchanged from previous note.  ALLERGIES:  has No Known Allergies.  MEDICATIONS:  Current Outpatient Prescriptions  Medication Sig Dispense Refill  . anastrozole (ARIMIDEX) 1 MG tablet Take 1 tablet (1 mg total) by mouth daily. 90 tablet 3  . aspirin EC 81 MG tablet Take by mouth.    . Calcium Carbonate-Vit D-Min (CALCIUM 600+D PLUS MINERALS) 600-400 MG-UNIT TABS Take by mouth.    . cyanocobalamin 500 MCG tablet Take 1 tablet (  500 mcg total) by mouth daily.      Marland Kitchen ibuprofen (ADVIL,MOTRIN) 200 MG tablet Take 200 mg by mouth.    . Multiple Vitamins tablet Take 1 tablet by mouth.     No current facility-administered medications for this visit.    PHYSICAL EXAMINATION: ECOG PERFORMANCE STATUS: 1 - Symptomatic but completely ambulatory  Filed Vitals:   09/05/15 1335  BP: 115/81  Pulse: 83  Temp: 98.6 F (37 C)  Resp: 18   Filed Weights   09/05/15 1335  Weight: 139 lb 1.6 oz (63.095 kg)    GENERAL:alert, no distress and comfortable SKIN: skin color, texture, turgor are normal, no rashes or significant lesions EYES: normal, Conjunctiva are pink and non-injected, sclera clear OROPHARYNX:no exudate, no erythema and lips, buccal mucosa, and tongue normal  NECK: supple, thyroid normal size, non-tender, without nodularity LYMPH:  no palpable lymphadenopathy in the cervical, axillary or inguinal LUNGS: clear to auscultation and percussion with normal breathing effort HEART: regular rate & rhythm and no murmurs and no lower extremity edema ABDOMEN:abdomen soft, non-tender and normal bowel sounds MUSCULOSKELETAL:no cyanosis of digits and no clubbing  NEURO: alert & oriented x 3 with fluent speech, no focal motor/sensory deficits EXTREMITIES: No lower extremity edema   LABORATORY DATA:  I have reviewed the data as listed   Chemistry      Component Value Date/Time   NA 141 09/05/2015 1426   NA 143 01/18/2010 1201   K 4.1 09/05/2015 1426   K 3.9 01/18/2010 1201   CL 106 01/18/2010 1201   CO2 29 09/05/2015 1426   CO2 30 01/18/2010 1201   BUN 15.0 09/05/2015 1426   BUN 14 01/18/2010 1201   CREATININE 0.8 09/05/2015 1426   CREATININE 0.77 01/18/2010 1201      Component Value Date/Time   CALCIUM 9.3 09/05/2015 1426   CALCIUM 9.8 01/18/2010 1201   ALKPHOS 90 09/05/2015 1426   ALKPHOS 74 01/18/2010 1201   AST 19 09/05/2015 1426   AST 19 01/18/2010 1201   ALT 19 09/05/2015 1426   ALT 13 01/18/2010 1201   BILITOT <0.30 09/05/2015 1426    BILITOT 0.6 01/18/2010 1201       Lab Results  Component Value Date   WBC 5.7 09/05/2015   HGB 12.2 09/05/2015   HCT 37.9 09/05/2015   MCV 93.7 09/05/2015   PLT 121 Large platelets present* 09/05/2015   NEUTROABS 3.2 09/05/2015   ASSESSMENT & PLAN:  Breast cancer of upper-outer quadrant of left female breast 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  Patient had second opinions at [facility], [facility] and at [facility] and finally decided to get treated here  Current treatment: Anastrozole 1 mg daily MRI C-Spine 02/09/2015: Multiple osseous metastases are present. Foci of enhancement are described at C7, T2, T6, T7, T9, and T10.  Severe Anxiety: marked improvement We elected to not perform scans unless she has worsening symptoms Return to clinic in 6 months for follow-up    Orders Placed This Encounter  Procedures  . CBC with Differential    Standing Status: Future     Number of Occurrences: 1     Standing Expiration Date: 09/04/2016  . Comprehensive metabolic panel    Standing Status: Future     Number of Occurrences: 1     Standing Expiration Date:  09/04/2016   The patient has a good understanding of the overall plan. she agrees with it. she will call with any problems that may develop before the next visit here.   Rulon Eisenmenger, MD 09/05/2015

## 2016-03-02 ENCOUNTER — Other Ambulatory Visit: Payer: Self-pay

## 2016-03-02 DIAGNOSIS — C50412 Malignant neoplasm of upper-outer quadrant of left female breast: Secondary | ICD-10-CM

## 2016-03-05 ENCOUNTER — Ambulatory Visit (HOSPITAL_BASED_OUTPATIENT_CLINIC_OR_DEPARTMENT_OTHER): Payer: BLUE CROSS/BLUE SHIELD | Admitting: Hematology and Oncology

## 2016-03-05 ENCOUNTER — Encounter: Payer: Self-pay | Admitting: Hematology and Oncology

## 2016-03-05 ENCOUNTER — Other Ambulatory Visit (HOSPITAL_BASED_OUTPATIENT_CLINIC_OR_DEPARTMENT_OTHER): Payer: BLUE CROSS/BLUE SHIELD

## 2016-03-05 VITALS — BP 140/75 | HR 69 | Temp 97.5°F | Resp 18 | Ht 68.5 in | Wt 138.5 lb

## 2016-03-05 DIAGNOSIS — C7960 Secondary malignant neoplasm of unspecified ovary: Secondary | ICD-10-CM

## 2016-03-05 DIAGNOSIS — C50412 Malignant neoplasm of upper-outer quadrant of left female breast: Secondary | ICD-10-CM | POA: Diagnosis not present

## 2016-03-05 DIAGNOSIS — Z170 Estrogen receptor positive status [ER+]: Secondary | ICD-10-CM | POA: Diagnosis not present

## 2016-03-05 DIAGNOSIS — D696 Thrombocytopenia, unspecified: Secondary | ICD-10-CM

## 2016-03-05 DIAGNOSIS — C7951 Secondary malignant neoplasm of bone: Secondary | ICD-10-CM | POA: Diagnosis not present

## 2016-03-05 DIAGNOSIS — C7982 Secondary malignant neoplasm of genital organs: Secondary | ICD-10-CM

## 2016-03-05 DIAGNOSIS — D649 Anemia, unspecified: Secondary | ICD-10-CM

## 2016-03-05 DIAGNOSIS — F419 Anxiety disorder, unspecified: Secondary | ICD-10-CM

## 2016-03-05 LAB — COMPREHENSIVE METABOLIC PANEL
ALT: 20 U/L (ref 0–55)
AST: 25 U/L (ref 5–34)
Albumin: 3.7 g/dL (ref 3.5–5.0)
Alkaline Phosphatase: 110 U/L (ref 40–150)
Anion Gap: 10 mEq/L (ref 3–11)
BUN: 13.3 mg/dL (ref 7.0–26.0)
CALCIUM: 9.8 mg/dL (ref 8.4–10.4)
CHLORIDE: 105 meq/L (ref 98–109)
CO2: 27 meq/L (ref 22–29)
Creatinine: 0.8 mg/dL (ref 0.6–1.1)
EGFR: 82 mL/min/{1.73_m2} — ABNORMAL LOW (ref >90)
Glucose: 94 mg/dl (ref 70–140)
POTASSIUM: 4.4 meq/L (ref 3.5–5.1)
SODIUM: 142 meq/L (ref 136–145)
Total Bilirubin: 0.53 mg/dL (ref 0.20–1.20)
Total Protein: 7.1 g/dL (ref 6.4–8.3)

## 2016-03-05 LAB — CBC WITH DIFFERENTIAL/PLATELET
BASO%: 1.5 % (ref 0.0–2.0)
BASOS ABS: 0.1 10*3/uL (ref 0.0–0.1)
EOS%: 2.2 % (ref 0.0–7.0)
Eosinophils Absolute: 0.1 10*3/uL (ref 0.0–0.5)
HEMATOCRIT: 34 % — AB (ref 34.8–46.6)
HGB: 11.3 g/dL — ABNORMAL LOW (ref 11.6–15.9)
LYMPH%: 39.4 % (ref 14.0–49.7)
MCH: 30.9 pg (ref 25.1–34.0)
MCHC: 33.4 g/dL (ref 31.5–36.0)
MCV: 92.4 fL (ref 79.5–101.0)
MONO#: 0.4 10*3/uL (ref 0.1–0.9)
MONO%: 6.6 % (ref 0.0–14.0)
NEUT#: 3.1 10*3/uL (ref 1.5–6.5)
NEUT%: 50.3 % (ref 38.4–76.8)
Platelets: 114 10*3/uL — ABNORMAL LOW (ref 145–400)
RBC: 3.68 10*6/uL — AB (ref 3.70–5.45)
RDW: 13.8 % (ref 11.2–14.5)
WBC: 6.1 10*3/uL (ref 3.9–10.3)
lymph#: 2.4 10*3/uL (ref 0.9–3.3)

## 2016-03-05 NOTE — Assessment & Plan Note (Signed)
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  Patient had second opinions at [facility], [facility] and at [facility] and finally decided to get treated here  Current treatment: Anastrozole 1 mg daily started 10/13/2013 MRI C-Spine 02/09/2015: Multiple osseous metastases are present. Foci of enhancement at C7, T2, T6, T7, T9, and T10.  Severe Anxiety: marked improvement We elected to not perform scans unless she has worsening symptoms Return to clinic in 6 months for follow-up

## 2016-03-05 NOTE — Progress Notes (Signed)
 Patient Care Team: Rafaela M Aguiar, MD as PCP - General (Family Medicine) Vinay Gudena, MD as Consulting Physician (Hematology and Oncology)  DIAGNOSIS:  Encounter Diagnoses  Name Primary?  . Malignant neoplasm of upper-outer quadrant of left breast in female, estrogen receptor positive (HCC)   . Anemia, unspecified type Yes    SUMMARY OF ONCOLOGIC HISTORY:   Breast cancer of upper-outer quadrant of left female breast ([facility])   01/18/2002 Initial Diagnosis    Breast cancer of upper-outer quadrant of left female breast; left mastectomy 2/26 lymph nodes positive ER positive received adjuvant TAC chemotherapy followed by radiation and tamoxifen for 9 months stopped for side effects      01/19/2011 Relapse/Recurrence    Right breast augmentation surgery was attempted by Dr. Lee and was found to have a lesion in the upper outer quadrant 3.3 x 1.8 cm biopsy revealed invasive ductal carcinoma with lobular features ER 100% PR 88% HER-2 negative      02/06/2011 Surgery    Right breast mastectomy at [facility] and axillary lymph node dissection with removal of breast implant followed by immediate reconstruction with tissue expander 36 lymph nodes were positive received radiation therapy and no chemotherapy      03/21/2011 - 04/20/2011 Radiation Therapy    Radiation to chest wall and axilla      05/29/2011 - 12/31/2011 Anti-estrogen oral therapy    Patient received Arimidex for one week and Zoladex x5 months stop Arimidex due to side effects      07/23/2013 Relapse/Recurrence    2 years of back pain and abdominal symptoms with spotting after intercourse revealed uterus enlarged biopsy revealed metastatic breast cancer lobular type: ER positive PR negative      09/18/2013 Surgery    Hysterectomy and bilateral salpingo-oophorectomy at [facility] revealed metastatic breast cancer involving tubes and uterus      10/13/2013 -  Anti-estrogen oral therapy    Femara for 30 days stopped for patchy alopecia and  patient going for second and third opinions between [facility], [facility] [place] regarding the appropriate antiestrogen plan      02/02/2015 Imaging    Multiple osseous metastases are present. Foci of enhancement are described at C7, T2, T6, T7, T9, and T10       CHIEF COMPLIANT: Follow-up of metastatic breast cancer with bone metastases  INTERVAL HISTORY: Cynthia Rosario is a 60-year-old with above-mentioned history metastatic breast cancer with extensive bone metastases who is currently on treatment with Femara. She appears to be tolerating Femara fairly well. She has noticed constipation but denies abdominal pain. Patient is very active with her horses.  REVIEW OF SYSTEMS:   Constitutional: Denies fevers, chills or abnormal weight loss Eyes: Denies blurriness of vision Ears, nose, mouth, throat, and face: Denies mucositis or sore throat Respiratory: Denies cough, dyspnea or wheezes Cardiovascular: Denies palpitation, chest discomfort Gastrointestinal:  Constipation Skin: Denies abnormal skin rashes Lymphatics: Denies new lymphadenopathy or easy bruising Neurological:Denies numbness, tingling or new weaknesses Behavioral/Psych: Mood is stable, no new changes  Extremities: No lower extremity edema Breast:  denies any pain or lumps or nodules in either breasts All other systems were reviewed with the patient and are negative.  I have reviewed the past medical history, past surgical history, social history and family history with the patient and they are unchanged from previous note.  ALLERGIES:  has No Known Allergies.  MEDICATIONS:  Current Outpatient Prescriptions  Medication Sig Dispense Refill  . anastrozole (ARIMIDEX) 1 MG tablet Take 1 tablet (  1 mg total) by mouth daily. 90 tablet 3  . aspirin EC 81 MG tablet Take by mouth.    . Calcium Carbonate-Vit D-Min (CALCIUM 600+D PLUS MINERALS) 600-400 MG-UNIT TABS Take by mouth.    . cyanocobalamin 500 MCG tablet Take 1 tablet (500 mcg  total) by mouth daily.    . ibuprofen (ADVIL,MOTRIN) 200 MG tablet Take 200 mg by mouth.    . Multiple Vitamins tablet Take 1 tablet by mouth.     No current facility-administered medications for this visit.     PHYSICAL EXAMINATION: ECOG PERFORMANCE STATUS: 1 - Symptomatic but completely ambulatory  Vitals:   03/05/16 1403  BP: 140/75  Pulse: 69  Resp: 18  Temp: 97.5 F (36.4 C)   Filed Weights   03/05/16 1403  Weight: 138 lb 8 oz (62.8 kg)    GENERAL:alert, no distress and comfortable SKIN: skin color, texture, turgor are normal, no rashes or significant lesions EYES: normal, Conjunctiva are pink and non-injected, sclera clear OROPHARYNX:no exudate, no erythema and lips, buccal mucosa, and tongue normal  NECK: supple, thyroid normal size, non-tender, without nodularity LYMPH:  no palpable lymphadenopathy in the cervical, axillary or inguinal LUNGS: clear to auscultation and percussion with normal breathing effort HEART: regular rate & rhythm and no murmurs and no lower extremity edema ABDOMEN:abdomen soft, non-tender and normal bowel sounds MUSCULOSKELETAL:no cyanosis of digits and no clubbing  NEURO: alert & oriented x 3 with fluent speech, no focal motor/sensory deficits EXTREMITIES: No lower extremity edema  LABORATORY DATA:  I have reviewed the data as listed   Chemistry      Component Value Date/Time   NA 142 03/05/2016 1348   K 4.4 03/05/2016 1348   CL 106 01/18/2010 1201   CO2 27 03/05/2016 1348   BUN 13.3 03/05/2016 1348   CREATININE 0.8 03/05/2016 1348      Component Value Date/Time   CALCIUM 9.8 03/05/2016 1348   ALKPHOS 110 03/05/2016 1348   AST 25 03/05/2016 1348   ALT 20 03/05/2016 1348   BILITOT 0.53 03/05/2016 1348       Lab Results  Component Value Date   WBC 6.1 03/05/2016   HGB 11.3 (L) 03/05/2016   HCT 34.0 (L) 03/05/2016   MCV 92.4 03/05/2016   PLT 114 (L) 03/05/2016   NEUTROABS 3.1 03/05/2016   ASSESSMENT & PLAN:  Breast  cancer of upper-outer quadrant of left female breast 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  Patient had second opinions at [facility], [facility] and at [facility] and finally decided to get treated here  Current treatment: Anastrozole 1 mg daily started 10/13/2013 MRI C-Spine 02/09/2015: Multiple osseous metastases are present. Foci of enhancement at C7, T2, T6, T7, T9, and T10. Constipation: I instructed the patient to take over-the-counter stool softeners daily.  Anemia and thrombocytopenia: I discussed with her the differential diagnosis. It could be nutritional versus bone marrow involvement with cancer. At this point we will watch and monitor this. We will repeat blood work in 3 months and follow-up after that.  Severe Anxiety: marked improvement We elected to not perform scans unless she has worsening symptoms Return to clinic in 6 months for follow-up and in 3 months for lab count check. I will discuss the results of the lab work on the telephone with the patient. I   instructed the patient to call us to discuss the report.   Orders Placed This Encounter  Procedures  . CBC with Differential    Standing Status:   Future    Standing Expiration Date:   03/05/2017  . Iron and TIBC    Standing Status:   Future    Standing Expiration Date:   03/05/2017  . Ferritin    Standing Status:   Future    Standing Expiration Date:   03/05/2017  . Vitamin B12    Standing Status:   Future    Standing Expiration Date:   03/05/2017  . Folate RBC    Standing Status:   Future    Standing Expiration Date:   03/05/2017   The patient has a good understanding of the overall plan. she agrees with it. she will call with any problems that may develop before the next visit here.   Gudena, Vinay K,  MD 03/05/16    

## 2016-03-07 ENCOUNTER — Telehealth: Payer: Self-pay | Admitting: Neurology

## 2016-03-07 NOTE — Telephone Encounter (Addendum)
Appointment Request From: Cynthia Rosario    With Provider: Star Age, MD [[facility]]    Preferred Date Range: Any date 03/07/2016 or later    Preferred Times: Any    Reason: To address the following health maintenance concerns.  Hepatitis C Screening  Tetanus/Tdap  Pap Smear  Colonoscopy  Mammogram  Influenza Vaccine    Comments:  I've had no breasts since 2012. Can you update your records? Kind of stupid to send me mammo reminder, isn't it?    Appointment Request From: Cynthia Rosario    With Provider: Star Age, MD [[facility]]    Preferred Date Range: Any date 03/07/2016 or later    Preferred Times: Any    Reason: To address the following health maintenance concerns.  Hepatitis C Screening  Tetanus/Tdap  Pap Smear  Colonoscopy  Mammogram  Influenza Vaccine    Comments:  Ditto on the Pap smear. I no longer have a cervix. Reminders should stop, don't ya think?    Appointment Request From: Cynthia Rosario    With Provider: Star Age, MD [[facility]]    Preferred Date Range: Any date 03/07/2016 or later    Preferred Times: Any    Reason: To address the following health maintenance concerns.  Hepatitis C Screening  Tetanus/Tdap  Pap Smear  Colonoscopy  Mammogram  Influenza Vaccine    Comments:  I doubt if want to see a neurologist for any of these tests. ?? Fix your system!   Phone Call to patient [phone] 03/07/16 4:38pm to advise, messages have been forwarded to nurse. Patient states, she keeps getting reminder messages through MyChart, patient appreciated my taking the time to call her.

## 2016-03-07 NOTE — Telephone Encounter (Signed)
Mastectomy and hysterectomy updated in pt's history.

## 2016-05-09 ENCOUNTER — Ambulatory Visit: Payer: BLUE CROSS/BLUE SHIELD | Admitting: Gynecologic Oncology

## 2016-05-24 NOTE — Progress Notes (Signed)
Consult Note: Gyn-Onc  Cynthia Rosario 61 y.o. female  CC:  Chief Complaint  Patient presents with  . Metastatic Breast Cancer    Follow up    HPI: Patient has a history of a node-positive, right-sided hormone receptor-positive, HER2-negative recurrent breast cancer with extensive adenopathy.   HISTORY OF PRESENT ILLNESS:  1. In 2003 diagnosis, stage II left-sided breast cancer at age of 5 and 2 of 64 lymph nodes positive. Tumor was ER positive. Received TAC chemotherapy, local regional radiation from Dr. Luetta Nutting and took tamoxifen for 9 months, discontinuing this with secondary side effects.  2. In 12/2010, the patient was seeing Dr. Truman Hayward for right breast augmentation and pt was noted to have a lesion in the upper outer quadrant than on imaging studies measured 3.3 x 1.8 cm, and a subsequent biopsy on 01/11/2011 showed an invasive ductal cancer with lobular features. It was 100% ER positive, 88% PR positive and HER-2 negative.  3. After discussion, the patient elected to have bilateral mastectomies and had a right skin-sparing mastectomy and sentinel node procedure and then a completion right axillary dissection and removal of her right breast implant. She had a right immediate reconstruction with a tissue expander.  4. Started then on Arimidex and Zoladex because of concerns about persistent ovarian function even though she meets postmenopausal criteria. Gonadotropins in 9/12 c/w menopausal. 5. 12/31/11 D/C Zolodex and continue on Arimidex.   6. She has a distant history of ovarian cyst was last seen by Dr. Clarene Essex in 2007. It appears that initially in 2003 shows small left ovarian mass measuring up to 3 cm it was partially cystic with features suggesting a dermoid. CA125 was normal. She then had ultrasounds and MRIs through September of 06 that showed the lesion measured 2 x 2 centimeters and 1.8 x 1.9 cm. Most recently in March of 2007 the cyst was 1.8 x 1.4 x 1.6 cm.   7. She presented with an  episode of spotting after intercourse as well as horseback riding. She underwent ultrasound on 07/23/2013. This revealed the uterus to be 4.2 x 5.2 x 4.9 cm. There was a simple follicle in the left ovary measuring 1.5 x 1.2 x 1.8 cm. There is no free fluid. In nature biopsy was performed that revealed polypoid fragments of endometrial tissue with involvement by poorly differentiated carcinoma. Based on the cytokeratin staining it was felt to be consistent with infiltrating carcinoma. Per pathology, they favored a breast primary.   8. She under went surgery on 09/18/13 and pathology revealed metastatic breast cancer (see above).  9. Multiple boney mets identified on 10/18 and treated with femara.  Diagnosis Endometrium, biopsy POLYPOID FRAGMENTS OF ENDOMETRIAL TISSUE WITH INVOLVEMENT BY POORLY DIFFERENTIATED CARCINOMA, SEE COMMENT. Microscopic Comment The sections show multiple fragments of polypoid endometrial tissue infiltrated in some foci by clusters and diffuse sheets of atypical epithelioid cells displaying mildly to moderately pleomorphic nuclei, abundant amphophilic to clear cytoplasm and small to inconspicuous nucleoli. Some of the atypical cells display cytoplasmic vacuoles. This is associated with only occasional mitoses. The endometrial glands in the background display lack of atypical features. In addition to polypoid fragments, there are non-polypoid weakly secretory type endometrium with breakdown. To further evaluate the infiltrative process, a large battery of immunohistochemical stains were performed. The atypical infiltrating cells stain as follows: Cytokeratin AE1/AE3 - positive Cytokeratin 7 - positive Cytokeratin 20 - focally positive GCDFP - weakly positive Estrogen receptor - positive Progesterone receptor - negative p16 - nonspecific background staining. TTF-1 -  negative WT-1 - negative CD10 - negative CD68 - negative CDX-2 - negative Vimentin - negative Smooth muscle  actin - negative The immunophenotypic features are consistent with an infiltrating carcinoma. The tumor shows predominant cytokeratin 7 positivity in addition to GCDFP and estrogen receptor positivity. Despite the focal cytokeratin 20 positivity, the overall features strongly favor a breast primary especially given the previously know clinical history of breast carcinoma. Dr. Nicoletta Dress reviewed this case and concurs.   She is a BRCA unknown status. She states it has been recommended to her in the past that she have testing but she has declined.    Interval History:  She had an uncomplicated surgical recovery.  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 She admits to poor compliance with that. Last imaging were spinal MRI's in October, 2016 which showed mets at multiple cervical and thoracic levels (C7, T2, T6, T7, T9, and T10).  She has noted increasing frequency of small volume defecation and sensation of inadequate emptying. Only occasional fecal incontinence (when has loose stools and can't get to bathroom in time). Occasional sensation of incomplete bladder emptying or difficulty initiating micturition (particularly when she has associated constipation). Denies urinary incontinence. No vaginal bleeding or pelvic pain.  Current Meds:  Outpatient Encounter Prescriptions as of 05/25/2016  Medication Sig  . Ascorbic Acid (VITAMIN C PO) Take by mouth daily.  . Cholecalciferol (D3 ADULT PO) Take by mouth daily.  . cyanocobalamin 500 MCG tablet Take 1 tablet (500 mcg total) by mouth daily.  Marland Kitchen ibuprofen (ADVIL,MOTRIN) 200 MG tablet Take 200 mg by mouth.  Marland Kitchen anastrozole (ARIMIDEX) 1 MG tablet Take 1 tablet (1 mg total) by mouth daily. (Patient not taking: Reported on 05/25/2016)  . aspirin EC 81 MG tablet Take by mouth.  . [DISCONTINUED] Calcium Carbonate-Vit D-Min (CALCIUM 600+D PLUS MINERALS) 600-400 MG-UNIT TABS Take by mouth.  . [DISCONTINUED]  Multiple Vitamins tablet Take 1 tablet by mouth.   No facility-administered encounter medications on file as of 05/25/2016.     Allergy: No Known Allergies  Social Hx:   Social History   Social History  . Marital status: Married    Spouse name: N/A  . Number of children: 2  . Years of education: master's   Occupational History  . not working right now    Social History Main Topics  . Smoking status: Former Research scientist (life sciences)  . Smokeless tobacco: Never Used  . Alcohol use No  . Drug use: No  . Sexual activity: Yes    Birth control/ protection: Condom   Other Topics Concern  . Not on file   Social History Narrative   Patient drinks 2 cups of caffeine daily.   Patient right handed.     Past Surgical Hx:  Past Surgical History:  Procedure Laterality Date  . ABDOMINAL HYSTERECTOMY  09/18/13   robotic hyst, bso for metastatic breast cancer, Dr Alycia Rossetti at [facility].  Marland Kitchen BREAST ENHANCEMENT SURGERY    . BREAST LUMPECTOMY     x2  . MASTECTOMY     x2  . RECONSTRUCTION BREAST W/ TRAM FLAP    . WISDOM TOOTH EXTRACTION      Past Medical Hx:  Past Medical History:  Diagnosis Date  . Abnormal pap   . Breast cancer ([place])   . Cancer (H[facility])    breast  . Cervical polyp   . Dermoid   . Fibroid   . Kidney stones   . Osteopenia   .  Ovarian cyst   . Paresthesia 04/07/2015  . Urinary urgency     Oncology Hx:    Breast cancer of upper-outer quadrant of left female breast ([place])   01/18/2002 Initial Diagnosis    Breast cancer of upper-outer quadrant of left female breast; left mastectomy 2/26 lymph nodes positive ER positive received adjuvant TAC chemotherapy followed by radiation and tamoxifen for 9 months stopped for side effects      01/19/2011 Relapse/Recurrence    Right breast augmentation surgery was attempted by Dr. Truman Hayward and was found to have a lesion in the upper outer quadrant 3.3 x 1.8 cm biopsy revealed invasive ductal carcinoma with lobular features ER 100% PR 88% HER-2 negative       02/06/2011 Surgery    Right breast mastectomy at [facility] and axillary lymph node dissection with removal of breast implant followed by immediate reconstruction with tissue expander 36 lymph nodes were positive received radiation therapy and no chemotherapy      03/21/2011 - 04/20/2011 Radiation Therapy    Radiation to chest wall and axilla      05/29/2011 - 12/31/2011 Anti-estrogen oral therapy    Patient received Arimidex for one week and Zoladex x5 months stop Arimidex due to side effects      07/23/2013 Relapse/Recurrence    2 years of back pain and abdominal symptoms with spotting after intercourse revealed uterus enlarged biopsy revealed metastatic breast cancer lobular type: ER positive PR negative      09/18/2013 Surgery    Hysterectomy and bilateral salpingo-oophorectomy at [facility] revealed metastatic breast cancer involving tubes and uterus      10/13/2013 -  Anti-estrogen oral therapy    Femara for 30 days stopped for patchy alopecia and patient going for second and third opinions between [place], [place] regarding the appropriate antiestrogen plan      02/02/2015 Imaging    Multiple osseous metastases are present. Foci of enhancement are described at C7, T2, T6, T7, T9, and T10       Family Hx:  Family History  Problem Relation Age of Onset  . Heart disease Father   . Hypertension Mother     Vitals:  Blood pressure 130/82, pulse 82, temperature 97.4 F (36.3 C), temperature source Oral, resp. rate 18, height _0  (1.727 m), weight 135 lb 9.6 oz (61.5 kg), SpO2 100 %.  Physical Exam: Well-nourished well-developed female in no acute distress.  Neck: Supple, no lymphadenopathy no thyromegaly.  Lungs: Clear to auscultation bilaterally.  Cardiovascular: Tachycardic regular rhythm. No murmur.  Abdomen: Soft, nontender, nondistended. There no palpable masses or hepatosplenomegaly.  Groins: Node lymphadenopathy.  Extremities: No edema.  Pelvic: Normal external  female genitalia. The cervix and uterus are surgically absent. The vaginal cuff is intact and well healed on visual exam and palpation. There are atrophic changes to the vaginal mucosa. There is no gross prolapse at rest or with valsalva and no fecal or urinary incontinence with supine stress test. On rectovaginal exam I can appreciate some laxity to the posterior wall of the vagina, but this does not extend to the vaginal introitus. There is palpably normal rectal tone. There are no pelvic masses appreciated on bimanual exam.  Assessment/Plan: 60 year old with history of breast cancer who has recurrent metastatic breast cancer.  She has new rectal and urinary symptoms (which include difficulty initiating urination and frequent incomplete defecation). This may be secondary to rectocele. It may also be a function of central neurologic impairment. Given her history of spinal  metastases I am recommending PET/CT to evaluate for sacral spine disease or pelvic masses. If no apparent neurologic etiology is identified, I think she would benefit from consultation with a relevant specialist As I do not treat these I have referred her to pelvic reconstructive surgeon, Dr Maryland Pink for evaluation and treatment.  Donaciano Eva, MD 05/25/2016, 2:37 PM

## 2016-05-25 ENCOUNTER — Ambulatory Visit: Payer: BLUE CROSS/BLUE SHIELD | Attending: Gynecologic Oncology | Admitting: Gynecologic Oncology

## 2016-05-25 ENCOUNTER — Encounter: Payer: Self-pay | Admitting: Gynecologic Oncology

## 2016-05-25 VITALS — BP 130/82 | HR 82 | Temp 97.4°F | Resp 18 | Ht 68.0 in | Wt 135.6 lb

## 2016-05-25 DIAGNOSIS — R3911 Hesitancy of micturition: Secondary | ICD-10-CM | POA: Diagnosis not present

## 2016-05-25 DIAGNOSIS — C7951 Secondary malignant neoplasm of bone: Secondary | ICD-10-CM | POA: Insufficient documentation

## 2016-05-25 DIAGNOSIS — M8580 Other specified disorders of bone density and structure, unspecified site: Secondary | ICD-10-CM | POA: Diagnosis not present

## 2016-05-25 DIAGNOSIS — Z8249 Family history of ischemic heart disease and other diseases of the circulatory system: Secondary | ICD-10-CM | POA: Diagnosis not present

## 2016-05-25 DIAGNOSIS — Z90710 Acquired absence of both cervix and uterus: Secondary | ICD-10-CM | POA: Insufficient documentation

## 2016-05-25 DIAGNOSIS — Z9221 Personal history of antineoplastic chemotherapy: Secondary | ICD-10-CM | POA: Diagnosis not present

## 2016-05-25 DIAGNOSIS — Z853 Personal history of malignant neoplasm of breast: Secondary | ICD-10-CM | POA: Diagnosis not present

## 2016-05-25 DIAGNOSIS — Z170 Estrogen receptor positive status [ER+]: Secondary | ICD-10-CM

## 2016-05-25 DIAGNOSIS — Z923 Personal history of irradiation: Secondary | ICD-10-CM | POA: Insufficient documentation

## 2016-05-25 DIAGNOSIS — Z87891 Personal history of nicotine dependence: Secondary | ICD-10-CM | POA: Insufficient documentation

## 2016-05-25 DIAGNOSIS — R198 Other specified symptoms and signs involving the digestive system and abdomen: Secondary | ICD-10-CM | POA: Insufficient documentation

## 2016-05-25 DIAGNOSIS — R150 Incomplete defecation: Secondary | ICD-10-CM

## 2016-05-25 DIAGNOSIS — Z9013 Acquired absence of bilateral breasts and nipples: Secondary | ICD-10-CM | POA: Insufficient documentation

## 2016-05-25 DIAGNOSIS — Z90722 Acquired absence of ovaries, bilateral: Secondary | ICD-10-CM | POA: Insufficient documentation

## 2016-05-25 DIAGNOSIS — Z7982 Long term (current) use of aspirin: Secondary | ICD-10-CM | POA: Diagnosis not present

## 2016-05-25 DIAGNOSIS — Z87442 Personal history of urinary calculi: Secondary | ICD-10-CM | POA: Diagnosis not present

## 2016-05-25 DIAGNOSIS — C50412 Malignant neoplasm of upper-outer quadrant of left female breast: Secondary | ICD-10-CM

## 2016-05-25 DIAGNOSIS — C50919 Malignant neoplasm of unspecified site of unspecified female breast: Secondary | ICD-10-CM

## 2016-05-25 NOTE — Patient Instructions (Signed)
Plan to have a PET scan on 06/05/16 at [place] at [facility] with arrival time of 7:30am.  Nothing to eat or drink except plain water after midnight.    You will also receive a phone call about an appointment to meet with Dr. Maryland Pink, UroGynecologist.  Please call our office for any questions or concerns at [phone].

## 2016-05-28 ENCOUNTER — Telehealth: Payer: Self-pay | Admitting: Gynecologic Oncology

## 2016-05-28 NOTE — Telephone Encounter (Signed)
Peer to peer performed with authorization # of [id number] good until March 3.  Message sent to [facility] with Prior Auths.

## 2016-06-04 ENCOUNTER — Other Ambulatory Visit (HOSPITAL_BASED_OUTPATIENT_CLINIC_OR_DEPARTMENT_OTHER): Payer: BLUE CROSS/BLUE SHIELD

## 2016-06-04 DIAGNOSIS — D649 Anemia, unspecified: Secondary | ICD-10-CM

## 2016-06-04 LAB — CBC WITH DIFFERENTIAL/PLATELET
BASO%: 2.1 % — ABNORMAL HIGH (ref 0.0–2.0)
BASOS ABS: 0.1 10*3/uL (ref 0.0–0.1)
EOS ABS: 0 10*3/uL (ref 0.0–0.5)
EOS%: 1.1 % (ref 0.0–7.0)
HCT: 34.5 % — ABNORMAL LOW (ref 34.8–46.6)
HGB: 11.6 g/dL (ref 11.6–15.9)
LYMPH%: 43.3 % (ref 14.0–49.7)
MCH: 31.5 pg (ref 25.1–34.0)
MCHC: 33.5 g/dL (ref 31.5–36.0)
MCV: 94.1 fL (ref 79.5–101.0)
MONO#: 0.3 10*3/uL (ref 0.1–0.9)
MONO%: 7.6 % (ref 0.0–14.0)
NEUT#: 2 10*3/uL (ref 1.5–6.5)
NEUT%: 45.9 % (ref 38.4–76.8)
PLATELETS: 143 10*3/uL — AB (ref 145–400)
RBC: 3.67 10*6/uL — ABNORMAL LOW (ref 3.70–5.45)
RDW: 14.1 % (ref 11.2–14.5)
WBC: 4.4 10*3/uL (ref 3.9–10.3)
lymph#: 1.9 10*3/uL (ref 0.9–3.3)

## 2016-06-04 LAB — IRON AND TIBC
%SAT: 23 % (ref 21–57)
Iron: 95 ug/dL (ref 41–142)
TIBC: 410 ug/dL (ref 236–444)
UIBC: 315 ug/dL (ref 120–384)

## 2016-06-04 LAB — FERRITIN: Ferritin: 129 ng/ml (ref 9–269)

## 2016-06-05 ENCOUNTER — Ambulatory Visit (HOSPITAL_COMMUNITY): Payer: BLUE CROSS/BLUE SHIELD

## 2016-06-05 LAB — FOLATE RBC
FOLATE, HEMOLYSATE: 287.9 ng/mL
FOLATE, RBC: 832 ng/mL (ref >498)
HEMATOCRIT: 34.6 % (ref 34.0–46.6)

## 2016-06-05 LAB — VITAMIN B12: VITAMIN B 12: 757 pg/mL (ref 232–1245)

## 2016-09-03 ENCOUNTER — Other Ambulatory Visit (HOSPITAL_BASED_OUTPATIENT_CLINIC_OR_DEPARTMENT_OTHER): Payer: BLUE CROSS/BLUE SHIELD

## 2016-09-03 ENCOUNTER — Encounter: Payer: Self-pay | Admitting: Hematology and Oncology

## 2016-09-03 ENCOUNTER — Other Ambulatory Visit: Payer: Self-pay | Admitting: Emergency Medicine

## 2016-09-03 ENCOUNTER — Ambulatory Visit (HOSPITAL_BASED_OUTPATIENT_CLINIC_OR_DEPARTMENT_OTHER): Payer: BLUE CROSS/BLUE SHIELD | Admitting: Hematology and Oncology

## 2016-09-03 DIAGNOSIS — C50412 Malignant neoplasm of upper-outer quadrant of left female breast: Secondary | ICD-10-CM

## 2016-09-03 DIAGNOSIS — C7951 Secondary malignant neoplasm of bone: Secondary | ICD-10-CM

## 2016-09-03 DIAGNOSIS — Z170 Estrogen receptor positive status [ER+]: Principal | ICD-10-CM

## 2016-09-03 DIAGNOSIS — R52 Pain, unspecified: Secondary | ICD-10-CM

## 2016-09-03 DIAGNOSIS — F419 Anxiety disorder, unspecified: Secondary | ICD-10-CM

## 2016-09-03 LAB — CBC WITH DIFFERENTIAL/PLATELET
BASO%: 1.3 % (ref 0.0–2.0)
BASOS ABS: 0.1 10*3/uL (ref 0.0–0.1)
EOS ABS: 0 10*3/uL (ref 0.0–0.5)
EOS%: 0.6 % (ref 0.0–7.0)
HCT: 35.9 % (ref 34.8–46.6)
HGB: 12 g/dL (ref 11.6–15.9)
LYMPH%: 38.4 % (ref 14.0–49.7)
MCH: 31.5 pg (ref 25.1–34.0)
MCHC: 33.4 g/dL (ref 31.5–36.0)
MCV: 94.3 fL (ref 79.5–101.0)
MONO#: 0.4 10*3/uL (ref 0.1–0.9)
MONO%: 6.5 % (ref 0.0–14.0)
NEUT%: 53.2 % (ref 38.4–76.8)
NEUTROS ABS: 3.3 10*3/uL (ref 1.5–6.5)
Platelets: 176 10*3/uL (ref 145–400)
RBC: 3.8 10*6/uL (ref 3.70–5.45)
RDW: 14.1 % (ref 11.2–14.5)
WBC: 6.1 10*3/uL (ref 3.9–10.3)
lymph#: 2.4 10*3/uL (ref 0.9–3.3)

## 2016-09-03 LAB — COMPREHENSIVE METABOLIC PANEL
ALT: 11 U/L (ref 0–55)
AST: 20 U/L (ref 5–34)
Albumin: 4.2 g/dL (ref 3.5–5.0)
Alkaline Phosphatase: 101 U/L (ref 40–150)
Anion Gap: 8 mEq/L (ref 3–11)
BUN: 16.6 mg/dL (ref 7.0–26.0)
CO2: 29 meq/L (ref 22–29)
Calcium: 9.9 mg/dL (ref 8.4–10.4)
Chloride: 104 mEq/L (ref 98–109)
Creatinine: 0.8 mg/dL (ref 0.6–1.1)
EGFR: 81 mL/min/{1.73_m2} — AB (ref >90)
GLUCOSE: 94 mg/dL (ref 70–140)
POTASSIUM: 4.3 meq/L (ref 3.5–5.1)
SODIUM: 142 meq/L (ref 136–145)
Total Bilirubin: 0.53 mg/dL (ref 0.20–1.20)
Total Protein: 7.3 g/dL (ref 6.4–8.3)

## 2016-09-03 NOTE — Assessment & Plan Note (Signed)
Metastatic invasive lobular breast cancerER positive PR negative HER-2 unknown (previously Neg); metastatic disease involving uterus and ovaries status post hysterectomy and BSO, bone metastases ( detected [facility] 2015).  Patient had second opinions at [facility], [facility] and at [facility] and finally decided to get treated here  Current treatment: Anastrozole 1 mg daily started 10/13/2013 MRI C-Spine 02/09/2015: Multiple osseous metastases are present. Foci of enhancement at C7, T2, T6, T7, T9, and T10. Constipation: I instructed the patient to take over-the-counter stool softeners daily.  Anemia and thrombocytopenia: I discussed with her the differential diagnosis. It could be nutritional versus bone marrow involvement with cancer. Blood work revealed hemoglobin of 11.6 and platelet count of 143 (improved from 114). This lab work was done February 2018.  Severe Anxiety: marked improvement We elected to not perform scans unless she has worsening symptoms Return to clinic in 6 months for follow-up

## 2016-09-03 NOTE — Progress Notes (Signed)
Patient Care Team: [name], MD as PCP - General (Family Medicine) [name], MD as Consulting Physician (Hematology and Oncology)  DIAGNOSIS:  Encounter Diagnosis  Name Primary?  . Malignant neoplasm of upper-outer quadrant of left breast in female, estrogen receptor positive ([place])     SUMMARY OF ONCOLOGIC HISTORY:   Breast cancer of upper-outer quadrant of left female breast ([place])   01/18/2002 Initial Diagnosis    Breast cancer of upper-outer quadrant of left female breast; left mastectomy 2/26 lymph nodes positive ER positive received adjuvant TAC chemotherapy followed by radiation and tamoxifen for 9 months stopped for side effects      01/19/2011 Relapse/Recurrence    Right breast augmentation surgery was attempted by Dr. Truman Hayward and was found to have a lesion in the upper outer quadrant 3.3 x 1.8 cm biopsy revealed invasive ductal carcinoma with lobular features ER 100% PR 88% HER-2 negative      02/06/2011 Surgery    Right breast mastectomy at [facility] and axillary lymph node dissection with removal of breast implant followed by immediate reconstruction with tissue expander 36 lymph nodes were positive received radiation therapy and no chemotherapy      03/21/2011 - 04/20/2011 Radiation Therapy    Radiation to chest wall and axilla      05/29/2011 - 12/31/2011 Anti-estrogen oral therapy    Patient received Arimidex for one week and Zoladex x5 months stop Arimidex due to side effects      07/23/2013 Relapse/Recurrence    2 years of back pain and abdominal symptoms with spotting after intercourse revealed uterus enlarged biopsy revealed metastatic breast cancer lobular type: ER positive PR negative      09/18/2013 Surgery    Hysterectomy and bilateral salpingo-oophorectomy at [facility] revealed metastatic breast cancer involving tubes and uterus      10/13/2013 - 10/20/2013 Anti-estrogen oral therapy    Femara for 30 days stopped for patchy alopecia and patient going for second  and third opinions between [place], [place], [place] regarding the appropriate antiestrogen plan (stopped by choice)      02/02/2015 Imaging    Multiple osseous metastases are present. Foci of enhancement are described at C7, T2, T6, T7, T9, and T10       CHIEF COMPLIANT: Follow-up of metastatic breast cancer on Femara  INTERVAL HISTORY: Cynthia Rosario is a 61 year old above-mentioned history of metastatic breast cancer. She is here for a follow-up. She was also noted to have anemia and thrombocytopenia both of which have resolved. She tells me today that she has never taken Femara. I was under the impression that she had been taking Femara all along. She somehow decided to hold back on this information all this time. She does complain of occasional aches and pains but nothing too serious. She worries that she may have broken some ribs and she is also anxious about her thoracic spine because of the lesions in the bone. She truly believes in natural remedies like eating healthy and exercising regularly as a way to treat her breast cancer and not to antiestrogen therapy.  REVIEW OF SYSTEMS:   Constitutional: Denies fevers, chills or abnormal weight loss Eyes: Denies blurriness of vision Ears, nose, mouth, throat, and face: Denies mucositis or sore throat Respiratory: Denies cough, dyspnea or wheezes Cardiovascular: Denies palpitation, chest discomfort Gastrointestinal:  Denies nausea, heartburn or change in bowel habits Skin: Denies abnormal skin rashes Lymphatics: Denies new lymphadenopathy or easy bruising Neurological:Denies numbness, tingling or new weaknesses Behavioral/Psych: Mood is stable,  no new changes  Extremities: No lower extremity edema Breast:  denies any pain or lumps or nodules in either breasts All other systems were reviewed with the patient and are negative.  I have reviewed the past medical history, past surgical history, social history and family history with the patient  and they are unchanged from previous note.  ALLERGIES:  has No Known Allergies.  MEDICATIONS:  Current Outpatient Prescriptions  Medication Sig Dispense Refill  . ibuprofen (ADVIL,MOTRIN) 200 MG tablet Take 200 mg by mouth.     No current facility-administered medications for this visit.     PHYSICAL EXAMINATION: ECOG PERFORMANCE STATUS: 1 - Symptomatic but completely ambulatory  Vitals:   09/03/16 1454  BP: 134/76  Pulse: 74  Resp: 20  Temp: 98.1 F (36.7 C)   Filed Weights   09/03/16 1454  Weight: 130 lb 12.8 oz (59.3 kg)    GENERAL:alert, no distress and comfortable SKIN: skin color, texture, turgor are normal, no rashes or significant lesions EYES: normal, Conjunctiva are pink and non-injected, sclera clear OROPHARYNX:no exudate, no erythema and lips, buccal mucosa, and tongue normal  NECK: supple, thyroid normal size, non-tender, without nodularity LYMPH:  no palpable lymphadenopathy in the cervical, axillary or inguinal LUNGS: clear to auscultation and percussion with normal breathing effort HEART: regular rate & rhythm and no murmurs and no lower extremity edema ABDOMEN:abdomen soft, non-tender and normal bowel sounds MUSCULOSKELETAL:no cyanosis of digits and no clubbing  NEURO: alert & oriented x 3 with fluent speech, no focal motor/sensory deficits EXTREMITIES: No lower extremity edema BREAST: No palpable masses or nodules in either right or left breasts. No palpable axillary supraclavicular or infraclavicular adenopathy no breast tenderness or nipple discharge. (exam performed in the presence of a chaperone)  LABORATORY DATA:  I have reviewed the data as listed   Chemistry      Component Value Date/Time   NA 142 09/03/2016 1440   K 4.3 09/03/2016 1440   CL 106 01/18/2010 1201   CO2 29 09/03/2016 1440   BUN 16.6 09/03/2016 1440   CREATININE 0.8 09/03/2016 1440      Component Value Date/Time   CALCIUM 9.9 09/03/2016 1440   ALKPHOS 101 09/03/2016 1440    AST 20 09/03/2016 1440   ALT 11 09/03/2016 1440   BILITOT 0.53 09/03/2016 1440       Lab Results  Component Value Date   WBC 6.1 09/03/2016   HGB 12.0 09/03/2016   HCT 35.9 09/03/2016   MCV 94.3 09/03/2016   PLT 176 09/03/2016   NEUTROABS 3.3 09/03/2016    ASSESSMENT & PLAN:  Breast cancer of upper-outer quadrant of left female breast Metastatic invasive lobular breast cancerER positive PR negative HER-2 unknown (previously Neg); metastatic disease involving uterus and ovaries status post hysterectomy and BSO, bone metastases ( [facility] November 2015).  Patient had second opinions at [facility], [facility] and at [facility] and finally decided to get treated here  Prior treatment: Anastrozole 1 mg daily started 10/13/2013 Discontinued 10/20/2013 after taking it for only a week. This is not because of side effects but by her own personal preference.  MRI C-Spine 02/09/2015: Multiple osseous metastases are present. Foci of enhancement at C7, T2, T6, T7, T9, and T10. Constipation: I instructed the patient to take over-the-counter stool softeners daily.  Anemia and thrombocytopenia:Blood work revealed hemoglobin of 12 and platelet count of 176 (improved from 114).   Severe Anxiety: marked improvement After much discussion she decided  that she would like to do an MRI of the thoracic spine in October 2018. Return to clinic in 6 months for follow-up after the MRI spine.   I spent 25 minutes talking to the patient of which more than half was spent in counseling and coordination of care.  Orders Placed This Encounter  Procedures  . MR THORACIC SPINE W WO CONTRAST    Standing Status:   Future    Standing Expiration Date:   11/03/2017    Order Specific Question:   GRA to provide read?    Answer:   Yes    Order Specific Question:   If indicated for the ordered procedure, I authorize the administration of contrast media per Radiology protocol    Answer:   Yes     Order Specific Question:   Reason for Exam (SYMPTOM  OR DIAGNOSIS REQUIRED)    Answer:   Bone mets evaluation from breast cancer    Order Specific Question:   What is the patient's sedation requirement?    Answer:   No Sedation    Order Specific Question:   Does the patient have a pacemaker or implanted devices?    Answer:   No    Order Specific Question:   Preferred imaging location?    Answer:   [facility] (table limit-350 lbs)    Order Specific Question:   Radiology Contrast Protocol - do NOT remove file path    Answer:   [email or web address]  . CBC with Differential/Platelet    Standing Status:   Future    Standing Expiration Date:   10/08/2017  . Comprehensive metabolic panel    Standing Status:   Future    Standing Expiration Date:   09/03/2017   The patient has a good understanding of the overall plan. she agrees with it. she will call with any problems that may develop before the next visit here.   Rulon Eisenmenger, MD 09/03/16

## 2016-11-28 ENCOUNTER — Telehealth: Payer: Self-pay | Admitting: *Deleted

## 2016-11-28 NOTE — Telephone Encounter (Addendum)
"  I need a letter written to get out of [employer].  Could I pick this up by the end of the week?  No way I can get up and sit through this.  Feel bad having GI issues and symptoms.  Return number [phone]."  Routing to provider and collaborative for further review, communication thru collaborative.

## 2016-11-29 ENCOUNTER — Encounter: Payer: Self-pay | Admitting: Emergency Medicine

## 2016-11-29 ENCOUNTER — Telehealth: Payer: Self-pay | Admitting: Emergency Medicine

## 2016-11-29 NOTE — Telephone Encounter (Signed)
Called patient back to see what her GI issues were. VM left to call back.

## 2016-11-29 NOTE — Telephone Encounter (Signed)
Will return pt call and discuss if she needs to get letter from [place].

## 2017-02-28 ENCOUNTER — Telehealth: Payer: Self-pay

## 2017-02-28 NOTE — Telephone Encounter (Signed)
Left VM for pt regarding the MRI of spine pt wanted to have done in Oct 2018. Radiology scheduling has been trying to contact her for an appt to schedule this. Informed her of this. Call back number to scheduling provided.  Cyndia Bent RN

## 2017-03-05 ENCOUNTER — Ambulatory Visit (HOSPITAL_BASED_OUTPATIENT_CLINIC_OR_DEPARTMENT_OTHER): Payer: BLUE CROSS/BLUE SHIELD

## 2017-03-05 ENCOUNTER — Telehealth: Payer: Self-pay | Admitting: Hematology and Oncology

## 2017-03-05 ENCOUNTER — Ambulatory Visit (HOSPITAL_BASED_OUTPATIENT_CLINIC_OR_DEPARTMENT_OTHER): Payer: BLUE CROSS/BLUE SHIELD | Admitting: Hematology and Oncology

## 2017-03-05 DIAGNOSIS — K589 Irritable bowel syndrome without diarrhea: Secondary | ICD-10-CM

## 2017-03-05 DIAGNOSIS — F419 Anxiety disorder, unspecified: Secondary | ICD-10-CM

## 2017-03-05 DIAGNOSIS — Z170 Estrogen receptor positive status [ER+]: Secondary | ICD-10-CM

## 2017-03-05 DIAGNOSIS — D696 Thrombocytopenia, unspecified: Secondary | ICD-10-CM | POA: Diagnosis not present

## 2017-03-05 DIAGNOSIS — D649 Anemia, unspecified: Secondary | ICD-10-CM | POA: Diagnosis not present

## 2017-03-05 DIAGNOSIS — C7951 Secondary malignant neoplasm of bone: Secondary | ICD-10-CM

## 2017-03-05 DIAGNOSIS — C50412 Malignant neoplasm of upper-outer quadrant of left female breast: Secondary | ICD-10-CM

## 2017-03-05 LAB — CBC WITH DIFFERENTIAL/PLATELET
BASO%: 0.5 % (ref 0.0–2.0)
Basophils Absolute: 0 10*3/uL (ref 0.0–0.1)
EOS ABS: 0.1 10*3/uL (ref 0.0–0.5)
EOS%: 0.8 % (ref 0.0–7.0)
HEMATOCRIT: 32.2 % — AB (ref 34.8–46.6)
HGB: 10.5 g/dL — ABNORMAL LOW (ref 11.6–15.9)
LYMPH#: 2.1 10*3/uL (ref 0.9–3.3)
LYMPH%: 32.4 % (ref 14.0–49.7)
MCH: 31.2 pg (ref 25.1–34.0)
MCHC: 32.6 g/dL (ref 31.5–36.0)
MCV: 95.5 fL (ref 79.5–101.0)
MONO#: 0.5 10*3/uL (ref 0.1–0.9)
MONO%: 7.4 % (ref 0.0–14.0)
NEUT#: 3.9 10*3/uL (ref 1.5–6.5)
NEUT%: 58.9 % (ref 38.4–76.8)
PLATELETS: 242 10*3/uL (ref 145–400)
RBC: 3.37 10*6/uL — ABNORMAL LOW (ref 3.70–5.45)
RDW: 13.6 % (ref 11.2–14.5)
WBC: 6.6 10*3/uL (ref 3.9–10.3)

## 2017-03-05 LAB — COMPREHENSIVE METABOLIC PANEL
ALT: 10 U/L (ref 0–55)
ANION GAP: 8 meq/L (ref 3–11)
AST: 21 U/L (ref 5–34)
Albumin: 3.5 g/dL (ref 3.5–5.0)
Alkaline Phosphatase: 94 U/L (ref 40–150)
BUN: 17 mg/dL (ref 7.0–26.0)
CALCIUM: 9.2 mg/dL (ref 8.4–10.4)
CHLORIDE: 107 meq/L (ref 98–109)
CO2: 26 mEq/L (ref 22–29)
CREATININE: 0.8 mg/dL (ref 0.6–1.1)
EGFR: >60 mL/min/{1.73_m2} (ref >60)
Glucose: 91 mg/dl (ref 70–140)
Potassium: 3.9 mEq/L (ref 3.5–5.1)
Sodium: 141 mEq/L (ref 136–145)
Total Bilirubin: 0.38 mg/dL (ref 0.20–1.20)
Total Protein: 6.6 g/dL (ref 6.4–8.3)

## 2017-03-05 NOTE — Telephone Encounter (Signed)
Gave patient avs and calendar with appts per 11/13 los.

## 2017-03-05 NOTE — Progress Notes (Signed)
Patient Care Team: [name], MD as PCP - General (Family Medicine) [name], MD as Consulting Physician (Hematology and Oncology)  DIAGNOSIS:  Encounter Diagnosis  Name Primary?  . Malignant neoplasm of upper-outer quadrant of left breast in female, estrogen receptor positive ([place])     SUMMARY OF ONCOLOGIC HISTORY:   Breast cancer of upper-outer quadrant of left female breast ([name])   01/18/2002 Initial Diagnosis    Breast cancer of upper-outer quadrant of left female breast; left mastectomy 2/26 lymph nodes positive ER positive received adjuvant TAC chemotherapy followed by radiation and tamoxifen for 9 months stopped for side effects      01/19/2011 Relapse/Recurrence    Right breast augmentation surgery was attempted by Dr. Truman Hayward and was found to have a lesion in the upper outer quadrant 3.3 x 1.8 cm biopsy revealed invasive ductal carcinoma with lobular features ER 100% PR 88% HER-2 negative      02/06/2011 Surgery    Right breast mastectomy at [facility] and axillary lymph node dissection with removal of breast implant followed by immediate reconstruction with tissue expander 36 lymph nodes were positive received radiation therapy and no chemotherapy      03/21/2011 - 04/20/2011 Radiation Therapy    Radiation to chest wall and axilla      05/29/2011 - 12/31/2011 Anti-estrogen oral therapy    Patient received Arimidex for one week and Zoladex x5 months stop Arimidex due to side effects      07/23/2013 Relapse/Recurrence    2 years of back pain and abdominal symptoms with spotting after intercourse revealed uterus enlarged biopsy revealed metastatic breast cancer lobular type: ER positive PR negative      09/18/2013 Surgery    Hysterectomy and bilateral salpingo[facility]oophorectomy at [facility] revealed metastatic breast cancer involving tubes and uterus      10/13/2013 - 10/20/2013 Anti-estrogen oral therapy    Femara for 30 days stopped for patchy alopecia and patient going for second  and third opinions between [place], [place], [place] regarding the appropriate antiestrogen plan (stopped by choice)      02/02/2015 Imaging    Multiple osseous metastases are present. Foci of enhancement are described at C7, T2, T6, T7, T9, and T10      CHIEF COMPLIANT: Follow-up of metastatic breast cancer  INTERVAL HISTORY: Cynthia Rosario is a 61 year old with above-mentioned history metastatic breast cancer who is currently off antiestrogen therapy by her preference.  She has not had any scans either.  REVIEW OF SYSTEMS:   Constitutional: Denies fevers, chills or abnormal weight loss Eyes: Denies blurriness of vision Ears, nose, mouth, throat, and face: Denies mucositis or sore throat Respiratory: Denies cough, dyspnea or wheezes Cardiovascular: Denies palpitation, chest discomfort Gastrointestinal:  Denies nausea, heartburn or change in bowel habits Skin: Denies abnormal skin rashes Lymphatics: Denies new lymphadenopathy or easy bruising Neurological:Denies numbness, tingling or new weaknesses Behavioral/Psych: Mood is stable, no new changes  Extremities: No lower extremity edema Breast:  denies any pain or lumps or nodules in either breasts All other systems were reviewed with the patient and are negative.  I have reviewed the past medical history, past surgical history, social history and family history with the patient and they are unchanged from previous note.  ALLERGIES:  has No Known Allergies.  MEDICATIONS:  Current Outpatient Medications  Medication Sig Dispense Refill  . ibuprofen (ADVIL,MOTRIN) 200 MG tablet Take 200 mg by mouth.     No current facility-administered medications for this visit.  PHYSICAL EXAMINATION: ECOG PERFORMANCE STATUS: 1 - Symptomatic but completely ambulatory  Vitals:   03/05/17 1407  BP: 139/71  Pulse: 81  Resp: 18  Temp: 98.1 F (36.7 C)  SpO2: 100%   Filed Weights   03/05/17 1407  Weight: 123 lb 9.6 oz (56.1 kg)     GENERAL:alert, no distress and comfortable SKIN: skin color, texture, turgor are normal, no rashes or significant lesions EYES: normal, Conjunctiva are pink and non-injected, sclera clear OROPHARYNX:no exudate, no erythema and lips, buccal mucosa, and tongue normal  NECK: supple, thyroid normal size, non-tender, without nodularity LYMPH:  no palpable lymphadenopathy in the cervical, axillary or inguinal LUNGS: clear to auscultation and percussion with normal breathing effort HEART: regular rate & rhythm and no murmurs and no lower extremity edema ABDOMEN:abdomen soft, non-tender and normal bowel sounds MUSCULOSKELETAL:no cyanosis of digits and no clubbing  NEURO: alert & oriented x 3 with fluent speech, no focal motor/sensory deficits EXTREMITIES: No lower extremity edema  LABORATORY DATA:  I have reviewed the data as listed   Chemistry      Component Value Date/Time   NA 141 03/05/2017 1445   K 3.9 03/05/2017 1445   CL 106 01/18/2010 1201   CO2 26 03/05/2017 1445   BUN 17.0 03/05/2017 1445   CREATININE 0.8 03/05/2017 1445      Component Value Date/Time   CALCIUM 9.2 03/05/2017 1445   ALKPHOS 94 03/05/2017 1445   AST 21 03/05/2017 1445   ALT 10 03/05/2017 1445   BILITOT 0.38 03/05/2017 1445       Lab Results  Component Value Date   WBC 6.6 03/05/2017   HGB 10.5 (L) 03/05/2017   HCT 32.2 (L) 03/05/2017   MCV 95.5 03/05/2017   PLT 242 03/05/2017   NEUTROABS 3.9 03/05/2017    ASSESSMENT & PLAN:  Breast cancer of upper-outer quadrant of left female breast 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  Patient had second opinions at [facility], [facility] and at [facility] and finally decided to get treated here  Prior treatment: Anastrozole 1 mg daily started 10/13/2013  Discontinued 10/20/2013 after taking it for only a week. This is not because of side effects but by her own personal preference.  MRI C-Spine 02/09/2015: Multiple osseous metastases are present. Foci of enhancement at C7, T2, T6, T7, T9, and T10. Constipation: I instructed the patient to take over-the-counter stool softeners daily.  Anemia and thrombocytopenia:Blood work revealed hemoglobin of 10.5 and platelet count of 242 (improved from 114).  Severe Anxiety: marked improvement We had lengthy conversations regarding her preferences and choices.  Patient tells me that if she does a scan and it shows progression then she would be compelled to take a treatment.  She feels that it is loss of control that she is afraid of. She does not have any friends or any support. She tells me that she has not been intimate with her husband.  This is also playing on her mind.  Because of irritable bowel syndrome she is also limited by the activities that she can do. Patient decided not to do any MRI evaluation of the spine. Return to clinic in 1 year for follow-up   I spent 25 minutes talking to the patient of which more than half was spent in counseling and coordination of care.  Orders Placed  This Encounter  Procedures  . CBC with Differential    Standing Status:   Future    Number of Occurrences:   1    Standing Expiration Date:   03/05/2018  . Comprehensive metabolic panel    Standing Status:   Future    Number of Occurrences:   1    Standing Expiration Date:   03/05/2018   The patient has a good understanding of the overall plan. she agrees with it. she will call with any problems that may develop before the next visit here.   Rulon Eisenmenger, MD 03/05/17

## 2017-03-05 NOTE — Assessment & Plan Note (Signed)
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  Patient had second opinions at [facility], [facility] and at [facility] and finally decided to get treated here  Prior treatment: Anastrozole 1 mg daily started 10/13/2013 Discontinued 10/20/2013 after taking it for only a week. This is not because of side effects but by her own personal preference.  MRI C-Spine 02/09/2015: Multiple osseous metastases are present. Foci of enhancement at C7, T2, T6, T7, T9, and T10. Constipation: I instructed the patient to take over-the-counter stool softeners daily.  Anemia and thrombocytopenia:Blood work revealed hemoglobin of 12 and platelet count of 176 (improved from 114).   Severe Anxiety: marked improvement  Patient decided not to do any MRI evaluation of the spine. Return to clinic in 1 year for follow-up

## 2017-04-01 ENCOUNTER — Encounter: Payer: Self-pay | Admitting: Hematology and Oncology

## 2017-04-03 ENCOUNTER — Other Ambulatory Visit: Payer: Self-pay | Admitting: *Deleted

## 2017-04-03 DIAGNOSIS — Z8719 Personal history of other diseases of the digestive system: Secondary | ICD-10-CM

## 2017-04-03 DIAGNOSIS — R194 Change in bowel habit: Secondary | ICD-10-CM

## 2017-04-03 DIAGNOSIS — R109 Unspecified abdominal pain: Secondary | ICD-10-CM

## 2017-04-03 DIAGNOSIS — R110 Nausea: Secondary | ICD-10-CM

## 2017-04-19 ENCOUNTER — Other Ambulatory Visit: Payer: Self-pay

## 2017-04-19 ENCOUNTER — Telehealth: Payer: Self-pay

## 2017-04-19 ENCOUNTER — Encounter: Payer: Self-pay | Admitting: Gastroenterology

## 2017-04-19 DIAGNOSIS — C50412 Malignant neoplasm of upper-outer quadrant of left female breast: Secondary | ICD-10-CM

## 2017-04-19 DIAGNOSIS — Z170 Estrogen receptor positive status [ER+]: Principal | ICD-10-CM

## 2017-04-19 NOTE — Telephone Encounter (Signed)
Returned pt call regarding needing a new gastro referral. I sent one integrally to [place], with Dr. Havery Moros. Informed pt she should be expecting a call from their office. No further questions.  Cyndia Bent RN

## 2017-04-19 NOTE — Progress Notes (Unsigned)
reg

## 2017-04-29 ENCOUNTER — Encounter: Payer: Self-pay | Admitting: Hematology and Oncology

## 2017-04-29 DIAGNOSIS — C50412 Malignant neoplasm of upper-outer quadrant of left female breast: Secondary | ICD-10-CM

## 2017-05-02 ENCOUNTER — Ambulatory Visit: Payer: BLUE CROSS/BLUE SHIELD | Admitting: Gastroenterology

## 2017-05-02 ENCOUNTER — Encounter: Payer: Self-pay | Admitting: Gastroenterology

## 2017-05-02 VITALS — BP 110/68 | HR 92 | Ht 68.0 in | Wt 124.1 lb

## 2017-05-02 DIAGNOSIS — Z1211 Encounter for screening for malignant neoplasm of colon: Secondary | ICD-10-CM | POA: Diagnosis not present

## 2017-05-02 DIAGNOSIS — R150 Incomplete defecation: Secondary | ICD-10-CM

## 2017-05-02 MED ORDER — PEG-KCL-NACL-NASULF-NA ASC-C 140 G PO SOLR: 1.0000 | ORAL | 0 refills | Status: DC

## 2017-05-02 NOTE — Progress Notes (Signed)
05/02/2017 Cynthia Rosario [id number] May 29, 1955   HISTORY OF PRESENT ILLNESS: This is a pleasant 62 year old female who is new to our office.  She was referred here by her oncologist, Dr. Lindi Adie, in order to discuss issues with her bowels and possibly schedule colonoscopy.  The patient has history of breast cancer that was initially diagnosed 16 years ago.  She had a recurrence in 2012.  At some point she underwent a reconstruction with a TRAM flap.  Then in 2015 she had abnormal uterine bleeding and was found to have cancer cells so a total hysterectomy was performed.  She comes here today with complaints of not being able to completely empty her bowels and frequently having to visit the bathroom because of that.  Says that she sometimes visits the bathroom 20 times and just passes small amounts of stool.  She has some periumbilical and lower abdominal discomfort on and off.  She actually is scheduled to have pelvic floor physical therapy starting next week.  She has been reluctant to have a colonoscopy or any type of imaging as she does not want anymore bad news about cancers.  Dr. Lindi Adie has been wanting her to undergo CT scan for surveillance, but she had declined this in the past.   Past Medical History:  Diagnosis Date  . Abnormal pap   . Anemia   . Breast cancer ([place])   . Cervical polyp   . Chronic headaches   . Dermoid   . Fibroid   . Kidney stones   . Osteopenia   . Ovarian cyst   . Paresthesia 04/07/2015  . Urinary urgency    Past Surgical History:  Procedure Laterality Date  . ABDOMINAL HYSTERECTOMY  09/18/13   robotic hyst, bso for metastatic breast cancer, Dr Alycia Rossetti at [facility].  Marland Kitchen BREAST ENHANCEMENT SURGERY    . BREAST LUMPECTOMY     x2  . MASTECTOMY Bilateral   . RECONSTRUCTION BREAST W/ TRAM FLAP    . WISDOM TOOTH EXTRACTION      reports that she quit smoking about 36 years ago. she has never used smokeless tobacco. She reports that she does not drink alcohol or use  drugs. family history includes Heart disease in her father; Hypertension in her mother; Stomach cancer in her paternal grandmother. No Known Allergies    Outpatient Encounter Medications as of 05/02/2017  Medication Sig  . Bismuth Subsalicylate (PEPTO-BISMOL PO) Take by mouth as needed.  . Calcium & Magnesium Carbonates (MYLANTA PO) Take by mouth as needed.  Marland Kitchen ibuprofen (ADVIL,MOTRIN) 200 MG tablet Take 200 mg by mouth as needed.    No facility-administered encounter medications on file as of 05/02/2017.      REVIEW OF SYSTEMS  : All other systems reviewed and negative except where noted in the History of Present Illness.   PHYSICAL EXAM: BP 110/68 (BP Location: Left Arm, Patient Position: Sitting, Cuff Size: Normal)   Pulse 92   Ht 5\' 8"  (1.727 m) Comment: height measured without shoes  Wt 124 lb 2 oz (56.3 kg)   BMI 18.87 kg/m  General: Well developed white female in no acute distress Head: Normocephalic and atraumatic Eyes:  Sclerae anicteric, conjunctiva pink. Ears: Normal auditory acuity Lungs: Clear throughout to auscultation; no increased WOB. Heart: Regular rate and rhythm; no M/R/G. Abdomen: Soft, non-distended.  BS present.  Non-tender.  Low transverse incisional scar noted from TRAM.  Palpated what feels like a suture in that area.  Also some fullness, protrusion  on the right pelvis/groin that may be a hernia related to the surgery as well. Rectal:  Will be done at the time of colonoscopy. Musculoskeletal: Symmetrical with no gross deformities  Skin: No lesions on visible extremities Extremities: No edema  Neurological: Alert oriented x 4, grossly non-focal Psychological:  Alert and cooperative. Normal mood and affect  ASSESSMENT AND PLAN: *Screening colonoscopy:  Never had one in the past.  Will schedule with Dr. Havery Moros. *Incomplete evacuation of stools:  Constantly just passing small amounts of stool.  She is actually scheduled to go to pelvic floor physical  therapy next week.  We talked a lot about how things can shift and change after complete hysterectomy.  She also had a TRAM breast reconstruction surgery so that will affect her abdominal wall musculature as well.  I have asked her to begin taking MiraLAX daily and a daily powder fiber supplement such as Benefiber or Citrucel.  **The risks, benefits, and alternatives to colonoscopy were discussed with the patient and she consents to proceed.    CC:  [name], MD  CC:  Dr. Lindi Adie

## 2017-05-02 NOTE — Patient Instructions (Addendum)
Start Miralax once daily as directed.   Start a daily powder fiber Such as Benefiber or Citrucel.   You have been scheduled for a colonoscopy. Please follow written instructions given to you at your visit today.  Please pick up your prep supplies at the pharmacy within the next 1-3 days. If you use inhalers (even only as needed), please bring them with you on the day of your procedure. Your physician has requested that you go to www.startemmi.com and enter the access code given to you at your visit today. This web site gives a general overview about your procedure. However, you should still follow specific instructions given to you by our office regarding your preparation for the procedure.

## 2017-05-03 NOTE — Progress Notes (Signed)
Agree with assessment and plan as outlined.  

## 2017-05-06 ENCOUNTER — Encounter: Payer: Self-pay | Admitting: Physical Therapy

## 2017-05-06 ENCOUNTER — Ambulatory Visit: Payer: BLUE CROSS/BLUE SHIELD | Attending: Hematology and Oncology | Admitting: Physical Therapy

## 2017-05-06 ENCOUNTER — Other Ambulatory Visit: Payer: Self-pay

## 2017-05-06 DIAGNOSIS — M6281 Muscle weakness (generalized): Secondary | ICD-10-CM | POA: Diagnosis present

## 2017-05-06 DIAGNOSIS — M62838 Other muscle spasm: Secondary | ICD-10-CM | POA: Diagnosis not present

## 2017-05-06 DIAGNOSIS — R279 Unspecified lack of coordination: Secondary | ICD-10-CM | POA: Diagnosis present

## 2017-05-06 NOTE — Patient Instructions (Signed)
Relaxation Exercises with the Urge to Void   When you experience an urge to void:  FIRST  Stop and stand very still    Sit down if you can    Don't move    You need to stay very still to maintain control  SECOND Squeeze your pelvic floor muscles 5 times, like a quick flick, to keep from leaking  THIRD Relax  Take a deep breath and then let it out  Try to make the urge go away by using relaxation and visualization techniques  FINALLY When you feel the urge go away somewhat, walk normally to the bathroom.   If the urge gets suddenly stronger on the way, you may stop again and relax to regain control.   About Abdominal Massage  Abdominal massage, also called external colon massage, is a self-treatment circular massage technique that can reduce and eliminate gas and ease constipation. The colon naturally contracts in waves in a clockwise direction starting from inside the right hip, moving up toward the ribs, across the belly, and down inside the left hip.  When you perform circular abdominal massage, you help stimulate your colon's normal wave pattern of movement called peristalsis.  It is most beneficial when done after eating.  Positioning You can practice abdominal massage with oil while lying down, or in the shower with soap.  Some people find that it is just as effective to do the massage through clothing while sitting or standing.  How to Massage Start by placing your finger tips or knuckles on your right side, just inside your hip bone.  . Make small circular movements while you move upward toward your rib cage.   . Once you reach the bottom right side of your rib cage, take your circular movements across to the left side of the bottom of your rib cage.  . Next, move downward until you reach the inside of your left hip bone.  This is the path your feces travel in your colon. . Continue to perform your abdominal massage in this pattern for 10 minutes each day.     You can  apply as much pressure as is comfortable in your massage.  Start gently and build pressure as you continue to practice.  Notice any areas of pain as you massage; areas of slight pain may be relieved as you massage, but if you have areas of significant or intense pain, consult with your healthcare provider.  Other Considerations . General physical activity including bending and stretching can have a beneficial massage-like effect on the colon.  Deep breathing can also stimulate the colon because breathing deeply activates the same nervous system that supplies the colon.   . Abdominal massage should always be used in combination with a bowel-conscious diet that is high in the proper type of fiber for you, fluids (primarily water), and a regular exercise program.  [facility] [address], [address] [place], [place] [address] Phone # [phone] Fax [phone]  

## 2017-05-06 NOTE — Therapy (Signed)
[facility] [address], [place], [place], [address] Phone: [phone]   Fax:  [phone]  Physical Therapy Evaluation  Patient Details  Name: Cynthia Rosario MRN: [id number] Date of Birth: [birth year] Referring Provider: [name], MD   Encounter Date: 05/06/2017  PT End of Session - 05/06/17 1505    Visit Number  1    Date for PT Re-Evaluation  07/29/17    PT Start Time  1430    PT Stop Time  1520    PT Time Calculation (min)  50 min    Activity Tolerance  Patient tolerated treatment well    Behavior During Therapy  [facility] for tasks assessed/performed       Past Medical History:  Diagnosis Date  . Abnormal pap   . Anemia   . Breast cancer ([place])   . Cervical polyp   . Chronic headaches   . Dermoid   . Fibroid   . Kidney stones   . Osteopenia   . Ovarian cyst   . Paresthesia 04/07/2015  . Urinary urgency     Past Surgical History:  Procedure Laterality Date  . ABDOMINAL HYSTERECTOMY  09/18/13   robotic hyst, bso for metastatic breast cancer, Dr Alycia Rossetti at [facility].  Marland Kitchen BREAST ENHANCEMENT SURGERY    . BREAST LUMPECTOMY     x2  . MASTECTOMY Bilateral   . RECONSTRUCTION BREAST W/ TRAM FLAP    . WISDOM TOOTH EXTRACTION      There were no vitals filed for this visit.   Subjective Assessment - 05/06/17 1433    Subjective  Pt reports to the clinic with metastatis breast CA estrogen positive and she had hysterectomy in 2015.  Pt reports she has had increasing urinary and fecal symptoms.  She is currently having severe urgency and urge incontinence.  Pt has bulge on lower right quadrant and repaired with a mesh repair.  April 2017 reports she started having incomplete BM and sometimes has to go 15-20x/day.  BM from quarter to pencil.  Pt eats a lot of fiber.      Pertinent History  TRAM flap surgery, total hysterectomy uterine CA 2015, history of breast CA estrogen receptor positive metastasized    Limitations   House hold activities;Other (comment) toileting    Patient Stated Goals  reduce constipation, reduce bladder irritation    Currently in Pain?  No/denies         [facility] PT Assessment - 05/06/17 0001      Assessment   Medical Diagnosis  C50.412 (ICD-10-CM) - Malignant neoplasm of upper-outer quadrant of left female breast, unspecified estrogen receptor status [facility])    Referring Provider  [name], MD    Onset Date/Surgical Date  -- April 2017 - bowel symptoms began    Prior Therapy  No      Precautions   Precautions  None      Restrictions   Weight Bearing Restrictions  No      Balance Screen   Has the patient fallen in the past 6 months  Yes    How many times?  1 slip on ice    Has the patient had a decrease in activity level because of a fear of falling?   No    Is the patient reluctant to leave their home because of a fear of falling?   No      Home Film/video editor residence    Living Arrangements  Spouse/significant other;Children      Prior Function   Level of Independence  Independent    Vocation  Unemployed    Leisure  takes care of horse      Cognition   Overall Cognitive Status  Within Functional Limits for tasks assessed      Posture/Postural Control   Posture/Postural Control  Postural limitations    Postural Limitations  Posterior pelvic tilt;Decreased lumbar lordosis      ROM / Strength   AROM / PROM / Strength  PROM;Strength;AROM      AROM   Overall AROM Comments  lumbar flexion and ext 50% limited      PROM   Overall PROM Comments  hip flexion and IR 25% limted      Strength   Overall Strength Comments  left hip abduction 4/5; right hip abduction 5/5      Flexibility   Soft Tissue Assessment /Muscle Length  yes    Hamstrings  50% limted bilaterally      Palpation   Palpation comment  fascial restrictions all over the abdomen             Objective measurements completed on examination: See above findings.     Pelvic Floor Special Questions - 05/06/17 0001    Prior Pelvic/Prostate Exam  Yes    Date of Last Pelvic/Prostate Exam  08/04/16    Prior Pregnancies  Yes    Number of Vaginal Deliveries  2 and one miscarriage    Any difficulty with labor and deliveries  No    Currently Sexually Active  No    Marinoff Scale  no problems    Urinary Leakage  Yes    How often  at least 4x/day a few other leaks; 2-3x/night    Pad use  1 overnight pad    Activities that cause leaking  With strong urge    Urinary urgency  Yes    Urinary frequency  9 x /day    Fecal incontinence  No up to 20x/day, normally less    Fluid intake  50 oz    Caffeine beverages  1 coffee    Falling out feeling (prolapse)  -- rash    Skin Integrity  Intact;Hemorroids;Erthema;Irritaion present at    Skin Integrity Irritation Present at  around anus    Pelvic Floor Internal Exam  pt was informed and consent given to perform internal soft tissue assessment    Exam Type  Rectal    Palpation  fascial restriction at levator    Strength  fair squeeze, definite lift    Strength # of reps  1    Strength # of seconds  3    Tone  high               PT Education - 05/06/17 1622    Education provided  Yes    Education Details  abdominal massage, urge to void, gave desert harvest samples of gele, glide, and relevium    Person(s) Educated  Patient    Methods  Explanation;Demonstration    Comprehension  Verbalized understanding;Returned demonstration       PT Short Term Goals - 05/06/17 1653      PT SHORT TERM GOAL #1   Title  independent with urge to void techniques for bladder retraining    Time  4    Period  Weeks    Status  New    Target Date  06/03/17      PT  SHORT TERM GOAL #2   Title  pt will report decreased nocturia to 2x/night at the most    Time  4    Period  Weeks    Status  New    Target Date  06/03/17      PT SHORT TERM GOAL #3   Title  pt will be able to push one finger out of rectum due to  improved muscle coordination    Time  4    Period  Weeks    Status  New    Target Date  06/03/17        PT Long Term Goals - 05/06/17 1656      PT LONG TERM GOAL #1   Title  pt will be independent with self massage or dilators for improved muscle flexibility and function    Time  12    Period  Weeks    Status  New    Target Date  07/29/17      PT LONG TERM GOAL #2   Title  Pt will be able to perform kegel and hold for 10 seconds for greater bladder control    Time  12    Period  Weeks    Status  New    Target Date  07/29/17      PT LONG TERM GOAL #3   Title  Pt will be able to completely empty when having BM due to improved muscle coordination as indicated by only 3 BM/day at the most.    Time  12    Period  Weeks    Status  New    Target Date  07/29/17      PT LONG TERM GOAL #4   Title  Pt will have bowel movemnt that is at least quarter in diameter due to improved soft tissue.    Time  12    Period  Weeks    Status  New    Target Date  07/29/17      PT LONG TERM GOAL #5   Title  Pt will report nocturia 1x or less per night due to improved bladder control    Time  12    Period  Weeks    Status  New    Target Date  07/29/17             Plan - 05/06/17 1636    Clinical Impression Statement  Pt presents to clinic with complicated history due to breast cancer and flap procedure done for breast reconstruction.  Pt has history of multiple abdominal procedures and radiation to breast tissue. Pt has recently been having issue with urge incontinence and difficulty having a BM . She demonstrates lack of coordination and contracts anal sphincter muscles when attempting to push a finger out of the rectum.  Pt has hamsting tightness, fascial restrictions throughout abdomen.  Pt has limited hip and lumbar ROM.  Pt has muscle spasms of internal and external anal sphincter muscles. Pt will benefit from skilled PT to address these impairments with skilled techniques listed  below for improved function and quality of life.    History and Personal Factors relevant to plan of care:  TRAM flap surgery, total hysterectomy uterine CA 2015, history of breast CA estrogen receptor positive metastasized    Clinical Presentation  Evolving    Clinical Presentation due to:  Pt's condition has been worsening    Clinical Decision Making  Moderate    Rehab Potential  Excellent  PT Frequency  1x / week    PT Duration  12 weeks    PT Treatment/Interventions  ADLs/Self Care Home Management;Biofeedback;Cryotherapy;English as a second language teacher;Therapeutic activities;Therapeutic exercise;Manual techniques;Patient/family education;Neuromuscular re-education;Passive range of motion;Dry needling;Taping    PT Next Visit Plan  abdominal massage, toilet techniques, STM vaginally or rectally, biofeedback    Consulted and Agree with Plan of Care  Patient       Patient will benefit from skilled therapeutic intervention in order to improve the following deficits and impairments:  Increased fascial restricitons, Pain, Decreased strength, Decreased coordination, Increased muscle spasms  Visit Diagnosis: Other muscle spasm  Muscle weakness (generalized)  Unspecified lack of coordination     Problem List Patient Active Problem List   Diagnosis Date Noted  . Special screening for malignant neoplasms, colon 05/02/2017  . Incomplete passage of stool 05/02/2017  . Paresthesia 04/07/2015  . Breast cancer of upper-outer quadrant of left female breast ([place])   . Ovarian cyst   . Osteopenia   . Cervical polyp   . Fibroid   . Kidney stones     Zannie Cove, PT 05/06/2017, 5:02 PM  [facility] [address] [place], [place], [address] Phone: [phone]   Fax:  [phone]  Name: Shaquanta Harkless MRN: [id number] Date of Birth: 30-Nov-1955

## 2017-05-07 ENCOUNTER — Ambulatory Visit: Payer: BLUE CROSS/BLUE SHIELD | Admitting: Physical Therapy

## 2017-05-13 ENCOUNTER — Ambulatory Visit: Payer: BLUE CROSS/BLUE SHIELD | Admitting: Physical Therapy

## 2017-05-13 ENCOUNTER — Encounter: Payer: Self-pay | Admitting: Physical Therapy

## 2017-05-13 DIAGNOSIS — M62838 Other muscle spasm: Secondary | ICD-10-CM

## 2017-05-13 DIAGNOSIS — R279 Unspecified lack of coordination: Secondary | ICD-10-CM

## 2017-05-13 DIAGNOSIS — M6281 Muscle weakness (generalized): Secondary | ICD-10-CM

## 2017-05-13 NOTE — Patient Instructions (Signed)
Curable App:  It has some free info and then you pay for it if you want more    Position yourself as shown grabbing onto feet or behind the knees. You should feel a gentle stretch. Breathe in and allow the pelvic floor muscles to relax. Hold 1 min. 2 times per day.  Adductors, Frog Squat    Crouch with elbows inside knees . Gently push knees outward. Hold _30__ seconds.1 Repeat __1_ times per session. Do _2__ sessions per day.  Copyright  VHI. All rights reserved.   BACK: Child's Pose (Sciatica)    Sit in knee-chest position and reach arms forward. Separate knees for comfort. Hold position for _60__ breaths. Repeat _2__ times. Do _2__ times per day.  Copyright  VHI. All rights reserved.   Piriformis Stretch    Lying on back, pull right knee toward opposite shoulder. Hold _30___ seconds. Repeat __2__ times. Do _1___ sessions per day.  http://gt2.exer.us/258   Copyright  VHI. All rights reserved.   Piriformis Stretch, Supine    Lie supine, one ankle crossed onto opposite knee. Holding bottom leg behind knee, gently pull legs toward chest until stretch is felt in buttock of top leg. Hold _30__ seconds. For deeper stretch gently push top knee away from body.  Repeat _2__ times per session. Do _2__ sessions per day.  Copyright  VHI. All rights reserved.    Supine Knee-to-Chest, Unilateral    Lie on back, hands clasped behind one knee. Pull knee in toward chest until a comfortable stretch is felt in lower back and buttocks. Hold 30___ seconds.  Repeat __2_ times per session. Do _1__ sessions per day.  Copyright  VHI. All rights reserved.  Supine With Rotation    Lie on back with one knee drawn toward chest. Slowly bring bent leg across body until stretch is felt in lower back area. Hold _30__ seconds. Repeat to other side. Repeat _2__ times per session. Do __2_ sessions per day.  Copyright  VHI. All rights reserved.  Butterfly, Supine    Lie on back,  feet together. Lower knees toward floor. Hold 60___ seconds. Repeat _2__ times per session. Do _1__ sessions per day.  Copyright  VHI. All rights reserved.

## 2017-05-13 NOTE — Therapy (Signed)
[facility] [address], [place], [place], [address] Phone: [phone]   Fax:  [phone]  Physical Therapy Treatment  Patient Details  Name: Cynthia Rosario MRN: [id number] Date of Birth: [birth year] Referring Provider: [name], MD   Encounter Date: 05/13/2017  PT End of Session - 05/13/17 1448    Visit Number  2    Date for PT Re-Evaluation  07/29/17    PT Start Time  1448    PT Stop Time  1535    PT Time Calculation (min)  47 min    Activity Tolerance  Patient tolerated treatment well    Behavior During Therapy  [facility] for tasks assessed/performed       Past Medical History:  Diagnosis Date  . Abnormal pap   . Anemia   . Breast cancer ([place])   . Cervical polyp   . Chronic headaches   . Dermoid   . Fibroid   . Kidney stones   . Osteopenia   . Ovarian cyst   . Paresthesia 04/07/2015  . Urinary urgency     Past Surgical History:  Procedure Laterality Date  . ABDOMINAL HYSTERECTOMY  09/18/13   robotic hyst, bso for metastatic breast cancer, Dr Alycia Rossetti at [facility].  Marland Kitchen BREAST ENHANCEMENT SURGERY    . BREAST LUMPECTOMY     x2  . MASTECTOMY Bilateral   . RECONSTRUCTION BREAST W/ TRAM FLAP    . WISDOM TOOTH EXTRACTION      There were no vitals filed for this visit.  Subjective Assessment - 05/13/17 1545    Subjective  Pt states the urge to void has helped.  She reports she was able to get through the night without waking which hasn't happened for a couple.  Pt states her anus feels a little sore from have large BM today.    Pertinent History  TRAM flap surgery, total hysterectomy uterine CA 2015, history of breast CA estrogen receptor positive metastasized    Limitations  House hold activities;Other (comment)    Patient Stated Goals  reduce constipation, reduce bladder irritation    Currently in Pain?  No/denies                      [facility] Adult PT Treatment/Exercise - 05/13/17 0001       Neuro Re-ed    Neuro Re-ed Details   balloon breathing with ribcage excersion      Manual Therapy   Manual Therapy  Soft tissue mobilization;Myofascial release    Soft tissue mobilization  bilateral glutes    Myofascial Release  abdomen, diaphragm, lumbar paraspinals             PT Education - 05/13/17 1543    Education provided  Yes    Education Details  pelvic floor stretches, curable app    Person(s) Educated  Patient    Methods  Explanation;Handout;Verbal cues    Comprehension  Verbalized understanding       PT Short Term Goals - 05/13/17 1555      PT SHORT TERM GOAL #1   Title  independent with urge to void techniques for bladder retraining    Time  4    Period  Weeks    Status  Achieved      PT SHORT TERM GOAL #2   Title  pt will report decreased nocturia to 2x/night at the most    Baseline  had some nights with no nocturia, not consistent yet  Time  4    Period  Weeks    Status  On-going      PT SHORT TERM GOAL #3   Title  pt will be able to push one finger out of rectum due to improved muscle coordination    Time  4    Period  Weeks    Status  On-going        PT Long Term Goals - 05/06/17 1656      PT LONG TERM GOAL #1   Title  pt will be independent with self massage or dilators for improved muscle flexibility and function    Time  12    Period  Weeks    Status  New    Target Date  07/29/17      PT LONG TERM GOAL #2   Title  Pt will be able to perform kegel and hold for 10 seconds for greater bladder control    Time  12    Period  Weeks    Status  New    Target Date  07/29/17      PT LONG TERM GOAL #3   Title  Pt will be able to completely empty when having BM due to improved muscle coordination as indicated by only 3 BM/day at the most.    Time  12    Period  Weeks    Status  New    Target Date  07/29/17      PT LONG TERM GOAL #4   Title  Pt will have bowel movemnt that is at least quarter in diameter due to improved soft tissue.     Time  12    Period  Weeks    Status  New    Target Date  07/29/17      PT LONG TERM GOAL #5   Title  Pt will report nocturia 1x or less per night due to improved bladder control    Time  12    Period  Weeks    Status  New    Target Date  07/29/17            Plan - 05/13/17 1448    Clinical Impression Statement  Patient has made progress with the urge to void techniques and is having less frequency. She has fascial adhesions throughout abdomen and low back and had good release with manual techniques today.  Pt reported feeling movement of large intestine after treatment today.  Pt will benefit from skilled PT for improved ability to relax and coordination pelvic floor muscles for improved self care activities.    Rehab Potential  Excellent    PT Treatment/Interventions  ADLs/Self Care Home Management;Biofeedback;Cryotherapy;English as a second language teacher;Therapeutic activities;Therapeutic exercise;Manual techniques;Patient/family education;Neuromuscular re-education;Passive range of motion;Dry needling;Taping    PT Next Visit Plan  toilet techniques, stretches, biofeedback anal sphincters    Consulted and Agree with Plan of Care  Patient       Patient will benefit from skilled therapeutic intervention in order to improve the following deficits and impairments:  Increased fascial restricitons, Pain, Decreased strength, Decreased coordination, Increased muscle spasms  Visit Diagnosis: Other muscle spasm  Muscle weakness (generalized)  Unspecified lack of coordination     Problem List Patient Active Problem List   Diagnosis Date Noted  . Special screening for malignant neoplasms, colon 05/02/2017  . Incomplete passage of stool 05/02/2017  . Paresthesia 04/07/2015  . Breast cancer of upper-outer quadrant of left female breast ([place])   .  Ovarian cyst   . Osteopenia   . Cervical polyp   . Fibroid   . Kidney stones     Zannie Cove, PT 05/13/2017, 3:57 PM  [facility] [address] [place], [place], [address] Phone: [phone]   Fax:  [phone]  Name: Cynthia Rosario MRN: [id number] Date of Birth: 1955/06/24

## 2017-05-20 ENCOUNTER — Ambulatory Visit: Payer: BLUE CROSS/BLUE SHIELD | Admitting: Physical Therapy

## 2017-05-20 DIAGNOSIS — R279 Unspecified lack of coordination: Secondary | ICD-10-CM

## 2017-05-20 DIAGNOSIS — M62838 Other muscle spasm: Secondary | ICD-10-CM | POA: Diagnosis not present

## 2017-05-20 DIAGNOSIS — M6281 Muscle weakness (generalized): Secondary | ICD-10-CM

## 2017-05-20 NOTE — Therapy (Signed)
[facility] [address], [place], [place], [address] Phone: [phone]   Fax:  [phone]  Physical Therapy Treatment  Patient Details  Name: Cynthia Rosario MRN: [id number] Date of Birth: 1955/10/18 Referring Provider: [name], MD   Encounter Date: 05/20/2017  PT End of Session - 05/20/17 1454    Visit Number  3    Date for PT Re-Evaluation  07/29/17    PT Start Time  1450    PT Stop Time  1535    PT Time Calculation (min)  45 min    Activity Tolerance  Patient tolerated treatment well    Behavior During Therapy  [facility] for tasks assessed/performed       Past Medical History:  Diagnosis Date  . Abnormal pap   . Anemia   . Breast cancer ([place])   . Cervical polyp   . Chronic headaches   . Dermoid   . Fibroid   . Kidney stones   . Osteopenia   . Ovarian cyst   . Paresthesia 04/07/2015  . Urinary urgency     Past Surgical History:  Procedure Laterality Date  . ABDOMINAL HYSTERECTOMY  09/18/13   robotic hyst, bso for metastatic breast cancer, Dr Alycia Rossetti at [facility].  Marland Kitchen BREAST ENHANCEMENT SURGERY    . BREAST LUMPECTOMY     x2  . MASTECTOMY Bilateral   . RECONSTRUCTION BREAST W/ TRAM FLAP    . WISDOM TOOTH EXTRACTION      There were no vitals filed for this visit.  Subjective Assessment - 05/20/17 1611    Subjective  Pt states the bowel movements have been a little better, maybe nickel diameter  States she is still having to go several times to empty but feels like she is getting more empty after several trips to the bathroom.  States the breathing is making her sore.    Pertinent History  TRAM flap surgery, total hysterectomy uterine CA 2015, history of breast CA estrogen receptor positive metastasized    Limitations  House hold activities;Other (comment)    Patient Stated Goals  reduce constipation, reduce bladder irritation    Currently in Pain?  No/denies                      [facility]  Adult PT Treatment/Exercise - 05/20/17 0001      Neuro Re-ed    Neuro Re-ed Details   breathing with contract and relax of pelvic floor, verbal and tactile cues to relax and bulge pelvic floor with breathing      Manual Therapy   Manual Therapy  Soft tissue mobilization;Internal Pelvic Floor    Manual therapy comments  pt informed and consent given to perofrm internal soft tissue work    Soft tissue mobilization  glutes, lumbar paraspinals    Internal Pelvic Floor  rectal, puborectalis, coccygeus, lavator ani               PT Short Term Goals - 05/20/17 1609      PT SHORT TERM GOAL #1   Title  independent with urge to void techniques for bladder retraining    Time  4    Period  Weeks    Status  Achieved      PT SHORT TERM GOAL #2   Title  pt will report decreased nocturia to 2x/night at the most    Baseline  had some nights with no nocturia, not consistent yet    Time  4    Period  Weeks    Status  On-going      PT SHORT TERM GOAL #3   Title  pt will be able to push one finger out of rectum due to improved muscle coordination    Baseline  unable, able to relax with breathing techniques    Time  4    Period  Weeks    Status  On-going        PT Long Term Goals - 05/06/17 1656      PT LONG TERM GOAL #1   Title  pt will be independent with self massage or dilators for improved muscle flexibility and function    Time  12    Period  Weeks    Status  New    Target Date  07/29/17      PT LONG TERM GOAL #2   Title  Pt will be able to perform kegel and hold for 10 seconds for greater bladder control    Time  12    Period  Weeks    Status  New    Target Date  07/29/17      PT LONG TERM GOAL #3   Title  Pt will be able to completely empty when having BM due to improved muscle coordination as indicated by only 3 BM/day at the most.    Time  12    Period  Weeks    Status  New    Target Date  07/29/17      PT LONG TERM GOAL #4   Title  Pt will have bowel movemnt  that is at least quarter in diameter due to improved soft tissue.    Time  12    Period  Weeks    Status  New    Target Date  07/29/17      PT LONG TERM GOAL #5   Title  Pt will report nocturia 1x or less per night due to improved bladder control    Time  12    Period  Weeks    Status  New    Target Date  07/29/17            Plan - 05/20/17 1548    Clinical Impression Statement  Pt was able to relax with breathing, but still unable to bulge pelvic floor.  Pt has tight puborectalis and coccygeus and levator ani.  She had some release of trigger points throughout with manual techniques.  Pt was unable to evacuate finger from rectum but had improved ability to keep muscles relaxed with verbal and tactile cues.  Pt continues to need skilled PT to work on muscle coordination for improved toileting.    PT Treatment/Interventions  ADLs/Self Care Home Management;Biofeedback;Cryotherapy;English as a second language teacher;Therapeutic activities;Therapeutic exercise;Manual techniques;Patient/family education;Neuromuscular re-education;Passive range of motion;Dry needling;Taping    PT Next Visit Plan  biofeedback, review toilet techniques, stretches adding hip IR/ER hamstring stretch    Consulted and Agree with Plan of Care  Patient       Patient will benefit from skilled therapeutic intervention in order to improve the following deficits and impairments:  Increased fascial restricitons, Pain, Decreased strength, Decreased coordination, Increased muscle spasms  Visit Diagnosis: Other muscle spasm  Muscle weakness (generalized)  Unspecified lack of coordination     Problem List Patient Active Problem List   Diagnosis Date Noted  . Special screening for malignant neoplasms, colon 05/02/2017  . Incomplete passage of stool 05/02/2017  . Paresthesia  04/07/2015  . Breast cancer of upper-outer quadrant of left female breast ([place])   . Ovarian cyst    . Osteopenia   . Cervical polyp   . Fibroid   . Kidney stones     Zannie Cove, PT 05/20/2017, 4:43 PM  [facility] [address], [place], [place], [address] Phone: [phone]   Fax:  [phone]  Name: Jenelle Drennon MRN: [id number] Date of Birth: October 10, 1955

## 2017-05-27 ENCOUNTER — Encounter: Payer: BLUE CROSS/BLUE SHIELD | Admitting: Physical Therapy

## 2017-06-03 ENCOUNTER — Encounter: Payer: BLUE CROSS/BLUE SHIELD | Admitting: Physical Therapy

## 2017-06-10 ENCOUNTER — Encounter: Payer: Self-pay | Admitting: Physical Therapy

## 2017-06-10 ENCOUNTER — Ambulatory Visit: Payer: BLUE CROSS/BLUE SHIELD | Attending: Hematology and Oncology | Admitting: Physical Therapy

## 2017-06-10 DIAGNOSIS — M62838 Other muscle spasm: Secondary | ICD-10-CM | POA: Diagnosis present

## 2017-06-10 DIAGNOSIS — M6281 Muscle weakness (generalized): Secondary | ICD-10-CM | POA: Insufficient documentation

## 2017-06-10 DIAGNOSIS — R279 Unspecified lack of coordination: Secondary | ICD-10-CM | POA: Diagnosis present

## 2017-06-10 NOTE — Therapy (Signed)
[facility] [address], [place], [place], [address] Phone: [phone]   Fax:  [phone]  Physical Therapy Treatment  Patient Details  Name: Cynthia Rosario MRN: [id number] Date of Birth: 10-21-1955 Referring Provider: [name], MD   Encounter Date: 06/10/2017  PT End of Session - 06/10/17 1652    Visit Number  4    Date for PT Re-Evaluation  07/29/17    PT Start Time  1448    PT Stop Time  1535    PT Time Calculation (min)  47 min    Activity Tolerance  Patient tolerated treatment well    Behavior During Therapy  [facility] for tasks assessed/performed       Past Medical History:  Diagnosis Date  . Abnormal pap   . Anemia   . Breast cancer ([place])   . Cervical polyp   . Chronic headaches   . Dermoid   . Fibroid   . Kidney stones   . Osteopenia   . Ovarian cyst   . Paresthesia 04/07/2015  . Urinary urgency     Past Surgical History:  Procedure Laterality Date  . ABDOMINAL HYSTERECTOMY  09/18/13   robotic hyst, bso for metastatic breast cancer, Dr Alycia Rossetti at [facility].  Marland Kitchen BREAST ENHANCEMENT SURGERY    . BREAST LUMPECTOMY     x2  . MASTECTOMY Bilateral   . RECONSTRUCTION BREAST W/ TRAM FLAP    . WISDOM TOOTH EXTRACTION      There were no vitals filed for this visit.  Subjective Assessment - 06/10/17 1453    Subjective  Pt was constipated when traveling.  She states that she has been able to feel like she is able to push the stool out a little easier using the toileting techniques.      Pertinent History  TRAM flap surgery, total hysterectomy uterine CA 2015, history of breast CA estrogen receptor positive metastasized    Limitations  House hold activities;Other (comment)    Patient Stated Goals  reduce constipation, reduce bladder irritation    Currently in Pain?  No/denies                   Pelvic Floor Special Questions - 06/10/17 0001    Biofeedback  10-47mV resting tone; contract and  hold 5 sec at 50% contraction x 10    Biofeedback sensor type  Surface    Biofeedback Activity  Bulging 5 sec hold        [facility] Adult PT Treatment/Exercise - 06/10/17 0001      Neuro Re-ed    Neuro Re-ed Details   breathing with stretches - butterfly, breathing in hooklying to decrease tone, piriformis stretch, double knee to chest,              PT Education - 06/10/17 1647    Education provided  Yes    Education Details  knees to chest, open book    Person(s) Educated  Patient    Methods  Explanation;Demonstration    Comprehension  Verbalized understanding;Returned demonstration       PT Short Term Goals - 05/20/17 1609      PT SHORT TERM GOAL #1   Title  independent with urge to void techniques for bladder retraining    Time  4    Period  Weeks    Status  Achieved      PT SHORT TERM GOAL #2   Title  pt will report decreased nocturia to 2x/night  at the most    Baseline  had some nights with no nocturia, not consistent yet    Time  4    Period  Weeks    Status  On-going      PT SHORT TERM GOAL #3   Title  pt will be able to push one finger out of rectum due to improved muscle coordination    Baseline  unable, able to relax with breathing techniques    Time  4    Period  Weeks    Status  On-going        PT Long Term Goals - 06/10/17 1512      PT LONG TERM GOAL #1   Title  pt will be independent with self massage or dilators for improved muscle flexibility and function    Time  12    Period  Weeks    Status  On-going      PT LONG TERM GOAL #2   Title  Pt will be able to perform kegel and hold for 10 seconds for greater bladder control    Time  12    Period  Weeks    Status  On-going      PT LONG TERM GOAL #3   Title  Pt will be able to completely empty when having BM due to improved muscle coordination as indicated by only 3 BM/day at the most.    Time  12    Period  Weeks    Status  On-going      PT LONG TERM GOAL #4   Title  Pt will have bowel  movemnt that is at least quarter in diameter due to improved soft tissue.    Time  12    Period  Weeks    Status  On-going      PT LONG TERM GOAL #5   Title  Pt will report nocturia 1x or less per night due to improved bladder control    Baseline  3x    Time  12    Period  Weeks    Status  On-going            Plan - 06/10/17 1654    Clinical Impression Statement  Pt demonstrates about 10-12 mV resting tone using biofeedback today.  Pt had difficulty holding strong contraction > than 2 sec.  She was able to work into a 5 sec hold at 50% contraction (about 22-54mV) after practice with the biofeedback . Pt was given stretches that helped reduce muscle tone as demonstrated during today's session.  Pt is making steady progress and will benefit from skilled PT to improve muscle coordination for self care activities and quality of life.    PT Treatment/Interventions  ADLs/Self Care Home Management;Biofeedback;Cryotherapy;English as a second language teacher;Therapeutic activities;Therapeutic exercise;Manual techniques;Patient/family education;Neuromuscular re-education;Passive range of motion;Dry needling;Taping    PT Next Visit Plan  biofeedback, review toilet techniques, stretches adding hip IR/ER hamstring stretch, thoracic and lumbar stretches    Consulted and Agree with Plan of Care  Patient       Patient will benefit from skilled therapeutic intervention in order to improve the following deficits and impairments:  Increased fascial restricitons, Pain, Decreased strength, Decreased coordination, Increased muscle spasms  Visit Diagnosis: Other muscle spasm  Muscle weakness (generalized)  Unspecified lack of coordination     Problem List Patient Active Problem List   Diagnosis Date Noted  . Special screening for malignant neoplasms, colon 05/02/2017  . Incomplete passage of  stool 05/02/2017  . Paresthesia 04/07/2015  . Breast cancer of  upper-outer quadrant of left female breast ([place])   . Ovarian cyst   . Osteopenia   . Cervical polyp   . Fibroid   . Kidney stones     Zannie Cove, PT 06/10/2017, 5:07 PM  [facility] [address], [place], [place], [address] Phone: [phone]   Fax:  [phone]  Name: Ayshia Gramlich MRN: [id number] Date of Birth: 12/30/1955

## 2017-06-10 NOTE — Patient Instructions (Signed)
   Open Book  Lying on your side, arms straight out in front of you. Keeping arms straight, move top arm away from bottom and try to reach back of hand to opposite side of mat while keeping lower half still (legs don't move).  Hold 10 sec repeat 5 x    DOUBLE KNEE TO CHEST STRETCH - DKTC  While Lying on your back,  hold your knees and gently pull them up towards your chest.  Hold 30 sec and breathe into the pelvic floor.  Repeat 3x    [facility] [address], [place], [place] [address] Phone # [phone] Fax [phone]

## 2017-06-11 ENCOUNTER — Encounter: Payer: BLUE CROSS/BLUE SHIELD | Admitting: Gastroenterology

## 2017-06-17 ENCOUNTER — Ambulatory Visit: Payer: BLUE CROSS/BLUE SHIELD | Admitting: Physical Therapy

## 2017-06-17 DIAGNOSIS — M6281 Muscle weakness (generalized): Secondary | ICD-10-CM

## 2017-06-17 DIAGNOSIS — M62838 Other muscle spasm: Secondary | ICD-10-CM

## 2017-06-17 DIAGNOSIS — R279 Unspecified lack of coordination: Secondary | ICD-10-CM

## 2017-06-17 NOTE — Therapy (Signed)
[facility] [address] [place], [place], [address] Phone: [phone]   Fax:  [phone]  Physical Therapy Treatment  Patient Details  Name: Cynthia Rosario MRN: [id number] Date of Birth: 12-23-1955 Referring Provider: [name], MD   Encounter Date: 06/17/2017  PT End of Session - 06/17/17 1521    Visit Number  5    Date for PT Re-Evaluation  07/29/17    PT Start Time  1446    PT Stop Time  1538    PT Time Calculation (min)  52 min    Activity Tolerance  Patient tolerated treatment well    Behavior During Therapy  [facility] for tasks assessed/performed       Past Medical History:  Diagnosis Date  . Abnormal pap   . Anemia   . Breast cancer ([place])   . Cervical polyp   . Chronic headaches   . Dermoid   . Fibroid   . Kidney stones   . Osteopenia   . Ovarian cyst   . Paresthesia 04/07/2015  . Urinary urgency     Past Surgical History:  Procedure Laterality Date  . ABDOMINAL HYSTERECTOMY  09/18/13   robotic hyst, bso for metastatic breast cancer, Dr Alycia Rossetti at [facility].  Marland Kitchen BREAST ENHANCEMENT SURGERY    . BREAST LUMPECTOMY     x2  . MASTECTOMY Bilateral   . RECONSTRUCTION BREAST W/ TRAM FLAP    . WISDOM TOOTH EXTRACTION      There were no vitals filed for this visit.  Subjective Assessment - 06/17/17 1600    Subjective  I feel like it is getting easier to manage and I am able to control things more now.    Pertinent History  TRAM flap surgery, total hysterectomy uterine CA 2015, history of breast CA estrogen receptor positive metastasized    Patient Stated Goals  reduce constipation, reduce bladder irritation    Currently in Pain?  No/denies                      [facility] Adult PT Treatment/Exercise - 06/17/17 0001      Neuro Re-ed    Neuro Re-ed Details   biofeedback with all exercises performed working on contract and hold and relax in between      Knee/Hip Exercises: Supine   Bridges  with Clamshell  Strengthening;Right;Left;2 sets;10 reps contract and hold 3 sec, red band    Other Supine Knee/Hip Exercises  ball squeeze - hold 5 sec, rest 5 sec      Knee/Hip Exercises: Sidelying   Clams  red band - 20x bilat with pelvic floor contraction      Pelvic Floor Biofeedback   Biofeedback Activity  -- 5 sec hold/5 sec releax; clam with contract             PT Education - 06/17/17 1538    Education provided  Yes    Education Details  bridge, ball squeeze, clam    Person(s) Educated  Patient    Methods  Explanation;Demonstration    Comprehension  Verbalized understanding;Returned demonstration       PT Short Term Goals - 06/17/17 1510      PT SHORT TERM GOAL #1   Title  independent with urge to void techniques for bladder retraining    Time  4    Period  Weeks    Status  Achieved      PT SHORT TERM GOAL #2   Title  pt will report decreased nocturia to 2x/night at the most    Baseline  1x unless I have caffiene    Time  4    Period  Weeks    Status  Achieved      PT SHORT TERM GOAL #3   Title  pt will be able to push one finger out of rectum due to improved muscle coordination    Baseline  bowel movements are more complete and dime to quarter size    Time  4    Period  Weeks    Status  Achieved        PT Long Term Goals - 06/17/17 1515      PT LONG TERM GOAL #1   Title  pt will be independent with self massage or dilators for improved muscle flexibility and function    Time  12    Period  Weeks      PT LONG TERM GOAL #2   Title  Pt will be able to perform kegel and hold for 10 seconds for greater bladder control    Time  12    Period  Weeks    Status  On-going      PT LONG TERM GOAL #3   Title  Pt will be able to completely empty when having BM due to improved muscle coordination as indicated by only 3 BM/day at the most.    Baseline  had four today and felt good after    Time  12    Period  Weeks    Status  On-going      PT LONG TERM  GOAL #4   Title  Pt will have bowel movemnt that is at least quarter in diameter due to improved soft tissue.    Baseline  able to have up to quarter diameter    Time  12    Period  Weeks    Status  On-going      PT LONG TERM GOAL #5   Title  Pt will report nocturia 1x or less per night due to improved bladder control    Time  12    Period  Weeks    Status  On-going            Plan - 06/17/17 1543    Clinical Impression Statement  Patient was able to get strong contraction and hold for up to 5 seconds today.  She demonstrated a normal resting tone and is making good progress on her goals.  Pt has been having less bowel movements and more complete bowel movements.  Pt will benefit from skilled PT to continue working on improved self care.    PT Treatment/Interventions  ADLs/Self Care Home Management;Biofeedback;Cryotherapy;English as a second language teacher;Therapeutic activities;Therapeutic exercise;Manual techniques;Patient/family education;Neuromuscular re-education;Passive range of motion;Dry needling;Taping    PT Next Visit Plan  continue, biofeedback, review toilet techniques, stretches adding hip IR/ER hamstring stretch, thoracic and lumbar stretches    Consulted and Agree with Plan of Care  Patient       Patient will benefit from skilled therapeutic intervention in order to improve the following deficits and impairments:  Increased fascial restricitons, Pain, Decreased strength, Decreased coordination, Increased muscle spasms  Visit Diagnosis: Other muscle spasm  Muscle weakness (generalized)  Unspecified lack of coordination     Problem List Patient Active Problem List   Diagnosis Date Noted  . Special screening for malignant neoplasms, colon 05/02/2017  . Incomplete passage of stool 05/02/2017  . Paresthesia  04/07/2015  . Breast cancer of upper-outer quadrant of left female breast ([name])   . Ovarian cyst   . Osteopenia    . Cervical polyp   . Fibroid   . Kidney stones     Zannie Cove, PT 06/17/2017, 5:15 PM  [facility] [address] [place], [place], [address] Phone: [phone]   Fax:  [phone]  Name: Arlissa Monteverde MRN: [id number] Date of Birth: 05-14-1955

## 2017-06-17 NOTE — Patient Instructions (Signed)
HIP ADDUCTION SQUEEZE - SUPINE   Place a rolled up towel, ball or pillow between your knees and press your knees together so that you squeeze the object while zipping up from the anus to the belly button. Hold 5 seconds and then release and repeat. Rest 5 seconds in between.  Do 10 reps    ELASTIC BAND - SIDELYING CLAM - CLAMSHELL     While lying on your side with your knees bent and an elastic band wrapped around your knees, draw up the top knee while keeping contact of your feet together as shown. Keep pelvic floor engaged. Hold 3 seconds and rest 3 seconds Do not let your pelvis roll back during the lifting movement. Do 20x each side    BRIDGING ELASTIC BAND ABDUCTION    While lying on your back, place an elastic band around your knees and pull your knees apart. Hold this and then tighten your lower abdominals, squeeze your anus and pelvic floor and raise your buttocks off the floor/bed as creating a "Bridge" with your body.  Repeat 20x  [facility] [address] [place], [place] [address] Phone # [phone] Fax [phone]

## 2017-06-19 ENCOUNTER — Encounter: Payer: BLUE CROSS/BLUE SHIELD | Admitting: Gastroenterology

## 2017-06-24 ENCOUNTER — Encounter: Payer: Self-pay | Admitting: Physical Therapy

## 2017-06-24 ENCOUNTER — Ambulatory Visit: Payer: BLUE CROSS/BLUE SHIELD | Attending: Hematology and Oncology | Admitting: Physical Therapy

## 2017-06-24 DIAGNOSIS — M6281 Muscle weakness (generalized): Secondary | ICD-10-CM | POA: Diagnosis present

## 2017-06-24 DIAGNOSIS — R279 Unspecified lack of coordination: Secondary | ICD-10-CM

## 2017-06-24 DIAGNOSIS — M62838 Other muscle spasm: Secondary | ICD-10-CM | POA: Diagnosis not present

## 2017-06-24 NOTE — Therapy (Signed)
[facility] [address], [place], [place], [address] Phone: [phone]   Fax:  [phone]  Physical Therapy Treatment  Patient Details  Name: Cynthia Rosario MRN: [id number] Date of Birth: [birth year] Referring Provider: [name], MD   Encounter Date: 06/24/2017  PT End of Session - 06/24/17 1445    Visit Number  6    Date for PT Re-Evaluation  07/29/17    PT Start Time  1445    PT Stop Time  1530    PT Time Calculation (min)  45 min    Activity Tolerance  Patient tolerated treatment well    Behavior During Therapy  [facility] for tasks assessed/performed       Past Medical History:  Diagnosis Date  . Abnormal pap   . Anemia   . Breast cancer ([place])   . Cervical polyp   . Chronic headaches   . Dermoid   . Fibroid   . Kidney stones   . Osteopenia   . Ovarian cyst   . Paresthesia 04/07/2015  . Urinary urgency     Past Surgical History:  Procedure Laterality Date  . ABDOMINAL HYSTERECTOMY  09/18/13   robotic hyst, bso for metastatic breast cancer, Dr Alycia Rossetti at [facility].  Marland Kitchen BREAST ENHANCEMENT SURGERY    . BREAST LUMPECTOMY     x2  . MASTECTOMY Bilateral   . RECONSTRUCTION BREAST W/ TRAM FLAP    . WISDOM TOOTH EXTRACTION      There were no vitals filed for this visit.  Subjective Assessment - 06/24/17 1448    Subjective  I am still feeling better, but had some constipation after a smoothy the other day.      Pertinent History  TRAM flap surgery, total hysterectomy uterine CA 2015, history of breast CA estrogen receptor positive metastasized    Limitations  House hold activities;Other (comment)    Patient Stated Goals  reduce constipation, reduce bladder irritation    Currently in Pain?  No/denies                      [facility] Adult PT Treatment/Exercise - 06/24/17 0001      Knee/Hip Exercises: Stretches   Active Hamstring Stretch  Both;3 reps;20 seconds    Hip Flexor Stretch  Both;3 reps;20  seconds      Knee/Hip Exercises: Aerobic   Nustep  L2 x 8 min      Knee/Hip Exercises: Standing   Hip Flexion  Stengthening;Both;10 reps;Knee bent    Hip Abduction  Stengthening;Both;10 reps       contracting core and pelvic floor with verbal cues throughout exercise      PT Education - 06/24/17 1608    Education provided  Yes    Education Details  hip flexor and hamstring stretch    Person(s) Educated  Patient    Methods  Explanation;Demonstration    Comprehension  Verbalized understanding;Returned demonstration       PT Short Term Goals - 06/17/17 1510      PT SHORT TERM GOAL #1   Title  independent with urge to void techniques for bladder retraining    Time  4    Period  Weeks    Status  Achieved      PT SHORT TERM GOAL #2   Title  pt will report decreased nocturia to 2x/night at the most    Baseline  1x unless I have caffiene    Time  4  Period  Weeks    Status  Achieved      PT SHORT TERM GOAL #3   Title  pt will be able to push one finger out of rectum due to improved muscle coordination    Baseline  bowel movements are more complete and dime to quarter size    Time  4    Period  Weeks    Status  Achieved        PT Long Term Goals - 06/17/17 1515      PT LONG TERM GOAL #1   Title  pt will be independent with self massage or dilators for improved muscle flexibility and function    Time  12    Period  Weeks      PT LONG TERM GOAL #2   Title  Pt will be able to perform kegel and hold for 10 seconds for greater bladder control    Time  12    Period  Weeks    Status  On-going      PT LONG TERM GOAL #3   Title  Pt will be able to completely empty when having BM due to improved muscle coordination as indicated by only 3 BM/day at the most.    Baseline  had four today and felt good after    Time  12    Period  Weeks    Status  On-going      PT LONG TERM GOAL #4   Title  Pt will have bowel movemnt that is at least quarter in diameter due to  improved soft tissue.    Baseline  able to have up to quarter diameter    Time  12    Period  Weeks    Status  On-going      PT LONG TERM GOAL #5   Title  Pt will report nocturia 1x or less per night due to improved bladder control    Time  12    Period  Weeks    Status  On-going            Plan - 06/24/17 1611    Clinical Impression Statement  Patient has a a lot of tightness along back of Rt/Lt LE and felt stretches with not a lot of movement.  Pt was cued during exericses to engage core and pelvic floor.  Patient continues to verbally report progress and is able to feel her muscles engage during functional movement due to increased body awareness.  Pt will benefit from skilled PT to progress core strength.    PT Treatment/Interventions  ADLs/Self Care Home Management;Biofeedback;Cryotherapy;English as a second language teacher;Therapeutic activities;Therapeutic exercise;Manual techniques;Patient/family education;Neuromuscular re-education;Passive range of motion;Dry needling;Taping    PT Next Visit Plan  continue core strenth, hip and pF floor strength, stretches adding hip IR/ER gentle thoracic and lumbar stretches    Consulted and Agree with Plan of Care  Patient       Patient will benefit from skilled therapeutic intervention in order to improve the following deficits and impairments:  Increased fascial restricitons, Pain, Decreased strength, Decreased coordination, Increased muscle spasms  Visit Diagnosis: Other muscle spasm  Muscle weakness (generalized)  Unspecified lack of coordination     Problem List Patient Active Problem List   Diagnosis Date Noted  . Special screening for malignant neoplasms, colon 05/02/2017  . Incomplete passage of stool 05/02/2017  . Paresthesia 04/07/2015  . Breast cancer of upper-outer quadrant of left female breast ([place])   . Ovarian  cyst   . Osteopenia   . Cervical polyp   . Fibroid   . Kidney  stones     Zannie Cove, PT 06/24/2017, 4:18 PM  [facility] [address] [place], [place], [address] Phone: [phone]   Fax:  [phone]  Name: Rital Cavey MRN: [id number] Date of Birth: November 30, 1955

## 2017-06-24 NOTE — Patient Instructions (Signed)
   STANDING HAMSTRING STRETCH - PROPPED  Start by standing and prop your foot of the affected leg on a chair or a step.   Next, slowly lean forward until a stretch is felt behind your knee/thigh. Bend through your hips and not your spine. Hold, then return to starting position and repeat.     Hip Flexor Stretch Standing Foot on Chair  Image depicts stretching the right side hip flexors.  SETUP 1.  Stand tall in a staggered stance with your left foot back and your knee straight  2. Place your left foot on a chair, stool, bench 3. Place your hands on your pelvis to keep it from tipping down in the front.  ACTION 1. Move your trunk and pelvis forward as one unit without tipping your pelvis down in the front. *You should feel a stretch in the front of your right hip and thigh.  [facility] [address], [place], [place] [address] Phone # [phone] Fax [phone]

## 2017-07-01 ENCOUNTER — Ambulatory Visit: Payer: BLUE CROSS/BLUE SHIELD | Admitting: Physical Therapy

## 2017-07-01 DIAGNOSIS — R279 Unspecified lack of coordination: Secondary | ICD-10-CM

## 2017-07-01 DIAGNOSIS — M62838 Other muscle spasm: Secondary | ICD-10-CM | POA: Diagnosis not present

## 2017-07-01 DIAGNOSIS — M6281 Muscle weakness (generalized): Secondary | ICD-10-CM

## 2017-07-01 NOTE — Therapy (Signed)
[facility] [address] [place], [place], [address] Phone: [phone]   Fax:  [phone]  Physical Therapy Treatment  Patient Details  Name: Cynthia Rosario MRN: [id number] Date of Birth: 11-07-1955 Referring Provider: [name], MD   Encounter Date: 07/01/2017  PT End of Session - 07/01/17 1457    Visit Number  7    Date for PT Re-Evaluation  07/29/17    PT Start Time  1446    PT Stop Time  1530    PT Time Calculation (min)  44 min    Activity Tolerance  Patient tolerated treatment well    Behavior During Therapy  [facility] for tasks assessed/performed       Past Medical History:  Diagnosis Date  . Abnormal pap   . Anemia   . Breast cancer ([place])   . Cervical polyp   . Chronic headaches   . Dermoid   . Fibroid   . Kidney stones   . Osteopenia   . Ovarian cyst   . Paresthesia 04/07/2015  . Urinary urgency     Past Surgical History:  Procedure Laterality Date  . ABDOMINAL HYSTERECTOMY  09/18/13   robotic hyst, bso for metastatic breast cancer, Dr Alycia Rossetti at [facility].  Marland Kitchen BREAST ENHANCEMENT SURGERY    . BREAST LUMPECTOMY     x2  . MASTECTOMY Bilateral   . RECONSTRUCTION BREAST W/ TRAM FLAP    . WISDOM TOOTH EXTRACTION      There were no vitals filed for this visit.  Subjective Assessment - 07/01/17 1533    Subjective  Pt states she is feeling good overall.  She reports she feels like her bowels are much more normal and the toileting techniques are helping.  She reports she has a better understanding of what food to eat.    Pertinent History  TRAM flap surgery, total hysterectomy uterine CA 2015, history of breast CA estrogen receptor positive metastasized    Limitations  House hold activities;Other (comment)    Patient Stated Goals  reduce constipation, reduce bladder irritation    Currently in Pain?  No/denies                      [facility] Adult PT Treatment/Exercise - 07/01/17 0001      Knee/Hip Exercises: Aerobic   Nustep  L2 x 10 min      Knee/Hip Exercises: Standing   Hip Flexion  Stengthening;Both;10 reps;Knee bent 1.5 lb    Hip Abduction  Stengthening;Both;10 reps 1.5 lb    Hip Extension  Stengthening;Right;Left;10 reps;Knee straight 1.5 lb      Knee/Hip Exercises: Supine   Bridges  Strengthening;Both;10 reps    Other Supine Knee/Hip Exercises  ball roll out and trunk rotation - 10x each               PT Short Term Goals - 06/17/17 1510      PT SHORT TERM GOAL #1   Title  independent with urge to void techniques for bladder retraining    Time  4    Period  Weeks    Status  Achieved      PT SHORT TERM GOAL #2   Title  pt will report decreased nocturia to 2x/night at the most    Baseline  1x unless I have caffiene    Time  4    Period  Weeks    Status  Achieved      PT SHORT  TERM GOAL #3   Title  pt will be able to push one finger out of rectum due to improved muscle coordination    Baseline  bowel movements are more complete and dime to quarter size    Time  4    Period  Weeks    Status  Achieved        PT Long Term Goals - 07/01/17 1551      PT LONG TERM GOAL #1   Title  pt will be independent with self massage or dilators for improved muscle flexibility and function    Time  12    Period  Weeks    Status  On-going      PT LONG TERM GOAL #2   Title  Pt will be able to perform kegel and hold for 10 seconds for greater bladder control    Time  12    Period  Weeks    Status  On-going      PT LONG TERM GOAL #3   Title  Pt will be able to completely empty when having BM due to improved muscle coordination as indicated by only 3 BM/day at the most.    Time  12    Period  Weeks    Status  Achieved      PT LONG TERM GOAL #4   Title  Pt will have bowel movemnt that is at least quarter in diameter due to improved soft tissue.    Time  12    Period  Weeks    Status  Achieved      PT LONG TERM GOAL #5   Title  Pt will report  nocturia 1x or less per night due to improved bladder control    Time  12    Period  Weeks    Status  On-going            Plan - 07/01/17 1544    Clinical Impression Statement  Pt is able to progress difficulty of hip exercises and added quadruped.  Pt needs some cues for alignment and positions.  She demonstrated fatigue with exercises and needed cues to enage core as she fatigues.  Pt will benefit from skilled therapy to progress strength.    PT Treatment/Interventions  ADLs/Self Care Home Management;Biofeedback;Cryotherapy;English as a second language teacher;Therapeutic activities;Therapeutic exercise;Manual techniques;Patient/family education;Neuromuscular re-education;Passive range of motion;Dry needling;Taping    PT Next Visit Plan  continue core strenth, hip and pF floor strength, stretches hip IR/ER stretches, gentle thoracic and lumbar stretches    Consulted and Agree with Plan of Care  Patient       Patient will benefit from skilled therapeutic intervention in order to improve the following deficits and impairments:  Increased fascial restricitons, Pain, Decreased strength, Decreased coordination, Increased muscle spasms  Visit Diagnosis: Other muscle spasm  Muscle weakness (generalized)  Unspecified lack of coordination     Problem List Patient Active Problem List   Diagnosis Date Noted  . Special screening for malignant neoplasms, colon 05/02/2017  . Incomplete passage of stool 05/02/2017  . Paresthesia 04/07/2015  . Breast cancer of upper-outer quadrant of left female breast ([place])   . Ovarian cyst   . Osteopenia   . Cervical polyp   . Fibroid   . Kidney stones     Zannie Cove, PT 07/01/2017, 3:58 PM  [facility] [address] [place], [place], [address] Phone: [phone]   Fax:  [phone]  Name: Cynthia  Rosario MRN: [id number] Date of Birth:  1955-11-04

## 2017-07-08 ENCOUNTER — Encounter: Payer: BLUE CROSS/BLUE SHIELD | Admitting: Physical Therapy

## 2017-07-15 ENCOUNTER — Ambulatory Visit: Payer: BLUE CROSS/BLUE SHIELD | Admitting: Physical Therapy

## 2017-07-15 DIAGNOSIS — M62838 Other muscle spasm: Secondary | ICD-10-CM | POA: Diagnosis not present

## 2017-07-15 DIAGNOSIS — M6281 Muscle weakness (generalized): Secondary | ICD-10-CM

## 2017-07-15 DIAGNOSIS — R279 Unspecified lack of coordination: Secondary | ICD-10-CM

## 2017-07-15 NOTE — Therapy (Signed)
[facility] [address] [place], [place], [address] Phone: [phone]   Fax:  [phone]  Physical Therapy Treatment  Patient Details  Name: Cynthia Rosario MRN: [id number] Date of Birth: [birth year] Referring Provider: [name], MD   Encounter Date: 07/15/2017  PT End of Session - 07/15/17 1505    Visit Number  8    Date for PT Re-Evaluation  07/29/17    PT Start Time  1446    PT Stop Time  1530    PT Time Calculation (min)  44 min    Activity Tolerance  Patient tolerated treatment well    Behavior During Therapy  [facility] for tasks assessed/performed       Past Medical History:  Diagnosis Date  . Abnormal pap   . Anemia   . Breast cancer ([place])   . Cervical polyp   . Chronic headaches   . Dermoid   . Fibroid   . Kidney stones   . Osteopenia   . Ovarian cyst   . Paresthesia 04/07/2015  . Urinary urgency     Past Surgical History:  Procedure Laterality Date  . ABDOMINAL HYSTERECTOMY  09/18/13   robotic hyst, bso for metastatic breast cancer, Dr Alycia Rossetti at [facility].  Marland Kitchen BREAST ENHANCEMENT SURGERY    . BREAST LUMPECTOMY     x2  . MASTECTOMY Bilateral   . RECONSTRUCTION BREAST W/ TRAM FLAP    . WISDOM TOOTH EXTRACTION      There were no vitals filed for this visit.  Subjective Assessment - 07/15/17 1757    Subjective  I feel like my bladder symptoms have been improved lately.  I am able to hold it more and not run to the bathroom    Pertinent History  TRAM flap surgery, total hysterectomy uterine CA 2015, history of breast CA estrogen receptor positive metastasized    Limitations  House hold activities;Other (comment)    Patient Stated Goals  reduce constipation, reduce bladder irritation    Currently in Pain?  No/denies                No data recorded       [facility] Adult PT Treatment/Exercise - 07/15/17 0001      Knee/Hip Exercises: Stretches   Other Knee/Hip Stretches  internal  rotation - 3 x 30 sec; windshield wipers - 5 x each side      Knee/Hip Exercises: Aerobic   Nustep  L1 x 6 min; L3 x 4 min ; cues to engage pelvic floor and core; PT present to discuss progress     Knee/Hip Exercises: Standing   Lateral Step Up  Right;Left;10 reps;Hand Hold: 0 BOSU    Forward Step Up  Right;Left;10 reps;Hand Hold: 0 BOSU    Walking with Sports Cord  10 laps; 15#; 4 ways               PT Short Term Goals - 06/17/17 1510      PT SHORT TERM GOAL #1   Title  independent with urge to void techniques for bladder retraining    Time  4    Period  Weeks    Status  Achieved      PT SHORT TERM GOAL #2   Title  pt will report decreased nocturia to 2x/night at the most    Baseline  1x unless I have caffiene    Time  4    Period  Weeks    Status  Achieved      PT SHORT TERM GOAL #3   Title  pt will be able to push one finger out of rectum due to improved muscle coordination    Baseline  bowel movements are more complete and dime to quarter size    Time  4    Period  Weeks    Status  Achieved        PT Long Term Goals - 07/15/17 1755      PT LONG TERM GOAL #1   Title  pt will be independent with self massage or dilators for improved muscle flexibility and function    Time  12    Period  Weeks    Status  Achieved      PT LONG TERM GOAL #2   Title  Pt will be able to perform kegel and hold for 10 seconds for greater bladder control    Baseline  she is able to hold urge to void more regularly    Time  12    Period  Weeks    Status  On-going      PT LONG TERM GOAL #3   Title  Pt will be able to completely empty when having BM due to improved muscle coordination as indicated by only 3 BM/day at the most.    Time  12    Period  Weeks    Status  Achieved      PT LONG TERM GOAL #4   Title  Pt will have bowel movemnt that is at least quarter in diameter due to improved soft tissue.    Time  12    Period  Weeks    Status  Achieved      PT LONG TERM GOAL  #5   Title  Pt will report nocturia 1x or less per night due to improved bladder control    Time  12    Period  Weeks    Status  On-going            Plan - 07/15/17 1510    Clinical Impression Statement  Patient continues to demonstrate progress with exercises.  Able to increase difficulty with core and lumbar stability and did step ups on BOSU without UE support.  Pt is doing well engaging core and pelvic floor muscles throughout treatment session.  She will benefit from skilled therapy to work towards achieving functional goals     PT Treatment/Interventions  ADLs/Self Care Home Management;Biofeedback;Cryotherapy;English as a second language teacher;Therapeutic activities;Therapeutic exercise;Manual techniques;Patient/family education;Neuromuscular re-education;Passive range of motion;Dry needling;Taping    PT Next Visit Plan  continue core strenth, hip and pF floor strength, gentle thoracic and lumbar stretches, pelvic floor endurance for urinary retention and reduced nocturia    Consulted and Agree with Plan of Care  Patient       Patient will benefit from skilled therapeutic intervention in order to improve the following deficits and impairments:  Increased fascial restricitons, Pain, Decreased strength, Decreased coordination, Increased muscle spasms  Visit Diagnosis: Other muscle spasm  Muscle weakness (generalized)  Unspecified lack of coordination     Problem List Patient Active Problem List   Diagnosis Date Noted  . Special screening for malignant neoplasms, colon 05/02/2017  . Incomplete passage of stool 05/02/2017  . Paresthesia 04/07/2015  . Breast cancer of upper-outer quadrant of left female breast ([place])   . Ovarian cyst   . Osteopenia   . Cervical polyp   . Fibroid   . Kidney stones  Zannie Cove, PT 07/15/2017, 6:00 PM  [facility] [address] [place], [place], [address] Phone: [phone]   Fax:  [phone]  Name: Cynthia Rosario MRN: [id number] Date of Birth: 1955-11-01

## 2017-07-22 ENCOUNTER — Encounter: Payer: Self-pay | Admitting: Physical Therapy

## 2017-07-22 ENCOUNTER — Ambulatory Visit: Payer: BLUE CROSS/BLUE SHIELD | Attending: Hematology and Oncology | Admitting: Physical Therapy

## 2017-07-22 DIAGNOSIS — R279 Unspecified lack of coordination: Secondary | ICD-10-CM | POA: Diagnosis present

## 2017-07-22 DIAGNOSIS — M62838 Other muscle spasm: Secondary | ICD-10-CM | POA: Insufficient documentation

## 2017-07-22 DIAGNOSIS — M6281 Muscle weakness (generalized): Secondary | ICD-10-CM | POA: Diagnosis present

## 2017-07-22 NOTE — Therapy (Addendum)
[facility] [address] [place], [place], [address] Phone: [phone]   Fax:  [phone]  Physical Therapy Treatment  Patient Details  Name: Cynthia Rosario MRN: [id number] Date of Birth: 16-Oct-1955 Referring Provider: [name], MD   Encounter Date: 07/22/2017  PT End of Session - 07/22/17 1454    Visit Number  9    Date for PT Re-Evaluation  07/29/17    PT Start Time  3007    PT Stop Time  1530    PT Time Calculation (min)  43 min    Activity Tolerance  Patient tolerated treatment well    Behavior During Therapy  [facility] for tasks assessed/performed       Past Medical History:  Diagnosis Date  . Abnormal pap   . Anemia   . Breast cancer ([place])   . Cervical polyp   . Chronic headaches   . Dermoid   . Fibroid   . Kidney stones   . Osteopenia   . Ovarian cyst   . Paresthesia 04/07/2015  . Urinary urgency     Past Surgical History:  Procedure Laterality Date  . ABDOMINAL HYSTERECTOMY  09/18/13   robotic hyst, bso for metastatic breast cancer, Dr Alycia Rossetti at [facility].  Marland Kitchen BREAST ENHANCEMENT SURGERY    . BREAST LUMPECTOMY     x2  . MASTECTOMY Bilateral   . RECONSTRUCTION BREAST W/ TRAM FLAP    . WISDOM TOOTH EXTRACTION      There were no vitals filed for this visit.  Subjective Assessment - 07/22/17 1542    Subjective  Overall feeling better.  I have been eating some different things and have been very stopped up and my belly is hard and swollen today.  I have had less energy.    Pertinent History  TRAM flap surgery, total hysterectomy uterine CA 2015, history of breast CA estrogen receptor positive metastasized    Limitations  House hold activities;Other (comment)    Patient Stated Goals  reduce constipation, reduce bladder irritation    Currently in Pain?  No/denies                       [facility] Adult PT Treatment/Exercise - 07/22/17 0001      Neuro Re-ed    Neuro Re-ed Details   TrA  activation      Knee/Hip Exercises: Aerobic   Nustep  L1 x 10 min PT present to get status update      Knee/Hip Exercises: Standing   Other Standing Knee Exercises  TrA engaged with bent rows using yellow band - 10 reps      Knee/Hip Exercises: Seated   Sit to Sand  10 reps TrA with squats using chair as guide      Manual Therapy   Myofascial Release  abdominal myfascial release and educated on performing to self at home               PT Short Term Goals - 06/17/17 1510      PT SHORT TERM GOAL #1   Title  independent with urge to void techniques for bladder retraining    Time  4    Period  Weeks    Status  Achieved      PT SHORT TERM GOAL #2   Title  pt will report decreased nocturia to 2x/night at the most    Baseline  1x unless I have caffiene    Time  4    Period  Weeks    Status  Achieved      PT SHORT TERM GOAL #3   Title  pt will be able to push one finger out of rectum due to improved muscle coordination    Baseline  bowel movements are more complete and dime to quarter size    Time  4    Period  Weeks    Status  Achieved        PT Long Term Goals - 07/15/17 1755      PT LONG TERM GOAL #1   Title  pt will be independent with self massage or dilators for improved muscle flexibility and function    Time  12    Period  Weeks    Status  Achieved      PT LONG TERM GOAL #2   Title  Pt will be able to perform kegel and hold for 10 seconds for greater bladder control    Baseline  she is able to hold urge to void more regularly    Time  12    Period  Weeks    Status  On-going      PT LONG TERM GOAL #3   Title  Pt will be able to completely empty when having BM due to improved muscle coordination as indicated by only 3 BM/day at the most.    Time  12    Period  Weeks    Status  Achieved      PT LONG TERM GOAL #4   Title  Pt will have bowel movemnt that is at least quarter in diameter due to improved soft tissue.    Time  12    Period  Weeks     Status  Achieved      PT LONG TERM GOAL #5   Title  Pt will report nocturia 1x or less per night due to improved bladder control    Time  12    Period  Weeks    Status  On-going            Plan - 07/22/17 1536    Clinical Impression Statement  Pt has abdominal distension today so focused on some myofascial release.  Pt was educated in exercises to activate TrA.  She is doing well with exercises but was educated in making sure she performs them more consistently in order to see results.  Pt will benefit from skilled PT to ensure she is performing exercises correctly for successful transition to HEP.    PT Treatment/Interventions  ADLs/Self Care Home Management;Biofeedback;Cryotherapy;English as a second language teacher;Therapeutic activities;Therapeutic exercise;Manual techniques;Patient/family education;Neuromuscular re-education;Passive range of motion;Dry needling;Taping    PT Next Visit Plan  review goals and final HEP for discharge next    Consulted and Agree with Plan of Care  Patient       Patient will benefit from skilled therapeutic intervention in order to improve the following deficits and impairments:  Increased fascial restricitons, Pain, Decreased strength, Decreased coordination, Increased muscle spasms  Visit Diagnosis: Other muscle spasm  Muscle weakness (generalized)  Unspecified lack of coordination     Problem List Patient Active Problem List   Diagnosis Date Noted  . Special screening for malignant neoplasms, colon 05/02/2017  . Incomplete passage of stool 05/02/2017  . Paresthesia 04/07/2015  . Breast cancer of upper-outer quadrant of left female breast ([place])   . Ovarian cyst   . Osteopenia   . Cervical polyp   .  Fibroid   . Kidney stones     Zannie Cove, PT 07/22/2017, 3:43 PM  [facility] [address] [place], [place], [address] Phone:  [phone]   Fax:  [phone]  Name: Cynthia Rosario MRN: [id number] Date of Birth: 1956/03/26  PHYSICAL THERAPY DISCHARGE SUMMARY  Visits from Start of Care: 9  Current functional level related to goals / functional outcomes: See above details   Remaining deficits: See above details   Education / Equipment: HEP  Plan: Patient agrees to discharge.  Patient goals were partially met. Patient is being discharged due to not returning since the last visit.  ?????    Pt was planning on discharge next session but did not return due to hospitalization  Zannie Cove, PT 08/12/17 4:41 PM

## 2017-07-29 ENCOUNTER — Encounter (HOSPITAL_COMMUNITY): Payer: Self-pay

## 2017-07-29 ENCOUNTER — Emergency Department (HOSPITAL_COMMUNITY)
Admission: EM | Admit: 2017-07-29 | Discharge: 2017-07-29 | Disposition: A | Discharge status: Home / Self Care | Payer: BLUE CROSS/BLUE SHIELD | Attending: Emergency Medicine | Admitting: Emergency Medicine

## 2017-07-29 ENCOUNTER — Telehealth: Payer: Self-pay | Admitting: Hematology and Oncology

## 2017-07-29 ENCOUNTER — Emergency Department (HOSPITAL_COMMUNITY): Payer: BLUE CROSS/BLUE SHIELD

## 2017-07-29 ENCOUNTER — Encounter: Payer: BLUE CROSS/BLUE SHIELD | Admitting: Physical Therapy

## 2017-07-29 ENCOUNTER — Other Ambulatory Visit: Payer: Self-pay

## 2017-07-29 ENCOUNTER — Inpatient Hospital Stay: Payer: BLUE CROSS/BLUE SHIELD | Attending: Hematology and Oncology | Admitting: Hematology and Oncology

## 2017-07-29 VITALS — BP 124/82 | HR 104 | Temp 98.4°F | Resp 20 | Ht 68.5 in | Wt 132.9 lb

## 2017-07-29 DIAGNOSIS — C786 Secondary malignant neoplasm of retroperitoneum and peritoneum: Secondary | ICD-10-CM | POA: Diagnosis not present

## 2017-07-29 DIAGNOSIS — C7951 Secondary malignant neoplasm of bone: Secondary | ICD-10-CM | POA: Insufficient documentation

## 2017-07-29 DIAGNOSIS — Z90722 Acquired absence of ovaries, bilateral: Secondary | ICD-10-CM | POA: Insufficient documentation

## 2017-07-29 DIAGNOSIS — C50919 Malignant neoplasm of unspecified site of unspecified female breast: Secondary | ICD-10-CM

## 2017-07-29 DIAGNOSIS — R140 Abdominal distension (gaseous): Secondary | ICD-10-CM | POA: Insufficient documentation

## 2017-07-29 DIAGNOSIS — C50412 Malignant neoplasm of upper-outer quadrant of left female breast: Secondary | ICD-10-CM | POA: Insufficient documentation

## 2017-07-29 DIAGNOSIS — C801 Malignant (primary) neoplasm, unspecified: Secondary | ICD-10-CM

## 2017-07-29 DIAGNOSIS — Z170 Estrogen receptor positive status [ER+]: Secondary | ICD-10-CM | POA: Diagnosis not present

## 2017-07-29 DIAGNOSIS — Z87891 Personal history of nicotine dependence: Secondary | ICD-10-CM | POA: Diagnosis not present

## 2017-07-29 DIAGNOSIS — Z923 Personal history of irradiation: Secondary | ICD-10-CM | POA: Diagnosis not present

## 2017-07-29 DIAGNOSIS — R188 Other ascites: Secondary | ICD-10-CM | POA: Insufficient documentation

## 2017-07-29 DIAGNOSIS — Z79899 Other long term (current) drug therapy: Secondary | ICD-10-CM | POA: Diagnosis not present

## 2017-07-29 DIAGNOSIS — Z90710 Acquired absence of both cervix and uterus: Secondary | ICD-10-CM | POA: Diagnosis not present

## 2017-07-29 DIAGNOSIS — Z9012 Acquired absence of left breast and nipple: Secondary | ICD-10-CM | POA: Diagnosis not present

## 2017-07-29 LAB — COMPREHENSIVE METABOLIC PANEL
ALBUMIN: 3.1 g/dL — AB (ref 3.5–5.0)
ALT: 8 U/L — ABNORMAL LOW (ref 14–54)
ANION GAP: 12 (ref 5–15)
AST: 19 U/L (ref 15–41)
Alkaline Phosphatase: 115 U/L (ref 38–126)
BUN: 12 mg/dL (ref 6–20)
CALCIUM: 9.1 mg/dL (ref 8.9–10.3)
CHLORIDE: 99 mmol/L — AB (ref 101–111)
CO2: 29 mmol/L (ref 22–32)
Creatinine, Ser: 0.75 mg/dL (ref 0.44–1.00)
GFR calc non Af Amer: >60 mL/min (ref >60)
GLUCOSE: 101 mg/dL — AB (ref 65–99)
Potassium: 3.8 mmol/L (ref 3.5–5.1)
SODIUM: 140 mmol/L (ref 135–145)
Total Bilirubin: 0.5 mg/dL (ref 0.3–1.2)
Total Protein: 7.2 g/dL (ref 6.5–8.1)

## 2017-07-29 LAB — URINALYSIS, ROUTINE W REFLEX MICROSCOPIC
Glucose, UA: NEGATIVE mg/dL
Hgb urine dipstick: NEGATIVE
Ketones, ur: 5 mg/dL — AB
Nitrite: NEGATIVE
PH: 5 (ref 5.0–8.0)
Protein, ur: 30 mg/dL — AB
SPECIFIC GRAVITY, URINE: 1.033 — AB (ref 1.005–1.030)

## 2017-07-29 LAB — CBC
HCT: 34.9 % — ABNORMAL LOW (ref 36.0–46.0)
HEMOGLOBIN: 10.9 g/dL — AB (ref 12.0–15.0)
MCH: 28.7 pg (ref 26.0–34.0)
MCHC: 31.2 g/dL (ref 30.0–36.0)
MCV: 91.8 fL (ref 78.0–100.0)
Platelets: 654 10*3/uL — ABNORMAL HIGH (ref 150–400)
RBC: 3.8 MIL/uL — AB (ref 3.87–5.11)
RDW: 14.2 % (ref 11.5–15.5)
WBC: 10.1 10*3/uL (ref 4.0–10.5)

## 2017-07-29 LAB — LIPASE, BLOOD: LIPASE: 39 U/L (ref 11–51)

## 2017-07-29 MED ORDER — IOPAMIDOL (ISOVUE-300) INJECTION 61%
100.0000 mL | Freq: Once | INTRAVENOUS | Status: AC | PRN
  Administered 2017-07-29: 100 mL via INTRAVENOUS

## 2017-07-29 MED ORDER — IOPAMIDOL (ISOVUE-300) INJECTION 61%
INTRAVENOUS | Status: AC
  Filled 2017-07-29: qty 100

## 2017-07-29 NOTE — ED Triage Notes (Signed)
Patient c/o abdominal bloating x 1 week. patient states she ws constipated and took dulcolax, and metamucil over a few days. ptient states she had a BM and is now having smll amounts of diarrhea.

## 2017-07-29 NOTE — Assessment & Plan Note (Signed)
Metastatic breast cancer with previous bone metastases and newly diagnosed omental/peritoneal carcinomatosis with massive ascites 07/29/2017 Previously she refused antiestrogen therapy. I saw the patient urgently after she presented to the emergency room.   Plan: 1.  Diagnostic and therapeutic paracentesis to determine the histology and receptor testing 2. if it is metastatic breast cancer estrogen receptor positive disease, she would likely benefit from antiestrogen therapy along with CDK 4 and 6 inhibitor like Ibrance.    I discussed with the patient that her prognosis would depend on response to the treatment.  If she responds adequately then she would have a reasonable prognosis.  We talked about different treatment options including chemotherapy.  Return to clinic next Monday to discuss the pathology report

## 2017-07-29 NOTE — Progress Notes (Signed)
Patient Care Team: [name], MD as PCP - General (Hematology and Oncology) [name], MD as Consulting Physician (Hematology and Oncology)  DIAGNOSIS:  Encounter Diagnoses  Name Primary?  . Malignant neoplasm of upper-outer quadrant of left breast in female, estrogen receptor positive ([place]) Yes  . Peritoneal carcinomatosis ([place])     SUMMARY OF ONCOLOGIC HISTORY:   Breast cancer of upper-outer quadrant of left female breast ([place])   01/18/2002 Initial Diagnosis    Breast cancer of upper-outer quadrant of left female breast; left mastectomy 2/26 lymph nodes positive ER positive received adjuvant TAC chemotherapy followed by radiation and tamoxifen for 9 months stopped for side effects      01/19/2011 Relapse/Recurrence    Right breast augmentation surgery was attempted by Dr. Truman Hayward and was found to have a lesion in the upper outer quadrant 3.3 x 1.8 cm biopsy revealed invasive ductal carcinoma with lobular features ER 100% PR 88% HER-2 negative      02/06/2011 Surgery    Right breast mastectomy at [facility] and axillary lymph node dissection with removal of breast implant followed by immediate reconstruction with tissue expander 36 lymph nodes were positive received radiation therapy and no chemotherapy      03/21/2011 - 04/20/2011 Radiation Therapy    Radiation to chest wall and axilla      05/29/2011 - 12/31/2011 Anti-estrogen oral therapy    Patient received Arimidex for one week and Zoladex x5 months stop Arimidex due to side effects      07/23/2013 Relapse/Recurrence    2 years of back pain and abdominal symptoms with spotting after intercourse revealed uterus enlarged biopsy revealed metastatic breast cancer lobular type: ER positive PR negative      09/18/2013 Surgery    Hysterectomy and bilateral salpingo-oophorectomy at [facility] revealed metastatic breast cancer involving tubes and uterus      10/13/2013 - 10/20/2013 Anti-estrogen oral therapy    Femara for 30 days stopped for  patchy alopecia and patient going for second and third opinions between [place], [place], [place] regarding the appropriate antiestrogen plan (stopped by choice)      02/02/2015 Imaging    Multiple osseous metastases are present. Foci of enhancement are described at C7, T2, T6, T7, T9, and T10      07/29/2017 Imaging    ED visit for abdominal distention: CT abdomen pelvis: Significant ascites with omental caking and scattered areas of peritoneal enhancement/nodularity consistent with peritoneal carcinomatosis, osseous metastases, wall thickening rectosigmoid colon due to serosal implants       CHIEF COMPLIANT: Urgent follow-up from emergency room  INTERVAL HISTORY: Cynthia Rosario is a 62 year old with above-mentioned history of metastatic breast cancer with bone metastases who had refused antiestrogen therapy and even refused scans who presented to the emergency room with a one-week history of abdominal distention and was noted to have massive ascites with peritoneal carcinomatosis and omental caking.  There were additional bone metastases also noted.  We asked her to come straight from the emergency room to our office.  She has been having increasing difficulty with her bowel movements.  She has not been able to eat adequately because of abdominal distention.  She also feels somewhat difficult with breathing primarily because of the massive ascites raising the diaphragm.  Abdominal distention from ascites  REVIEW OF SYSTEMS:   Constitutional: Denies fevers, chills or abnormal weight loss Eyes: Denies blurriness of vision Ears, nose, mouth, throat, and face: Denies mucositis or sore throat Respiratory: Denies cough, dyspnea or wheezes  Cardiovascular: Denies palpitation, chest discomfort Gastrointestinal: Marked abdominal distention from ascites, constipation Skin: Denies abnormal skin rashes Lymphatics: Denies new lymphadenopathy or easy bruising Neurological:Denies numbness, tingling or new  weaknesses Behavioral/Psych: Mood is stable, no new changes  Extremities: No lower extremity edema All other systems were reviewed with the patient and are negative.  I have reviewed the past medical history, past surgical history, social history and family history with the patient and they are unchanged from previous note.  ALLERGIES:  has No Known Allergies.  MEDICATIONS:  Current Outpatient Medications  Medication Sig Dispense Refill  . alum & mag hydroxide-simeth (GELUSIL) 623-762-83 MG chewable tablet Chew 3 mLs by mouth every 6 (six) hours as needed for indigestion or heartburn.    . Ascorbic Acid (VITAMIN C PO) Take 1 tablet by mouth daily.    . Aspirin Effervescent (ALKA-SELTZER ORIGINAL PO) Take 2 tablets by mouth daily as needed (upset stomach).    . bisacodyl (DULCOLAX) 10 MG suppository Place 5 mg rectally as needed for moderate constipation.    . Carboxymethylcellulose Sodium (EYE DROPS OP) Place 2 drops into both eyes daily as needed (dry eyes).    Mariane Baumgarten Sodium (COLACE PO) Take 1 tablet by mouth daily as needed (constipation).    Marland Kitchen ibuprofen (ADVIL,MOTRIN) 200 MG tablet Take 400 mg by mouth as needed.     . Liniments (SALONPAS PAIN RELIEF PATCH EX) Apply 1 patch topically daily as needed (pain).     No current facility-administered medications for this visit.     PHYSICAL EXAMINATION: ECOG PERFORMANCE STATUS: 1 - Symptomatic but completely ambulatory  Vitals:   07/29/17 1451  BP: 124/82  Pulse: (!) 104  Resp: 20  Temp: 98.4 F (36.9 C)  SpO2: 100%   Filed Weights   07/29/17 1451  Weight: 132 lb 14.4 oz (60.3 kg)    GENERAL:alert, no distress and comfortable SKIN: skin color, texture, turgor are normal, no rashes or significant lesions EYES: normal, Conjunctiva are pink and non-injected, sclera clear OROPHARYNX:no exudate, no erythema and lips, buccal mucosa, and tongue normal  NECK: supple, thyroid normal size, non-tender, without nodularity LYMPH:  no  palpable lymphadenopathy in the cervical, axillary or inguinal LUNGS: clear to auscultation and percussion with normal breathing effort HEART: regular rate & rhythm and no murmurs and no lower extremity edema ABDOMEN: Abdominal distention from ascites MUSCULOSKELETAL:no cyanosis of digits and no clubbing  NEURO: alert & oriented x 3 with fluent speech, no focal motor/sensory deficits EXTREMITIES: No lower extremity edema  LABORATORY DATA:  I have reviewed the data as listed CMP Latest Ref Rng & Units 07/29/2017 03/05/2017 09/03/2016  Glucose 65 - 99 mg/dL 101(H) 91 94  BUN 6 - 20 mg/dL 12 17.0 16.6  Creatinine 0.44 - 1.00 mg/dL 0.75 0.8 0.8  Sodium 135 - 145 mmol/L 140 141 142  Potassium 3.5 - 5.1 mmol/L 3.8 3.9 4.3  Chloride 101 - 111 mmol/L 99(L) - -  CO2 22 - 32 mmol/L _0 Calcium 8.9 - 10.3 mg/dL 9.1 9.2 9.9  Total Protein 6.5 - 8.1 g/dL 7.2 6.6 7.3  Total Bilirubin 0.3 - 1.2 mg/dL 0.5 0.38 0.53  Alkaline Phos 38 - 126 U/L 115 94 101  AST 15 - 41 U/L _1 ALT 14 - 54 U/L 8(L) 10 11    Lab Results  Component Value Date   WBC 10.1 07/29/2017   HGB 10.9 (L) 07/29/2017   HCT 34.9 (L) 07/29/2017   MCV  91.8 07/29/2017   PLT 654 (H) 07/29/2017   NEUTROABS 3.9 03/05/2017    ASSESSMENT & PLAN:  Breast cancer of upper-outer quadrant of left female breast Metastatic breast cancer with previous bone metastases and newly diagnosed omental/peritoneal carcinomatosis with massive ascites 07/29/2017 Previously she refused antiestrogen therapy. I saw the patient urgently after she presented to the emergency room.   Plan: 1.  Diagnostic and therapeutic paracentesis to determine the histology and receptor testing: This will be done tomorrow 2. if it is metastatic breast cancer estrogen receptor positive disease, she would likely benefit from antiestrogen therapy along with CDK 4 and 6 inhibitor like Ibrance.    I discussed with the patient that her prognosis would depend on  response to the treatment.  If she responds adequately then she would have a reasonable prognosis.  We talked about different treatment options including chemotherapy.  Return to clinic next Monday to discuss the pathology report  Orders Placed This Encounter  Procedures  . US Paracentesis    Standing Status:   Future    Standing Expiration Date:   07/29/2018    Order Specific Question:   If therapeutic, is there a maximum amount of fluid to be removed?    Answer:   No    Order Specific Question:   Are labs required for specimen collection?    Answer:   Yes    Order Specific Question:   Lab orders requested (DO NOT place separate lab orders, these will be automatically ordered during procedure specimen collection):    Answer:   Glucose, Body Fluid    Comments:   protein    Order Specific Question:   Lab orders requested (DO NOT place separate lab orders, these will be automatically ordered during procedure specimen collection):    Answer:   Cytology - Non Pap    Order Specific Question:   Lab orders requested (DO NOT place separate lab orders, these will be automatically ordered during procedure specimen collection):    Answer:   Lactate Dehydrogenase, Body Fluid    Order Specific Question:   Lab orders requested (DO NOT place separate lab orders, these will be automatically ordered during procedure specimen collection):    Answer:   Protein, Body Fluid    Order Specific Question:   Lab orders requested (DO NOT place separate lab orders, these will be automatically ordered during procedure specimen collection):    Answer:   Other    Order Specific Question:   Is Albumin medication needed?    Answer:   No    Order Specific Question:   Reason for Exam (SYMPTOM  OR DIAGNOSIS REQUIRED)    Answer:   metastatic cancer causing ? malignant ascites    Order Specific Question:   Preferred imaging location?    Answer:   [facility]   The patient has a good understanding of the overall  plan. she agrees with it. she will call with any problems that may develop before the next visit here.   Harriette Ohara, MD 07/29/17

## 2017-07-29 NOTE — Telephone Encounter (Signed)
Gave patient AVs and calendar of upcoming April appointments.  °

## 2017-07-29 NOTE — ED Provider Notes (Signed)
[facility] DEPT Provider Note   CSN: [id number] Arrival date & time: 07/29/17  0931     History   Chief Complaint Chief Complaint  Patient presents with  . Bloated  . Constipation  . Fever    HPI Cynthia Rosario is a 62 y.o. female with past medical history of metastatic breast cancer, presenting to the ED with 1 week of abdominal distention and constipation.  Patient states about 1 week ago she noticed her abdomen is slightly bloated, which is been worsening since that time.  She states her last normal bowel movement was about 3 days ago, however was small.  She has been taking over-the-counter stool softeners and laxatives without much relief.  She states last bowel movement was watery and nonbloody.  Associated fevers the last 3 days, with T-max of 100.3 F.  Denies nausea or vomiting, abdominal pain, urinary symptoms.  Patient was evaluated by our gastroenterology on 05/02/2017 for recurrent bowel issues consisting of incomplete emptying and frequent bowel movements.  Patient is status post total abdominal hysterectomy and undergoing physical therapy for pelvic floor strengthening.  GI recommended outpatient colonoscopy and continued PT.  Patient states she canceled her colonoscopy appointment and has not had any imaging on her abdomen since that time.  Reports bowel movement issues are unchanged, however the abdominal distention is new.    Not currently undergoing any chemotherapy or radiation treatment.  The history is provided by the patient and medical records.    Past Medical History:  Diagnosis Date  . Abnormal pap   . Anemia   . Breast cancer ([place])   . Cervical polyp   . Chronic headaches   . Dermoid   . Fibroid   . Kidney stones   . Osteopenia   . Ovarian cyst   . Paresthesia 04/07/2015  . Urinary urgency     Patient Active Problem List   Diagnosis Date Noted  . Special screening for malignant neoplasms, colon 05/02/2017  .  Incomplete passage of stool 05/02/2017  . Paresthesia 04/07/2015  . Breast cancer of upper-outer quadrant of left female breast ([place])   . Ovarian cyst   . Osteopenia   . Cervical polyp   . Fibroid   . Kidney stones     Past Surgical History:  Procedure Laterality Date  . ABDOMINAL HYSTERECTOMY  09/18/13   robotic hyst, bso for metastatic breast cancer, Dr Alycia Rossetti at [facility]  Marland Kitchen BREAST ENHANCEMENT SURGERY    . BREAST LUMPECTOMY     x2  . MASTECTOMY Bilateral   . RECONSTRUCTION BREAST W/ TRAM FLAP    . WISDOM TOOTH EXTRACTION       OB History    Gravida  3   Para  2   Term      Preterm      AB  1   Living  2     SAB  1   TAB      Ectopic      Multiple      Live Births               Home Medications    Prior to Admission medications   Medication Sig Start Date End Date Taking? Authorizing Provider  alum & mag hydroxide-simeth (GELUSIL) [id number] MG chewable tablet Chew 3 mLs by mouth every 6 (six) hours as needed for indigestion or heartburn.   Yes [provider]  Ascorbic Acid (VITAMIN C PO) Take 1 tablet by mouth daily.  Yes [provider]  Aspirin Effervescent (ALKA-SELTZER ORIGINAL PO) Take 2 tablets by mouth daily as needed (upset stomach).   Yes [provider]  bisacodyl (DULCOLAX) 10 MG suppository Place 5 mg rectally as needed for moderate constipation.   Yes [provider]  Carboxymethylcellulose Sodium (EYE DROPS OP) Place 2 drops into both eyes daily as needed (dry eyes).   Yes [provider]  Docusate Sodium (COLACE PO) Take 1 tablet by mouth daily as needed (constipation).   Yes [provider]  ibuprofen (ADVIL,MOTRIN) 200 MG tablet Take 400 mg by mouth as needed.    Yes [provider]  Liniments (SALONPAS PAIN RELIEF PATCH EX) Apply 1 patch topically daily as needed (pain).   Yes [provider]    Family History Family History  Problem Relation Age of Onset  .  Heart disease Father   . Hypertension Mother   . Stomach cancer Paternal Grandmother     Social History Social History   Tobacco Use  . Smoking status: Former Smoker    Last attempt to quit: 1983    Years since quitting: 36.2  . Smokeless tobacco: Never Used  Substance Use Topics  . Alcohol use: No  . Drug use: No     Allergies   Patient has no known allergies.   Review of Systems Review of Systems  Constitutional: Positive for fever.  Respiratory: Negative for shortness of breath.   Gastrointestinal: Positive for abdominal distention, constipation and diarrhea. Negative for abdominal pain, blood in stool, nausea and vomiting.  Genitourinary: Negative for dysuria and frequency.  All other systems reviewed and are negative.    Physical Exam Updated Vital Signs BP 124/75   Pulse 96   Temp 98.1 F (36.7 C)   Resp 15   Ht 5' 8.5" (1.74 m)   Wt 59 kg (130 lb)   SpO2 99%   BMI 19.48 kg/m   Physical Exam  Constitutional: She appears well-developed and well-nourished. No distress.  HENT:  Head: Normocephalic and atraumatic.  Mouth/Throat: Oropharynx is clear and moist.  Eyes: Conjunctivae are normal.  Cardiovascular: Normal rate, regular rhythm, normal heart sounds and intact distal pulses.  Pulmonary/Chest: Effort normal and breath sounds normal. No respiratory distress.  Abdominal: Soft. Bowel sounds are normal. She exhibits distension. She exhibits no mass. There is no tenderness. There is no rebound and no guarding.  Neurological: She is alert.  Skin: Skin is warm.  Psychiatric: She has a normal mood and affect. Her behavior is normal.  Nursing note and vitals reviewed.    ED Treatments / Results  Labs (all labs ordered are listed, but only abnormal results are displayed) Labs Reviewed  COMPREHENSIVE METABOLIC PANEL - Abnormal; Notable for the following components:      Result Value   Chloride 99 (*)    Glucose, Bld 101 (*)    Albumin 3.1 (*)    ALT  8 (*)    All other components within normal limits  CBC - Abnormal; Notable for the following components:   RBC 3.80 (*)    Hemoglobin 10.9 (*)    HCT 34.9 (*)    Platelets 654 (*)    All other components within normal limits  URINALYSIS, ROUTINE W REFLEX MICROSCOPIC - Abnormal; Notable for the following components:   Color, Urine AMBER (*)    Specific Gravity, Urine 1.033 (*)    Bilirubin Urine SMALL (*)    Ketones, ur 5 (*)    Protein,  ur 30 (*)    Leukocytes, UA TRACE (*)    Bacteria, UA RARE (*)    Squamous Epithelial / LPF 0-5 (*)    All other components within normal limits  LIPASE, BLOOD    EKG None  Radiology Ct Abdomen Pelvis W Contrast  Result Date: 07/29/2017 CLINICAL DATA:  Abdominal distension for 1 week getting worse, breast cancer, former smoker EXAM: CT ABDOMEN AND PELVIS WITH CONTRAST TECHNIQUE: Multidetector CT imaging of the abdomen and pelvis was performed using the standard protocol following bolus administration of intravenous contrast. Sagittal and coronal MPR images reconstructed from axial data set. CONTRAST:  158mL ISOVUE-300 IOPAMIDOL (ISOVUE-300) INJECTION 61% IV. No oral contrast. COMPARISON:  10/30/2006 FINDINGS: Lower chest: BILATERAL breast prostheses. Tiny BILATERAL pleural effusions and bibasilar atelectasis. Hepatobiliary: New subcapsular cyst anterior LEFT lobe liver image 22. Gallbladder liver otherwise normal appearance Pancreas: Normal appearance Spleen: Normal appearance Adrenals/Urinary Tract: Adrenal glands normal appearance. Small LEFT renal cysts. Kidneys, ureters and bladder otherwise normal appearance. Stomach/Bowel: Mild rectosigmoid wall thickening. Gastric wall minimally prominent but stomach is under distended limiting assessment. Remaining bowel loops unremarkable. Appendix not visualized. Vascular/Lymphatic: Vascular structures patent with mild scattered atherosclerotic calcification of the abdominal aorta and iliac arteries. Aorta normal  caliber. Few normal sized retroperitoneal lymph nodes. Reproductive: Uterus surgically absent. Nonvisualization of ovaries. Other: Significant ascites. Omental caking with additional peritoneal enhancement at the pericolic gutters and at the pelvis compatible with peritoneal carcinomatosis. Bowel displaced centrally. No hernia. No free air. Musculoskeletal: Multiple lytic metastases identified including the pelvis bilaterally, multiple thoracolumbar vertebra, and the lateral LEFT eighth rib which likely has a pathologic fracture. IMPRESSION: Significant ascites with omental caking and scattered areas of peritoneal enhancement/slight nodularity consistent with peritoneal carcinomatosis. Osseous metastases. Mild wall thickening of the rectosigmoid colon, majority of which is intraperitoneal, question serosal implants though segmental distal colitis not excluded. Tiny bibasilar pleural effusions and minimal atelectasis. Electronically Signed   By: Lavonia Dana M.D.   On: 07/29/2017 11:33    Procedures Procedures (including critical care time)  Medications Ordered in ED Medications  iopamidol (ISOVUE-300) 61 % injection (has no administration in time range)  iopamidol (ISOVUE-300) 61 % injection 100 mL (100 mLs Intravenous Contrast Given 07/29/17 1105)     Initial Impression / Assessment and Plan / ED Course  I have reviewed the triage vital signs and the nursing notes.  Pertinent labs & imaging results that were available during my care of the patient were reviewed by me and considered in my medical decision making (see chart for details).  Clinical Course as of Jul 30 1418  Mon Jul 29, 2017  1245 CT results discussed with Dr. Francia Greaves. Explained results to patient and she verbalized understanding. Answered all questions to the best of my ability. Discussed plan to discuss results with Oncologist.   [JR]  1410 Spoke with Dr. Lindi Adie, patient's oncologist, who recommends pt report to his clinic today  upon discharge to discuss CT results.   [JR]    Clinical Course User Index [JR] Jacci Ruberg, [place] N, PA-C    Patient with past medical history of metastatic breast cancer, presenting to the ED with 1 week of worsening abdominal distention and inconsistent bowel movements, treated with laxatives and stool softeners.  On exam, abdomen is distended, however without tenderness or peritoneal signs.  Given patient's past medical history and acute abdominal changes, CT abdomen ordered.  Labs without leukocytosis, CMP unremarkable.  CT abdomen showing findings consistent with additional metastasis to peritoneum,  omentum, and pelvis.  Discussed these results with patient.  Also spoke with Dr. Lindi Adie, pt's oncologist, who will see patient in the clinic today upon discharge from the ED to further discuss results.  Pt is afebrile with stable vital signs, no evidence of other acute/emergent medical problems today. Safe for discharge.  Patient discussed with Dr. Francia Greaves.  Discussed results, findings, treatment and follow up. Patient advised of return precautions. Patient verbalized understanding and agreed with plan.  Final Clinical Impressions(s) / ED Diagnoses   Final diagnoses:  Metastatic breast cancer [facility])  Other ascites    ED Discharge Orders    None       Dairon Procter, [place] N, PA-C 07/29/17 1422    Valarie Merino, MD 07/29/17 1447

## 2017-07-29 NOTE — Discharge Instructions (Addendum)
Report to Dr. Geralyn Flash clinic upon leaving this Emergency Department, to review your CT scan results.

## 2017-07-30 ENCOUNTER — Ambulatory Visit (HOSPITAL_COMMUNITY)
Admission: RE | Admit: 2017-07-30 | Discharge: 2017-07-30 | Disposition: A | Discharge status: Home / Self Care | Payer: BLUE CROSS/BLUE SHIELD | Source: Ambulatory Visit | Attending: Adult Health | Admitting: Adult Health

## 2017-07-30 DIAGNOSIS — Z170 Estrogen receptor positive status [ER+]: Secondary | ICD-10-CM | POA: Diagnosis not present

## 2017-07-30 DIAGNOSIS — R188 Other ascites: Secondary | ICD-10-CM | POA: Diagnosis not present

## 2017-07-30 DIAGNOSIS — C50412 Malignant neoplasm of upper-outer quadrant of left female breast: Secondary | ICD-10-CM

## 2017-07-30 LAB — PROTEIN, PLEURAL OR PERITONEAL FLUID: Total protein, fluid: 3.5 g/dL

## 2017-07-30 LAB — GLUCOSE, PLEURAL OR PERITONEAL FLUID: GLUCOSE FL: 105 mg/dL

## 2017-07-30 LAB — LACTATE DEHYDROGENASE, PLEURAL OR PERITONEAL FLUID: LD, Fluid: 201 U/L — ABNORMAL HIGH (ref 3–23)

## 2017-07-30 MED ORDER — LIDOCAINE HCL 1 % IJ SOLN
INTRAMUSCULAR | Status: DC
Start: 2017-07-30 — End: 2017-07-31
  Filled 2017-07-30: qty 20

## 2017-07-30 NOTE — Procedures (Signed)
Ultrasound-guided diagnostic and therapeutic paracentesis performed yielding 5.2 liters of hazy,amber fluid. No immediate complications. A portion of the fluid was sent to the lab for preordered studies.

## 2017-08-05 ENCOUNTER — Telehealth: Payer: Self-pay | Admitting: Pharmacist

## 2017-08-05 ENCOUNTER — Telehealth: Payer: Self-pay | Admitting: Hematology and Oncology

## 2017-08-05 ENCOUNTER — Inpatient Hospital Stay: Payer: BLUE CROSS/BLUE SHIELD | Admitting: Hematology and Oncology

## 2017-08-05 VITALS — BP 113/73 | HR 96 | Temp 98.6°F | Resp 18 | Ht 68.5 in | Wt 128.5 lb

## 2017-08-05 DIAGNOSIS — Z170 Estrogen receptor positive status [ER+]: Secondary | ICD-10-CM | POA: Diagnosis not present

## 2017-08-05 DIAGNOSIS — Z79899 Other long term (current) drug therapy: Secondary | ICD-10-CM | POA: Diagnosis not present

## 2017-08-05 DIAGNOSIS — Z923 Personal history of irradiation: Secondary | ICD-10-CM | POA: Diagnosis not present

## 2017-08-05 DIAGNOSIS — C50412 Malignant neoplasm of upper-outer quadrant of left female breast: Secondary | ICD-10-CM

## 2017-08-05 DIAGNOSIS — Z90722 Acquired absence of ovaries, bilateral: Secondary | ICD-10-CM | POA: Diagnosis not present

## 2017-08-05 DIAGNOSIS — R188 Other ascites: Secondary | ICD-10-CM

## 2017-08-05 DIAGNOSIS — R140 Abdominal distension (gaseous): Secondary | ICD-10-CM | POA: Diagnosis not present

## 2017-08-05 DIAGNOSIS — Z9012 Acquired absence of left breast and nipple: Secondary | ICD-10-CM

## 2017-08-05 DIAGNOSIS — R180 Malignant ascites: Secondary | ICD-10-CM | POA: Insufficient documentation

## 2017-08-05 DIAGNOSIS — C786 Secondary malignant neoplasm of retroperitoneum and peritoneum: Secondary | ICD-10-CM

## 2017-08-05 DIAGNOSIS — Z90710 Acquired absence of both cervix and uterus: Secondary | ICD-10-CM | POA: Diagnosis not present

## 2017-08-05 DIAGNOSIS — C7951 Secondary malignant neoplasm of bone: Secondary | ICD-10-CM | POA: Diagnosis not present

## 2017-08-05 DIAGNOSIS — C801 Malignant (primary) neoplasm, unspecified: Principal | ICD-10-CM

## 2017-08-05 MED ORDER — PALBOCICLIB 125 MG PO CAPS: 125.0000 mg | ORAL_CAPSULE | Freq: Every day | ORAL | 3 refills | Status: DC

## 2017-08-05 MED ORDER — PALBOCICLIB 125 MG PO CAPS: ORAL_CAPSULE | ORAL | 3 refills | Status: DC

## 2017-08-05 MED ORDER — LETROZOLE 2.5 MG PO TABS: 2.5000 mg | ORAL_TABLET | Freq: Every day | ORAL | 3 refills | Status: AC

## 2017-08-05 MED FILL — IBRANCE 125 MG CAPSULE: 125 | 21 days supply | Qty: 21 | Fill #0

## 2017-08-05 NOTE — Telephone Encounter (Signed)
Gave avs and calendar ° °

## 2017-08-05 NOTE — Telephone Encounter (Signed)
Oral Oncology Pharmacist Encounter  Received new prescription for Ibrance (palbociclib) for the treatment of metastatic, hormone receptor positive breast cancer in conjunction with Femara, planned duration until disease progression or unacceptable toxicity.  Labs from 07/29/2017 assessed, okay for treatment.  Current medication list in Epic reviewed, no DDIs with Ibrance identified.  Prescription has been e-scribed to the [facility] for benefits analysis and approval.  Test claim at the pharmacy revealed that they are not in network with patient's prescriptions benefits. Manufacturer voucher will be used for patient's first fill from the pharmacy.  We will submit for insurance authorization. Once insurance authorization is obtained we will triage prescription to pharmacy that is in network and alert patient as to the dispensing pharmacy.  Oral Oncology Clinic will continue to follow for insurance authorization, copayment issues, initial counseling and start date.  Johny Drilling, PharmD, BCPS, BCOP 08/05/2017 2:56 PM Oral Oncology Clinic [phone]

## 2017-08-05 NOTE — Progress Notes (Signed)
Patient Care Team: [name], MD as PCP - General (Hematology and Oncology) [name], MD as Consulting Physician (Hematology and Oncology)  DIAGNOSIS:  Encounter Diagnoses  Name Primary?  . Malignant ascites   . Peritoneal carcinomatosis ([place]) Yes  . Malignant neoplasm of upper-outer quadrant of left breast in female, estrogen receptor positive ([place])     SUMMARY OF ONCOLOGIC HISTORY:   Breast cancer of upper-outer quadrant of left female breast ([place])   01/18/2002 Initial Diagnosis    Breast cancer of upper-outer quadrant of left female breast; left mastectomy 2/26 lymph nodes positive ER positive received adjuvant TAC chemotherapy followed by radiation and tamoxifen for 9 months stopped for side effects      01/19/2011 Relapse/Recurrence    Right breast augmentation surgery was attempted by Dr. Truman Hayward and was found to have a lesion in the upper outer quadrant 3.3 x 1.8 cm biopsy revealed invasive ductal carcinoma with lobular features ER 100% PR 88% HER-2 negative      02/06/2011 Surgery    Right breast mastectomy at [facility] and axillary lymph node dissection with removal of breast implant followed by immediate reconstruction with tissue expander 36 lymph nodes were positive received radiation therapy and no chemotherapy      03/21/2011 - 04/20/2011 Radiation Therapy    Radiation to chest wall and axilla      05/29/2011 - 12/31/2011 Anti-estrogen oral therapy    Patient received Arimidex for one week and Zoladex x5 months stop Arimidex due to side effects      07/23/2013 Relapse/Recurrence    2 years of back pain and abdominal symptoms with spotting after intercourse revealed uterus enlarged biopsy revealed metastatic breast cancer lobular type: ER positive PR negative      09/18/2013 Surgery    Hysterectomy and bilateral salpingo-oophorectomy at [facility] revealed metastatic breast cancer involving tubes and uterus      10/13/2013 - 10/20/2013 Anti-estrogen oral therapy    Femara  for 30 days stopped for patchy alopecia and patient going for second and third opinions between [place], [place], [place] regarding the appropriate antiestrogen plan (stopped by choice)      02/02/2015 Imaging    Multiple osseous metastases are present. Foci of enhancement are described at C7, T2, T6, T7, T9, and T10      07/29/2017 Imaging    ED visit for abdominal distention: CT abdomen pelvis: Significant ascites with omental caking and scattered areas of peritoneal enhancement/nodularity consistent with peritoneal carcinomatosis, osseous metastases, wall thickening rectosigmoid colon due to serosal implants      07/30/2017 Procedure    Paracentesis: Malignant cells consistent with adenocarcinoma ER positive PR negative GATA3 and GCDFP Positive; consistent with metastatic breast cancer, HER-2 pending       CHIEF COMPLIANT: Follow-up to discuss results of recently performed paracentesis  INTERVAL HISTORY: Cynthia Rosario is a 62-year-old with above-mentioned history of ascites and CT evidence of peritoneal carcinomatosis who is here to discuss the results of the recently performed paracentesis.  She had 5.6 L of fluid taken out and she feels much better.  Pathology came back as metastatic breast cancer that was ER positive and PR negative.  The complete breast prognostic panel is still pending.  She is here to discuss its results and to discuss a treatment plan.  REVIEW OF SYSTEMS:   Constitutional: Denies fevers, chills or abnormal weight loss Eyes: Denies blurriness of vision Ears, nose, mouth, throat, and face: Denies mucositis or sore throat Respiratory: Denies cough, dyspnea or  wheezes Cardiovascular: Denies palpitation, chest discomfort Gastrointestinal:  Denies nausea, heartburn or change in bowel habits Skin: Denies abnormal skin rashes Lymphatics: Denies new lymphadenopathy or easy bruising Neurological:Denies numbness, tingling or new weaknesses Behavioral/Psych: Mood is stable, no  new changes  Extremities: No lower extremity edema  All other systems were reviewed with the patient and are negative.  I have reviewed the past medical history, past surgical history, social history and family history with the patient and they are unchanged from previous note.  ALLERGIES:  has No Known Allergies.  MEDICATIONS:  Current Outpatient Medications  Medication Sig Dispense Refill  . alum & mag hydroxide-simeth (GELUSIL) 476-546-50 MG chewable tablet Chew 3 mLs by mouth every 6 (six) hours as needed for indigestion or heartburn.    . Ascorbic Acid (VITAMIN C PO) Take 1 tablet by mouth daily.    . Aspirin Effervescent (ALKA-SELTZER ORIGINAL PO) Take 2 tablets by mouth daily as needed (upset stomach).    . bisacodyl (DULCOLAX) 10 MG suppository Place 5 mg rectally as needed for moderate constipation.    . Carboxymethylcellulose Sodium (EYE DROPS OP) Place 2 drops into both eyes daily as needed (dry eyes).    Mariane Baumgarten Sodium (COLACE PO) Take 1 tablet by mouth daily as needed (constipation).    Marland Kitchen ibuprofen (ADVIL,MOTRIN) 200 MG tablet Take 400 mg by mouth as needed.     Marland Kitchen letrozole (FEMARA) 2.5 MG tablet Take 1 tablet (2.5 mg total) by mouth daily. 90 tablet 3  . Liniments (SALONPAS PAIN RELIEF PATCH EX) Apply 1 patch topically daily as needed (pain).    . palbociclib (IBRANCE) 125 MG capsule Take 1 capsule (163m) by mouth once daily, mid-morning, with food. Take for 21 days on, 7 days off, repeat every 28 days. 21 capsule 3   No current facility-administered medications for this visit.     PHYSICAL EXAMINATION: ECOG PERFORMANCE STATUS: 1 - Symptomatic but completely ambulatory  Vitals:   08/05/17 1400  BP: 113/73  Pulse: 96  Resp: 18  Temp: 98.6 F (37 C)  SpO2: 98%   Filed Weights   08/05/17 1400  Weight: 128 lb 8 oz (58.3 kg)    GENERAL:alert, no distress and comfortable SKIN: skin color, texture, turgor are normal, no rashes or significant lesions EYES:  normal, Conjunctiva are pink and non-injected, sclera clear OROPHARYNX:no exudate, no erythema and lips, buccal mucosa, and tongue normal  NECK: supple, thyroid normal size, non-tender, without nodularity LYMPH:  no palpable lymphadenopathy in the cervical, axillary or inguinal LUNGS: clear to auscultation and percussion with normal breathing effort HEART: regular rate & rhythm and no murmurs and no lower extremity edema ABDOMEN:abdomen soft, non-tender and normal bowel sounds MUSCULOSKELETAL:no cyanosis of digits and no clubbing  NEURO: alert & oriented x 3 with fluent speech, no focal motor/sensory deficits EXTREMITIES: No lower extremity edema   LABORATORY DATA:  I have reviewed the data as listed CMP Latest Ref Rng & Units 07/29/2017 03/05/2017 09/03/2016  Glucose 65 - 99 mg/dL 101(H) 91 94  BUN 6 - 20 mg/dL 12 17.0 16.6  Creatinine 0.44 - 1.00 mg/dL 0.75 0.8 0.8  Sodium 135 - 145 mmol/L 140 141 142  Potassium 3.5 - 5.1 mmol/L 3.8 3.9 4.3  Chloride 101 - 111 mmol/L 99(L) - -  CO2 22 - 32 mmol/L _0 Calcium 8.9 - 10.3 mg/dL 9.1 9.2 9.9  Total Protein 6.5 - 8.1 g/dL 7.2 6.6 7.3  Total Bilirubin 0.3 - 1.2 mg/dL 0.5  0.38 0.53  Alkaline Phos 38 - 126 U/L 115 94 101  AST 15 - 41 U/L _0 ALT 14 - 54 U/L 8(L) 10 11    Lab Results  Component Value Date   WBC 10.1 07/29/2017   HGB 10.9 (L) 07/29/2017   HCT 34.9 (L) 07/29/2017   MCV 91.8 07/29/2017   PLT 654 (H) 07/29/2017   NEUTROABS 3.9 03/05/2017    ASSESSMENT & PLAN:  Breast cancer of upper-outer quadrant of left female breast Metastatic breast cancer with previous bone metastases and newly diagnosed omental/peritoneal carcinomatosis with massive ascites 07/29/2017 Previously she refused antiestrogen therapy.  07/30/2017: Therapeutic paracentesis: Malignant ascites consistent with adenocarcinoma ER positive PR negative HER-2 pending  Recommendation: Antiestrogen therapy with letrozole along with CDK 4 and 6 inhibitor  like Ibrance.   Ibrance counseling: I discussed the risks and benefits of Ibrance including myelosuppression especially neutropenia and with that risk of infection, there is risk of pulmonary embolism and mild peripheral neuropathy as well. Fatigue, nausea, diarrhea, decreased appetite as well as alopecia and thrombocytopenia are also potential side effects of Ibrance  I discussed with the patient that her prognosis would depend on response to the treatment.  If she responds adequately then she would have a reasonable prognosis.  We talked about different treatment options including chemotherapy. Oral chemotherapy pharmacist Evelena Peat spent some time discussed with her and provided her with the first month samples to begin her treatment.  Return to clinic in 2 weeks for toxicity check    Orders Placed This Encounter  Procedures  . NM PET Image Initial (PI) Skull Base To Thigh    Standing Status:   Future    Standing Expiration Date:   08/05/2018    Order Specific Question:   If indicated for the ordered procedure, I authorize the administration of a radiopharmaceutical per Radiology protocol    Answer:   Yes    Order Specific Question:   Is the patient pregnant?    Answer:   No    Order Specific Question:   Preferred imaging location?    Answer:   [facility]    Order Specific Question:   Radiology Contrast Protocol - do NOT remove file path    Answer:   _1 [email or web address]    Order Specific Question:   Reason for Exam additional comments    Answer:   Metastatic breast cancer   The patient has a good understanding of the overall plan. she agrees with it. she will call with any problems that may develop before the next visit here.   Harriette Ohara, MD 08/05/17

## 2017-08-05 NOTE — Assessment & Plan Note (Addendum)
Metastatic breast cancer with previous bone metastases and newly diagnosed omental/peritoneal carcinomatosis with massive ascites 07/29/2017 Previously she refused antiestrogen therapy.  07/30/2017: Therapeutic paracentesis: Malignant ascites consistent with adenocarcinoma ER positive PR negative HER-2 pending  Recommendation: Antiestrogen therapy with letrozole along with CDK 4 and 6 inhibitor like Ibrance.   Ibrance counseling: I discussed the risks and benefits of Ibrance including myelosuppression especially neutropenia and with that risk of infection, there is risk of pulmonary embolism and mild peripheral neuropathy as well. Fatigue, nausea, diarrhea, decreased appetite as well as alopecia and thrombocytopenia are also potential side effects of Ibrance   I discussed with the patient that her prognosis would depend on response to the treatment.  If she responds adequately then she would have a reasonable prognosis.  We talked about different treatment options including chemotherapy.  Return to clinic in 2 weeks for toxicity check

## 2017-08-06 NOTE — Telephone Encounter (Signed)
Oral Chemotherapy Pharmacist Encounter   I spoke with patient and husband in exam room on 08/05/2017 for overview of: Ibrance.   Counseled patient on administration, dosing, side effects, monitoring, drug-food interactions, safe handling, storage, and disposal.  Patient will take Ibrance 125mg  capsules, 1 capsule by mouth once daily with mid morning snack, for 3 weeks on, 1 week off.  Patient knows to avoid grapefruit and grapefruit juice.  Patient is taking Femara once daily after breakfast.  Ibrance start date: 08/05/2017  Side effects include but not limited to: fatigue, hair loss, GI upset, nausea, decreased blood counts, and increased upper respiratory infections.  Reviewed with patient importance of keeping a medication schedule and plan for any missed doses.  Mr. and Mrs. Boorman voiced understanding and appreciation.   All questions answered. Medication reconciliation performed and medication/allergy list updated.  Patient informed that she is able to go pick up her Leslee Home from the [facility] as soon as we are done with counseling session. Leslee Home will be available to patient on manufacture free voucher, no out-of-pocket expense to patient at this time.  Patient informed that [facility] is out of network for her prescription benefits coverage. Once insurance authorization is obtained prescription will be sent to appropriate specialty pharmacy for dispensing.  Patient knows to call the office with questions or concerns. Oral Oncology Clinic will continue to follow.  Thank you,  Johny Drilling, PharmD, BCPS, BCOP 08/06/2017   10:07 AM Oral Oncology Clinic [phone]

## 2017-08-09 ENCOUNTER — Other Ambulatory Visit: Payer: Self-pay

## 2017-08-09 ENCOUNTER — Telehealth: Payer: Self-pay | Admitting: *Deleted

## 2017-08-09 DIAGNOSIS — C50412 Malignant neoplasm of upper-outer quadrant of left female breast: Secondary | ICD-10-CM

## 2017-08-09 NOTE — Progress Notes (Signed)
Returned pt call regarding need for paracentesis. Unfortunately I have called both Cynthia Rosario and [facility] and there is no availability for today.   Patient stated she could come Monday afternoon.   Called Central Scheduling and have her scheduled at [facility] at [place] patient and informed her of appt on Monday. Informed pt to go to main entrance of [facility] and check in there.   Cyndia Bent RN

## 2017-08-12 ENCOUNTER — Encounter (HOSPITAL_COMMUNITY): Payer: Self-pay | Admitting: Student

## 2017-08-12 ENCOUNTER — Ambulatory Visit (HOSPITAL_COMMUNITY)
Admission: RE | Admit: 2017-08-12 | Discharge: 2017-08-12 | Disposition: A | Discharge status: Home / Self Care | Payer: BLUE CROSS/BLUE SHIELD | Source: Ambulatory Visit | Attending: Medical | Admitting: Medical

## 2017-08-12 DIAGNOSIS — C50412 Malignant neoplasm of upper-outer quadrant of left female breast: Secondary | ICD-10-CM

## 2017-08-12 DIAGNOSIS — R180 Malignant ascites: Secondary | ICD-10-CM | POA: Diagnosis not present

## 2017-08-12 HISTORY — PX: IR PARACENTESIS: IMG2679

## 2017-08-12 MED ORDER — LIDOCAINE HCL (PF) 2 % IJ SOLN
INTRAMUSCULAR | Status: DC | PRN
  Administered 2017-08-12: 10 mL

## 2017-08-12 MED ORDER — LIDOCAINE HCL (PF) 2 % IJ SOLN
INTRAMUSCULAR | Status: AC
  Filled 2017-08-12: qty 20

## 2017-08-12 NOTE — Procedures (Signed)
PROCEDURE SUMMARY:  Successful image-guided paracentesis from the left lower abdomen.  Yielded 3.1 liters of yellow fluid.  No immediate complications.  Patient tolerated well.   Specimen was not sent for labs.  Claris Pong Jaquavion Mccannon PA-C 08/12/2017 2:17 PM

## 2017-08-13 ENCOUNTER — Ambulatory Visit (HOSPITAL_COMMUNITY): Payer: BLUE CROSS/BLUE SHIELD

## 2017-08-16 ENCOUNTER — Encounter (HOSPITAL_COMMUNITY): Payer: Self-pay

## 2017-08-16 ENCOUNTER — Ambulatory Visit (HOSPITAL_COMMUNITY)
Admission: RE | Admit: 2017-08-16 | Discharge: 2017-08-16 | Disposition: A | Discharge status: Home / Self Care | Payer: BLUE CROSS/BLUE SHIELD | Source: Ambulatory Visit | Attending: Hematology and Oncology | Admitting: Hematology and Oncology

## 2017-08-16 DIAGNOSIS — R180 Malignant ascites: Secondary | ICD-10-CM

## 2017-08-16 DIAGNOSIS — C50412 Malignant neoplasm of upper-outer quadrant of left female breast: Secondary | ICD-10-CM

## 2017-08-16 DIAGNOSIS — C801 Malignant (primary) neoplasm, unspecified: Secondary | ICD-10-CM

## 2017-08-16 DIAGNOSIS — C786 Secondary malignant neoplasm of retroperitoneum and peritoneum: Secondary | ICD-10-CM

## 2017-08-16 DIAGNOSIS — Z170 Estrogen receptor positive status [ER+]: Secondary | ICD-10-CM

## 2017-08-21 ENCOUNTER — Telehealth: Payer: Self-pay | Admitting: Pharmacist

## 2017-08-21 ENCOUNTER — Inpatient Hospital Stay: Payer: BLUE CROSS/BLUE SHIELD | Attending: Hematology and Oncology | Admitting: Hematology and Oncology

## 2017-08-21 ENCOUNTER — Inpatient Hospital Stay: Payer: BLUE CROSS/BLUE SHIELD

## 2017-08-21 ENCOUNTER — Other Ambulatory Visit: Payer: Self-pay | Admitting: Pharmacist

## 2017-08-21 VITALS — BP 100/69 | HR 81 | Temp 97.7°F | Resp 18 | Ht 68.5 in | Wt 123.9 lb

## 2017-08-21 DIAGNOSIS — Z9221 Personal history of antineoplastic chemotherapy: Secondary | ICD-10-CM | POA: Diagnosis not present

## 2017-08-21 DIAGNOSIS — Z9012 Acquired absence of left breast and nipple: Secondary | ICD-10-CM | POA: Diagnosis not present

## 2017-08-21 DIAGNOSIS — Z79811 Long term (current) use of aromatase inhibitors: Secondary | ICD-10-CM | POA: Diagnosis not present

## 2017-08-21 DIAGNOSIS — Z170 Estrogen receptor positive status [ER+]: Secondary | ICD-10-CM | POA: Insufficient documentation

## 2017-08-21 DIAGNOSIS — D72819 Decreased white blood cell count, unspecified: Secondary | ICD-10-CM | POA: Insufficient documentation

## 2017-08-21 DIAGNOSIS — C801 Malignant (primary) neoplasm, unspecified: Secondary | ICD-10-CM

## 2017-08-21 DIAGNOSIS — C786 Secondary malignant neoplasm of retroperitoneum and peritoneum: Secondary | ICD-10-CM | POA: Insufficient documentation

## 2017-08-21 DIAGNOSIS — Z923 Personal history of irradiation: Secondary | ICD-10-CM | POA: Insufficient documentation

## 2017-08-21 DIAGNOSIS — C50412 Malignant neoplasm of upper-outer quadrant of left female breast: Secondary | ICD-10-CM

## 2017-08-21 DIAGNOSIS — D649 Anemia, unspecified: Secondary | ICD-10-CM | POA: Diagnosis not present

## 2017-08-21 DIAGNOSIS — C7951 Secondary malignant neoplasm of bone: Secondary | ICD-10-CM | POA: Insufficient documentation

## 2017-08-21 LAB — COMPREHENSIVE METABOLIC PANEL
AST: 11 U/L (ref 5–34)
Albumin: 2 g/dL — ABNORMAL LOW (ref 3.5–5.0)
Alkaline Phosphatase: 99 U/L (ref 40–150)
Anion gap: 8 (ref 3–11)
BUN: 14 mg/dL (ref 7–26)
CHLORIDE: 101 mmol/L (ref 98–109)
CO2: 30 mmol/L — AB (ref 22–29)
CREATININE: 0.78 mg/dL (ref 0.60–1.10)
Calcium: 9 mg/dL (ref 8.4–10.4)
GFR calc Af Amer: >60 mL/min (ref >60)
Glucose, Bld: 117 mg/dL (ref 70–140)
Potassium: 3.7 mmol/L (ref 3.5–5.1)
SODIUM: 139 mmol/L (ref 136–145)
Total Bilirubin: <0.2 mg/dL — ABNORMAL LOW (ref 0.2–1.2)
Total Protein: 6.3 g/dL — ABNORMAL LOW (ref 6.4–8.3)

## 2017-08-21 LAB — CBC WITH DIFFERENTIAL/PLATELET
Basophils Absolute: 0 10*3/uL (ref 0.0–0.1)
Basophils Relative: 1 %
EOS ABS: 0 10*3/uL (ref 0.0–0.5)
Eosinophils Relative: 2 %
HCT: 29.2 % — ABNORMAL LOW (ref 34.8–46.6)
Hemoglobin: 8.9 g/dL — ABNORMAL LOW (ref 11.6–15.9)
LYMPHS ABS: 1.1 10*3/uL (ref 0.9–3.3)
Lymphocytes Relative: 42 %
MCH: 27.1 pg (ref 25.1–34.0)
MCHC: 30.5 g/dL — AB (ref 31.5–36.0)
MCV: 89 fL (ref 79.5–101.0)
MONO ABS: 0 10*3/uL — AB (ref 0.1–0.9)
MONOS PCT: 2 %
Neutro Abs: 1.5 10*3/uL (ref 1.5–6.5)
Neutrophils Relative %: 53 %
PLATELETS: 304 10*3/uL (ref 145–400)
RBC: 3.28 MIL/uL — ABNORMAL LOW (ref 3.70–5.45)
RDW: 15.2 % — ABNORMAL HIGH (ref 11.2–14.5)
WBC: 2.8 10*3/uL — ABNORMAL LOW (ref 3.9–10.3)

## 2017-08-21 MED ORDER — LORAZEPAM 1 MG PO TABS: 1.0000 mg | ORAL_TABLET | Freq: Three times a day (TID) | ORAL | 0 refills | Status: DC | PRN

## 2017-08-21 MED ORDER — PALBOCICLIB 125 MG PO CAPS: ORAL_CAPSULE | ORAL | 3 refills | Status: DC

## 2017-08-21 NOTE — Assessment & Plan Note (Signed)
Metastatic breast cancer with previous bone metastases and newly diagnosed omental/peritoneal carcinomatosis with massive ascites 07/29/2017 Previously she refused antiestrogen therapy.  07/30/2017: Therapeutic paracentesis: Malignant ascites consistent with adenocarcinoma ER positive PR negative HER-2 pending  Current treatment: Antiestrogen therapy with letrozole along with CDK 4 and 6 inhibitor like Ibrance started 08/05/2017  Toxicities:  Return to clinic in 2 weeks for lab check and follow-up

## 2017-08-21 NOTE — Telephone Encounter (Signed)
Oral Oncology Pharmacist Encounter  Patient with office follow-up today. ANC check shows Ibrance dose can stay at 125 mg daily. Per MD and patient, no significant side effects with cycle 1 so far. Original start date: 08/05/2017  Leslee Home prescription E scribed to [facility] per insurance requirement. I have called and updated patient about dispensing pharmacy and provided phone number to pharmacy [phone]).  Patient instructed to call dispensing pharmacy on Monday (08/27/2017) for status update if she has not yet heard from them.  Cycle 2 start date: 09/02/2017  No other needs per patient at this time. Patient expressed understanding and appreciation.  Patient knows to call the office with any additional questions or concerns.  Oral Oncology Clinic will continue to follow.  Johny Drilling, PharmD, BCPS, BCOP 08/21/2017 9:19 AM Oral Oncology Clinic [phone]

## 2017-08-21 NOTE — Progress Notes (Signed)
Patient Care Team: [name], MD as PCP - General (Hematology and Oncology) [name], MD as Consulting Physician (Hematology and Oncology)  DIAGNOSIS:  Encounter Diagnoses  Name Primary?  . Malignant neoplasm of upper-outer quadrant of left breast in female, estrogen receptor positive ([place])   . Peritoneal carcinomatosis ([place]) Yes    SUMMARY OF ONCOLOGIC HISTORY:   Breast cancer of upper-outer quadrant of left female breast ([place])   01/18/2002 Initial Diagnosis    Breast cancer of upper-outer quadrant of left female breast; left mastectomy 2/26 lymph nodes positive ER positive received adjuvant TAC chemotherapy followed by radiation and tamoxifen for 9 months stopped for side effects      01/19/2011 Relapse/Recurrence    Right breast augmentation surgery was attempted by Dr. Truman Hayward and was found to have a lesion in the upper outer quadrant 3.3 x 1.8 cm biopsy revealed invasive ductal carcinoma with lobular features ER 100% PR 88% HER-2 negative      02/06/2011 Surgery    Right breast mastectomy at [facility] and axillary lymph node dissection with removal of breast implant followed by immediate reconstruction with tissue expander 36 lymph nodes were positive received radiation therapy and no chemotherapy      03/21/2011 - 04/20/2011 Radiation Therapy    Radiation to chest wall and axilla      05/29/2011 - 12/31/2011 Anti-estrogen oral therapy    Patient received Arimidex for one week and Zoladex x5 months stop Arimidex due to side effects      07/23/2013 Relapse/Recurrence    2 years of back pain and abdominal symptoms with spotting after intercourse revealed uterus enlarged biopsy revealed metastatic breast cancer lobular type: ER positive PR negative      09/18/2013 Surgery    Hysterectomy and bilateral salpingo-oophorectomy at [facility] revealed metastatic breast cancer involving tubes and uterus      10/13/2013 - 10/20/2013 Anti-estrogen oral therapy    Femara for 30 days stopped for  patchy alopecia and patient going for second and third opinions between [place], [place], [place] regarding the appropriate antiestrogen plan (stopped by choice)      02/02/2015 Imaging    Multiple osseous metastases are present. Foci of enhancement are described at C7, T2, T6, T7, T9, and T10      07/29/2017 Imaging    ED visit for abdominal distention: CT abdomen pelvis: Significant ascites with omental caking and scattered areas of peritoneal enhancement/nodularity consistent with peritoneal carcinomatosis, osseous metastases, wall thickening rectosigmoid colon due to serosal implants      07/30/2017 Procedure    Paracentesis: Malignant cells consistent with adenocarcinoma ER positive PR negative GATA3 and GCDFP Positive; consistent with metastatic breast cancer, HER-2 pending      08/05/2017 -  Anti-estrogen oral therapy    Ibrance with letrozole       CHIEF COMPLIANT: Follow-up on Ibrance with letrozole  INTERVAL HISTORY: Cynthia Rosario is a 62 year old lady with metastatic breast cancer with previous bone metastases and newly diagnosed omental peritoneal carcinomatosis with ascites.  She has been started on Ibrance with letrozole on 08/05/2017 and she is here for a 2-week follow-up to recheck her blood counts and to discuss toxicities.  Overall she appears to be tolerating Ibrance extremely well.  Did not have any nausea vomiting.  She is also tolerating letrozole fairly well. She has been sleeping a lot throughout the day.  REVIEW OF SYSTEMS:   Constitutional: Patient lost 5 pounds since last 2 weeks Eyes: Denies blurriness of vision Ears, nose,  mouth, throat, and face: Denies mucositis or sore throat Respiratory: Denies cough, dyspnea or wheezes Cardiovascular: Denies palpitation, chest discomfort Gastrointestinal: Ascites Skin: Denies abnormal skin rashes Lymphatics: Denies new lymphadenopathy or easy bruising Neurological:Denies numbness, tingling or new  weaknesses Behavioral/Psych: Mood is stable, no new changes  Extremities: No lower extremity edema  All other systems were reviewed with the patient and are negative.  I have reviewed the past medical history, past surgical history, social history and family history with the patient and they are unchanged from previous note.  ALLERGIES:  has No Known Allergies.  MEDICATIONS:  Current Outpatient Medications  Medication Sig Dispense Refill  . alum & mag hydroxide-simeth (GELUSIL) [phone] MG chewable tablet Chew 3 mLs by mouth every 6 (six) hours as needed for indigestion or heartburn.    . Ascorbic Acid (VITAMIN C PO) Take 1 tablet by mouth daily.    . Aspirin Effervescent (ALKA-SELTZER ORIGINAL PO) Take 2 tablets by mouth daily as needed (upset stomach).    . bisacodyl (DULCOLAX) 10 MG suppository Place 5 mg rectally as needed for moderate constipation.    . Carboxymethylcellulose Sodium (EYE DROPS OP) Place 2 drops into both eyes daily as needed (dry eyes).    Mariane Baumgarten Sodium (COLACE PO) Take 1 tablet by mouth daily as needed (constipation).    Marland Kitchen ibuprofen (ADVIL,MOTRIN) 200 MG tablet Take 400 mg by mouth as needed.     Marland Kitchen letrozole (FEMARA) 2.5 MG tablet Take 1 tablet (2.5 mg total) by mouth daily. 90 tablet 3  . Liniments (SALONPAS PAIN RELIEF PATCH EX) Apply 1 patch topically daily as needed (pain).    . LORazepam (ATIVAN) 1 MG tablet Take 1 tablet (1 mg total) by mouth every 8 (eight) hours as needed for anxiety (or nausea). 10 tablet 0  . palbociclib (IBRANCE) 125 MG capsule Take 1 capsule (147m) by mouth once daily, mid-morning, with food. Take for 21 days on, 7 days off, repeat every 28 days. 21 capsule 3   No current facility-administered medications for this visit.     PHYSICAL EXAMINATION: ECOG PERFORMANCE STATUS: 1 - Symptomatic but completely ambulatory  Vitals:   08/21/17 0923  BP: 100/69  Pulse: 81  Resp: 18  Temp: 97.7 F (36.5 C)  SpO2: 100%   Filed  Weights   08/21/17 0923  Weight: 123 lb 14.4 oz (56.2 kg)    GENERAL:alert, no distress and comfortable SKIN: skin color, texture, turgor are normal, no rashes or significant lesions EYES: normal, Conjunctiva are pink and non-injected, sclera clear OROPHARYNX:no exudate, no erythema and lips, buccal mucosa, and tongue normal  NECK: supple, thyroid normal size, non-tender, without nodularity LYMPH:  no palpable lymphadenopathy in the cervical, axillary or inguinal LUNGS: clear to auscultation and percussion with normal breathing effort HEART: regular rate & rhythm and no murmurs and no lower extremity edema ABDOMEN: Moderate ascites MUSCULOSKELETAL:no cyanosis of digits and no clubbing  NEURO: alert & oriented x 3 with fluent speech, no focal motor/sensory deficits EXTREMITIES: No lower extremity edema  LABORATORY DATA:  I have reviewed the data as listed CMP Latest Ref Rng & Units 08/21/2017 07/29/2017 03/05/2017  Glucose 70 - 140 mg/dL 117 101(H) 91  BUN 7 - 26 mg/dL 14 12 17.0  Creatinine 0.60 - 1.10 mg/dL 0.78 0.75 0.8  Sodium 136 - 145 mmol/L 139 140 141  Potassium 3.5 - 5.1 mmol/L 3.7 3.8 3.9  Chloride 98 - 109 mmol/L 101 99(L) -  CO2 22 - 29 mmol/L 30(H)  29 26  Calcium 8.4 - 10.4 mg/dL 9.0 9.1 9.2  Total Protein 6.4 - 8.3 g/dL 6.3(L) 7.2 6.6  Total Bilirubin 0.2 - 1.2 mg/dL <0.2(L) 0.5 0.38  Alkaline Phos 40 - 150 U/L 99 115 94  AST 5 - 34 U/L _0 ALT 0 - 55 U/L <6 8(L) 10    Lab Results  Component Value Date   WBC 2.8 (L) 08/21/2017   HGB 8.9 (L) 08/21/2017   HCT 29.2 (L) 08/21/2017   MCV 89.0 08/21/2017   PLT 304 08/21/2017   NEUTROABS 1.5 08/21/2017    ASSESSMENT & PLAN:  Breast cancer of upper-outer quadrant of left female breast Metastatic breast cancer with previous bone metastases and newly diagnosed omental/peritoneal carcinomatosis with massive ascites 07/29/2017 Previously she refused antiestrogen therapy.  07/30/2017: Therapeutic paracentesis:  Malignant ascites consistent with adenocarcinoma ER positive PR negative HER-2 pending  Current treatment: Antiestrogen therapy with letrozole along with CDK 4 and 6 inhibitor like Ibrance started 08/05/2017  Toxicities: 1.  Anemia: Hemoglobin is 8.9 we will continue to monitor this. 2. leukopenia: ANC 1.5 continue with the same dose  Patient was unable to do PET CT scan.  She will take Ativan to settle down so that she can get the PET/CT done. Return to clinic in 2 weeks for lab check and follow-up   No orders of the defined types were placed in this encounter.  The patient has a good understanding of the overall plan. she agrees with it. she will call with any problems that may develop before the next visit here.   Harriette Ohara, MD 08/21/17

## 2017-08-27 ENCOUNTER — Telehealth: Payer: Self-pay | Admitting: Medical Oncology

## 2017-08-27 ENCOUNTER — Other Ambulatory Visit: Payer: Self-pay

## 2017-08-27 DIAGNOSIS — C801 Malignant (primary) neoplasm, unspecified: Secondary | ICD-10-CM

## 2017-08-27 DIAGNOSIS — C50412 Malignant neoplasm of upper-outer quadrant of left female breast: Secondary | ICD-10-CM

## 2017-08-27 DIAGNOSIS — R180 Malignant ascites: Secondary | ICD-10-CM

## 2017-08-27 DIAGNOSIS — C786 Secondary malignant neoplasm of retroperitoneum and peritoneum: Secondary | ICD-10-CM

## 2017-08-27 NOTE — Telephone Encounter (Signed)
Will call patient and schedule this for her. Thanks

## 2017-08-27 NOTE — Progress Notes (Signed)
Pt called to request for paracentesis to be done at [facility] long. Pt had her last paracentesis at [facility] 08/12/17, but did not like her experience there. She prefers coming back to [facility] in the future.   Pt have been feeling very full around her stomach and getting harder to sleep at night. Pt denies any shortness of breath at this time.   Discussed with Dr.Gudena and okay to schedule paracentesis at [facility]. Called central scheduling and confirmed this with patient. Pt verbalized and confirmed time/date for 08/29/17 at [facility] 0945am.

## 2017-08-27 NOTE — Telephone Encounter (Signed)
Wants paracentesis this week at [facility].

## 2017-08-29 ENCOUNTER — Ambulatory Visit (HOSPITAL_COMMUNITY)
Admission: RE | Admit: 2017-08-29 | Discharge: 2017-08-29 | Disposition: A | Discharge status: Home / Self Care | Payer: BLUE CROSS/BLUE SHIELD | Source: Ambulatory Visit | Attending: Hematology and Oncology | Admitting: Hematology and Oncology

## 2017-08-29 DIAGNOSIS — C801 Malignant (primary) neoplasm, unspecified: Secondary | ICD-10-CM

## 2017-08-29 DIAGNOSIS — R180 Malignant ascites: Secondary | ICD-10-CM | POA: Diagnosis present

## 2017-08-29 DIAGNOSIS — C786 Secondary malignant neoplasm of retroperitoneum and peritoneum: Secondary | ICD-10-CM | POA: Diagnosis present

## 2017-08-29 DIAGNOSIS — C50412 Malignant neoplasm of upper-outer quadrant of left female breast: Secondary | ICD-10-CM | POA: Diagnosis present

## 2017-08-29 MED ORDER — LIDOCAINE HCL 1 % IJ SOLN
INTRAMUSCULAR | Status: AC
  Filled 2017-08-29: qty 20

## 2017-08-29 NOTE — Procedures (Signed)
Ultrasound-guided  therapeutic paracentesis performed yielding 5.9 liters of yellow fluid. No immediate complications.

## 2017-08-30 ENCOUNTER — Other Ambulatory Visit: Payer: Self-pay | Admitting: *Deleted

## 2017-08-30 DIAGNOSIS — Z170 Estrogen receptor positive status [ER+]: Principal | ICD-10-CM

## 2017-08-30 DIAGNOSIS — C50412 Malignant neoplasm of upper-outer quadrant of left female breast: Secondary | ICD-10-CM

## 2017-09-02 ENCOUNTER — Inpatient Hospital Stay (HOSPITAL_BASED_OUTPATIENT_CLINIC_OR_DEPARTMENT_OTHER): Payer: BLUE CROSS/BLUE SHIELD | Admitting: Hematology and Oncology

## 2017-09-02 ENCOUNTER — Inpatient Hospital Stay: Payer: BLUE CROSS/BLUE SHIELD

## 2017-09-02 ENCOUNTER — Telehealth: Payer: Self-pay | Admitting: Hematology and Oncology

## 2017-09-02 DIAGNOSIS — C50412 Malignant neoplasm of upper-outer quadrant of left female breast: Secondary | ICD-10-CM

## 2017-09-02 DIAGNOSIS — Z170 Estrogen receptor positive status [ER+]: Secondary | ICD-10-CM

## 2017-09-02 LAB — CBC WITH DIFFERENTIAL (CANCER CENTER ONLY)
Basophils Absolute: 0.1 10*3/uL (ref 0.0–0.1)
Basophils Relative: 3 %
Eosinophils Absolute: 0 10*3/uL (ref 0.0–0.5)
Eosinophils Relative: 0 %
HEMATOCRIT: 27.5 % — AB (ref 34.8–46.6)
HEMOGLOBIN: 8.5 g/dL — AB (ref 11.6–15.9)
LYMPHS PCT: 64 %
Lymphs Abs: 1.5 10*3/uL (ref 0.9–3.3)
MCH: 28.5 pg (ref 25.1–34.0)
MCHC: 30.9 g/dL — ABNORMAL LOW (ref 31.5–36.0)
MCV: 92.3 fL (ref 79.5–101.0)
Monocytes Absolute: 0.2 10*3/uL (ref 0.1–0.9)
Monocytes Relative: 9 %
NEUTROS PCT: 24 %
Neutro Abs: 0.6 10*3/uL — ABNORMAL LOW (ref 1.5–6.5)
Platelet Count: 398 10*3/uL (ref 145–400)
RBC: 2.98 MIL/uL — AB (ref 3.70–5.45)
RDW: 20.8 % — ABNORMAL HIGH (ref 11.2–14.5)
WBC: 2.3 10*3/uL — AB (ref 3.9–10.3)

## 2017-09-02 LAB — CMP (CANCER CENTER ONLY)
ALT: <6 U/L (ref 0–55)
AST: 13 U/L (ref 5–34)
Albumin: 2.1 g/dL — ABNORMAL LOW (ref 3.5–5.0)
Alkaline Phosphatase: 97 U/L (ref 40–150)
Anion gap: 6 (ref 3–11)
BUN: 14 mg/dL (ref 7–26)
CHLORIDE: 104 mmol/L (ref 98–109)
CO2: 30 mmol/L — ABNORMAL HIGH (ref 22–29)
Calcium: 8.4 mg/dL (ref 8.4–10.4)
Creatinine: 0.74 mg/dL (ref 0.60–1.10)
GFR, Est AFR Am: >60 mL/min (ref >60)
Glucose, Bld: 89 mg/dL (ref 70–140)
POTASSIUM: 4.2 mmol/L (ref 3.5–5.1)
Sodium: 140 mmol/L (ref 136–145)
Total Bilirubin: <0.2 mg/dL — ABNORMAL LOW (ref 0.2–1.2)
Total Protein: 5.5 g/dL — ABNORMAL LOW (ref 6.4–8.3)

## 2017-09-02 MED ORDER — PALBOCICLIB 100 MG PO CAPS: ORAL_CAPSULE | ORAL | 3 refills | Status: DC

## 2017-09-02 NOTE — Progress Notes (Signed)
Patient Care Team: [name], MD as PCP - General (Hematology and Oncology) [name], MD as Consulting Physician (Hematology and Oncology)  DIAGNOSIS:  Encounter Diagnosis  Name Primary?  . Malignant neoplasm of upper-outer quadrant of left breast in female, estrogen receptor positive ([place])     SUMMARY OF ONCOLOGIC HISTORY:   Breast cancer of upper-outer quadrant of left female breast ([place])   01/18/2002 Initial Diagnosis    Breast cancer of upper-outer quadrant of left female breast; left mastectomy 2/26 lymph nodes positive ER positive received adjuvant TAC chemotherapy followed by radiation and tamoxifen for 9 months stopped for side effects      01/19/2011 Relapse/Recurrence    Right breast augmentation surgery was attempted by Dr. Truman Hayward and was found to have a lesion in the upper outer quadrant 3.3 x 1.8 cm biopsy revealed invasive ductal carcinoma with lobular features ER 100% PR 88% HER-2 negative      02/06/2011 Surgery    Right breast mastectomy at [facility] and axillary lymph node dissection with removal of breast implant followed by immediate reconstruction with tissue expander 36 lymph nodes were positive received radiation therapy and no chemotherapy      03/21/2011 - 04/20/2011 Radiation Therapy    Radiation to chest wall and axilla      05/29/2011 - 12/31/2011 Anti-estrogen oral therapy    Patient received Arimidex for one week and Zoladex x5 months stop Arimidex due to side effects      07/23/2013 Relapse/Recurrence    2 years of back pain and abdominal symptoms with spotting after intercourse revealed uterus enlarged biopsy revealed metastatic breast cancer lobular type: ER positive PR negative      09/18/2013 Surgery    Hysterectomy and bilateral salpingo-oophorectomy at [facility] revealed metastatic breast cancer involving tubes and uterus      10/13/2013 - 10/20/2013 Anti-estrogen oral therapy    Femara for 30 days stopped for patchy alopecia and patient going for  second and third opinions between [place], [place], [place] regarding the appropriate antiestrogen plan (stopped by choice)      02/02/2015 Imaging    Multiple osseous metastases are present. Foci of enhancement are described at C7, T2, T6, T7, T9, and T10      07/29/2017 Imaging    ED visit for abdominal distention: CT abdomen pelvis: Significant ascites with omental caking and scattered areas of peritoneal enhancement/nodularity consistent with peritoneal carcinomatosis, osseous metastases, wall thickening rectosigmoid colon due to serosal implants      07/30/2017 Procedure    Paracentesis: Malignant cells consistent with adenocarcinoma ER positive PR negative GATA3 and GCDFP Positive; consistent with metastatic breast cancer, HER-2 pending      08/05/2017 -  Anti-estrogen oral therapy    Ibrance with letrozole       CHIEF COMPLIANT: Follow-up on Ibrance with letrozole  INTERVAL HISTORY: Gera Inboden is a 62 year old with above-mentioned history of metastatic rest cancer currently on Ibrance with letrozole.  She appears to be tolerating Ibrance fairly well with the exception of blood work showing leukopenia and anemia. She has been feeling extremely fatigued.  She has not been doing any activity outdoors. After the paracentesis removing 5.6 L, she has felt much better.  Today she feels remarkably well.  REVIEW OF SYSTEMS:   Constitutional: Denies fevers, chills or abnormal weight loss Eyes: Denies blurriness of vision Ears, nose, mouth, throat, and face: Denies mucositis or sore throat Respiratory: Denies cough, dyspnea or wheezes Cardiovascular: Denies palpitation, chest discomfort Gastrointestinal: Ascites Skin:  Denies abnormal skin rashes Lymphatics: Denies new lymphadenopathy or easy bruising Neurological:Denies numbness, tingling or new weaknesses Behavioral/Psych: Mood is stable, no new changes  Extremities: No lower extremity edema  All other systems were reviewed with the  patient and are negative.  I have reviewed the past medical history, past surgical history, social history and family history with the patient and they are unchanged from previous note.  ALLERGIES:  has No Known Allergies.  MEDICATIONS:  Current Outpatient Medications  Medication Sig Dispense Refill  . alum & mag hydroxide-simeth (GELUSIL) 882-800-34 MG chewable tablet Chew 3 mLs by mouth every 6 (six) hours as needed for indigestion or heartburn.    . Ascorbic Acid (VITAMIN C PO) Take 1 tablet by mouth daily.    . Aspirin Effervescent (ALKA-SELTZER ORIGINAL PO) Take 2 tablets by mouth daily as needed (upset stomach).    . bisacodyl (DULCOLAX) 10 MG suppository Place 5 mg rectally as needed for moderate constipation.    . Carboxymethylcellulose Sodium (EYE DROPS OP) Place 2 drops into both eyes daily as needed (dry eyes).    Mariane Baumgarten Sodium (COLACE PO) Take 1 tablet by mouth daily as needed (constipation).    Marland Kitchen ibuprofen (ADVIL,MOTRIN) 200 MG tablet Take 400 mg by mouth as needed.     Marland Kitchen letrozole (FEMARA) 2.5 MG tablet Take 1 tablet (2.5 mg total) by mouth daily. 90 tablet 3  . Liniments (SALONPAS PAIN RELIEF PATCH EX) Apply 1 patch topically daily as needed (pain).    . LORazepam (ATIVAN) 1 MG tablet Take 1 tablet (1 mg total) by mouth every 8 (eight) hours as needed for anxiety (or nausea). 10 tablet 0  . [START ON 09/09/2017] palbociclib (IBRANCE) 100 MG capsule Take 1 capsule (174m) by mouth once daily, mid-morning, with food. Take for 21 days on, 7 days off, repeat every 28 days. 21 capsule 3   No current facility-administered medications for this visit.     PHYSICAL EXAMINATION: ECOG PERFORMANCE STATUS: 1 - Symptomatic but completely ambulatory  Vitals:   09/02/17 1514  BP: 102/69  Pulse: 83  Resp: 17  Temp: 98.1 F (36.7 C)  SpO2: 100%   Filed Weights   09/02/17 1514  Weight: 118 lb 11.2 oz (53.8 kg)    GENERAL:alert, no distress and comfortable SKIN: skin color,  texture, turgor are normal, no rashes or significant lesions EYES: normal, Conjunctiva are pink and non-injected, sclera clear OROPHARYNX:no exudate, no erythema and lips, buccal mucosa, and tongue normal  NECK: supple, thyroid normal size, non-tender, without nodularity LYMPH:  no palpable lymphadenopathy in the cervical, axillary or inguinal LUNGS: clear to auscultation and percussion with normal breathing effort HEART: regular rate & rhythm and no murmurs and no lower extremity edema ABDOMEN:abdomen soft, non-tender and normal bowel sounds MUSCULOSKELETAL:no cyanosis of digits and no clubbing  NEURO: alert & oriented x 3 with fluent speech, no focal motor/sensory deficits EXTREMITIES: No lower extremity edema  LABORATORY DATA:  I have reviewed the data as listed CMP Latest Ref Rng & Units 09/02/2017 08/21/2017 07/29/2017  Glucose 70 - 140 mg/dL 89 117 101(H)  BUN 7 - 26 mg/dL _0 Creatinine 0.60 - 1.10 mg/dL 0.74 0.78 0.75  Sodium 136 - 145 mmol/L 140 139 140  Potassium 3.5 - 5.1 mmol/L 4.2 3.7 3.8  Chloride 98 - 109 mmol/L 104 101 99(L)  CO2 22 - 29 mmol/L 30(H) 30(H) 29  Calcium 8.4 - 10.4 mg/dL 8.4 9.0 9.1  Total Protein 6.4 -  8.3 g/dL 5.5(L) 6.3(L) 7.2  Total Bilirubin 0.2 - 1.2 mg/dL <0.2(L) <0.2(L) 0.5  Alkaline Phos 40 - 150 U/L 97 99 115  AST 5 - 34 U/L _0 ALT 0 - 55 U/L <6 <6 8(L)    Lab Results  Component Value Date   WBC 2.3 (L) 09/02/2017   HGB 8.5 (L) 09/02/2017   HCT 27.5 (L) 09/02/2017   MCV 92.3 09/02/2017   PLT 398 09/02/2017   NEUTROABS 0.6 (L) 09/02/2017    ASSESSMENT & PLAN:  Breast cancer of upper-outer quadrant of left female breast Metastatic breast cancer with previous bone metastases and newly diagnosed omental/peritoneal carcinomatosis with massive ascites 07/29/2017 Previously she refused antiestrogen therapy.  07/30/2017: Therapeutic paracentesis: Malignant ascites consistent with adenocarcinoma ER positive PR negative HER-2  pending  Current treatment: Antiestrogen therapywith letrozolealong with CDK 4 and 6 inhibitor like Ibrance started 08/05/2017  Toxicities: 1.  Anemia: Hemoglobin is 8.5 we will continue to monitor this. 2. leukopenia: ANC 0.6.  I discussed with her that we would need to reduce the dosage of Ibrance 200 mg daily.  She already received the next month supply of Ibrance.  I will send a new prescription to your pharmacy to send 100 mg dosage.  She will begin this on 09/09/2017.   Patient was unable to do PET CT scan. I will see her back in 3 weeks for follow-up.    No orders of the defined types were placed in this encounter.  The patient has a good understanding of the overall plan. she agrees with it. she will call with any problems that may develop before the next visit here.   Harriette Ohara, MD 09/02/17

## 2017-09-02 NOTE — Telephone Encounter (Signed)
Gave pt avs and calendar with appts per 5/13 los.  °

## 2017-09-02 NOTE — Assessment & Plan Note (Signed)
Metastatic breast cancer with previous bone metastases and newly diagnosed omental/peritoneal carcinomatosis with massive ascites 07/29/2017 Previously she refused antiestrogen therapy.  07/30/2017: Therapeutic paracentesis: Malignant ascites consistent with adenocarcinoma ER positive PR negative HER-2 pending  Current treatment: Antiestrogen therapywith letrozolealong with CDK 4 and 6 inhibitor like Ibrance started 08/05/2017  Toxicities: 1.  Anemia: Hemoglobin is 8.9 we will continue to monitor this. 2. leukopenia: ANC 1.5 continue with the same dose  Patient was unable to do PET CT scan.  She will take Ativan to settle down so that she can get the PET/CT done. Return to clinic in 2 weeks for lab check and follow-up

## 2017-09-05 ENCOUNTER — Inpatient Hospital Stay (HOSPITAL_COMMUNITY)
Admission: EM | Admit: 2017-09-05 | Discharge: 2017-09-07 | DRG: 388 | Disposition: A | Discharge status: Home / Self Care | Payer: BLUE CROSS/BLUE SHIELD | Attending: Internal Medicine | Admitting: Internal Medicine

## 2017-09-05 ENCOUNTER — Other Ambulatory Visit: Payer: Self-pay

## 2017-09-05 ENCOUNTER — Encounter (HOSPITAL_COMMUNITY): Payer: Self-pay | Admitting: Nurse Practitioner

## 2017-09-05 ENCOUNTER — Emergency Department (HOSPITAL_COMMUNITY): Payer: BLUE CROSS/BLUE SHIELD

## 2017-09-05 DIAGNOSIS — M8580 Other specified disorders of bone density and structure, unspecified site: Secondary | ICD-10-CM | POA: Diagnosis present

## 2017-09-05 DIAGNOSIS — K56600 Partial intestinal obstruction, unspecified as to cause: Secondary | ICD-10-CM | POA: Diagnosis present

## 2017-09-05 DIAGNOSIS — Z79899 Other long term (current) drug therapy: Secondary | ICD-10-CM

## 2017-09-05 DIAGNOSIS — C7951 Secondary malignant neoplasm of bone: Secondary | ICD-10-CM | POA: Diagnosis present

## 2017-09-05 DIAGNOSIS — Z170 Estrogen receptor positive status [ER+]: Secondary | ICD-10-CM | POA: Diagnosis not present

## 2017-09-05 DIAGNOSIS — N1330 Unspecified hydronephrosis: Secondary | ICD-10-CM | POA: Diagnosis present

## 2017-09-05 DIAGNOSIS — E43 Unspecified severe protein-calorie malnutrition: Secondary | ICD-10-CM | POA: Diagnosis present

## 2017-09-05 DIAGNOSIS — C50412 Malignant neoplasm of upper-outer quadrant of left female breast: Secondary | ICD-10-CM | POA: Diagnosis present

## 2017-09-05 DIAGNOSIS — Z90710 Acquired absence of both cervix and uterus: Secondary | ICD-10-CM

## 2017-09-05 DIAGNOSIS — Z87442 Personal history of urinary calculi: Secondary | ICD-10-CM

## 2017-09-05 DIAGNOSIS — C801 Malignant (primary) neoplasm, unspecified: Secondary | ICD-10-CM | POA: Diagnosis not present

## 2017-09-05 DIAGNOSIS — Z681 Body mass index (BMI) 19 or less, adult: Secondary | ICD-10-CM

## 2017-09-05 DIAGNOSIS — R180 Malignant ascites: Secondary | ICD-10-CM | POA: Diagnosis present

## 2017-09-05 DIAGNOSIS — C786 Secondary malignant neoplasm of retroperitoneum and peritoneum: Secondary | ICD-10-CM | POA: Diagnosis present

## 2017-09-05 DIAGNOSIS — K5909 Other constipation: Secondary | ICD-10-CM | POA: Diagnosis present

## 2017-09-05 DIAGNOSIS — Z86010 Personal history of colonic polyps: Secondary | ICD-10-CM | POA: Diagnosis not present

## 2017-09-05 DIAGNOSIS — C762 Malignant neoplasm of abdomen: Secondary | ICD-10-CM | POA: Diagnosis not present

## 2017-09-05 DIAGNOSIS — K56609 Unspecified intestinal obstruction, unspecified as to partial versus complete obstruction: Secondary | ICD-10-CM | POA: Diagnosis present

## 2017-09-05 DIAGNOSIS — R110 Nausea: Secondary | ICD-10-CM | POA: Diagnosis not present

## 2017-09-05 LAB — COMPREHENSIVE METABOLIC PANEL
ALK PHOS: 100 U/L (ref 38–126)
ALT: 8 U/L — ABNORMAL LOW (ref 14–54)
ANION GAP: 12 (ref 5–15)
AST: 17 U/L (ref 15–41)
Albumin: 2.6 g/dL — ABNORMAL LOW (ref 3.5–5.0)
BILIRUBIN TOTAL: 0.3 mg/dL (ref 0.3–1.2)
BUN: 15 mg/dL (ref 6–20)
CO2: 22 mmol/L (ref 22–32)
Calcium: 8.8 mg/dL — ABNORMAL LOW (ref 8.9–10.3)
Chloride: 105 mmol/L (ref 101–111)
Creatinine, Ser: 0.65 mg/dL (ref 0.44–1.00)
GFR calc Af Amer: >60 mL/min (ref >60)
GLUCOSE: 127 mg/dL — AB (ref 65–99)
POTASSIUM: 4.3 mmol/L (ref 3.5–5.1)
Sodium: 139 mmol/L (ref 135–145)
TOTAL PROTEIN: 6.1 g/dL — AB (ref 6.5–8.1)

## 2017-09-05 LAB — CBC WITH DIFFERENTIAL/PLATELET
BASOS ABS: 0.1 10*3/uL (ref 0.0–0.1)
Basophils Relative: 2 %
Eosinophils Absolute: 0 10*3/uL (ref 0.0–0.7)
Eosinophils Relative: 0 %
HEMATOCRIT: 30.6 % — AB (ref 36.0–46.0)
HEMOGLOBIN: 9.5 g/dL — AB (ref 12.0–15.0)
LYMPHS ABS: 1.2 10*3/uL (ref 0.7–4.0)
LYMPHS PCT: 29 %
MCH: 29.1 pg (ref 26.0–34.0)
MCHC: 31 g/dL (ref 30.0–36.0)
MCV: 93.6 fL (ref 78.0–100.0)
MONOS PCT: 12 %
Monocytes Absolute: 0.5 10*3/uL (ref 0.1–1.0)
NEUTROS ABS: 2.5 10*3/uL (ref 1.7–7.7)
Neutrophils Relative %: 57 %
Platelets: 521 10*3/uL — ABNORMAL HIGH (ref 150–400)
RBC: 3.27 MIL/uL — AB (ref 3.87–5.11)
RDW: 23.4 % — AB (ref 11.5–15.5)
WBC: 4.3 10*3/uL (ref 4.0–10.5)

## 2017-09-05 LAB — I-STAT CG4 LACTIC ACID, ED: Lactic Acid, Venous: 1.01 mmol/L (ref 0.5–1.9)

## 2017-09-05 LAB — LIPASE, BLOOD: LIPASE: 33 U/L (ref 11–51)

## 2017-09-05 MED ORDER — LETROZOLE 2.5 MG PO TABS
2.5000 mg | ORAL_TABLET | Freq: Every day | ORAL | Status: DC
  Administered 2017-09-07: 2.5 mg via ORAL
  Filled 2017-09-05 (×2): qty 1

## 2017-09-05 MED ORDER — ACETAMINOPHEN 650 MG RE SUPP: 650.0000 mg | Freq: Four times a day (QID) | RECTAL | Status: DC | PRN

## 2017-09-05 MED ORDER — LORAZEPAM 2 MG/ML IJ SOLN: 0.5000 mg | Freq: Four times a day (QID) | INTRAMUSCULAR | Status: DC | PRN

## 2017-09-05 MED ORDER — ONDANSETRON HCL 4 MG/2ML IJ SOLN
4.0000 mg | Freq: Once | INTRAMUSCULAR | Status: AC
  Administered 2017-09-05: 4 mg via INTRAVENOUS
  Filled 2017-09-05: qty 2

## 2017-09-05 MED ORDER — SODIUM CHLORIDE 0.9 % IV BOLUS
1000.0000 mL | Freq: Once | INTRAVENOUS | Status: AC
  Administered 2017-09-05: 1000 mL via INTRAVENOUS

## 2017-09-05 MED ORDER — ONDANSETRON HCL 4 MG/2ML IJ SOLN
4.0000 mg | Freq: Four times a day (QID) | INTRAMUSCULAR | Status: DC | PRN
  Administered 2017-09-05 – 2017-09-06 (×2): 4 mg via INTRAVENOUS
  Filled 2017-09-05 (×3): qty 2

## 2017-09-05 MED ORDER — KETOROLAC TROMETHAMINE 15 MG/ML IJ SOLN
15.0000 mg | Freq: Four times a day (QID) | INTRAMUSCULAR | Status: DC | PRN
  Administered 2017-09-06: 15 mg via INTRAVENOUS
  Filled 2017-09-05: qty 2

## 2017-09-05 MED ORDER — PROMETHAZINE HCL 25 MG/ML IJ SOLN
12.5000 mg | Freq: Once | INTRAMUSCULAR | Status: AC
Start: 2017-09-05 — End: 2017-09-05
  Administered 2017-09-05: 12.5 mg via INTRAVENOUS
  Filled 2017-09-05 (×2): qty 1

## 2017-09-05 MED ORDER — ACETAMINOPHEN 325 MG PO TABS
650.0000 mg | ORAL_TABLET | Freq: Four times a day (QID) | ORAL | Status: DC | PRN
  Administered 2017-09-05 – 2017-09-06 (×2): 650 mg via ORAL
  Filled 2017-09-05 (×2): qty 2

## 2017-09-05 MED ORDER — IBUPROFEN 200 MG PO TABS: 400.0000 mg | ORAL_TABLET | Freq: Four times a day (QID) | ORAL | Status: DC | PRN

## 2017-09-05 MED ORDER — ONDANSETRON HCL 4 MG PO TABS: 4.0000 mg | ORAL_TABLET | Freq: Four times a day (QID) | ORAL | Status: DC | PRN

## 2017-09-05 MED ORDER — ENOXAPARIN SODIUM 40 MG/0.4ML ~~LOC~~ SOLN
40.0000 mg | SUBCUTANEOUS | Status: DC
  Administered 2017-09-05: 40 mg via SUBCUTANEOUS
  Filled 2017-09-05 (×2): qty 0.4

## 2017-09-05 MED ORDER — IOHEXOL 300 MG/ML  SOLN
30.0000 mL | Freq: Once | INTRAMUSCULAR | Status: AC | PRN
  Administered 2017-09-05: 30 mL via ORAL

## 2017-09-05 MED ORDER — LORAZEPAM 1 MG PO TABS: 1.0000 mg | ORAL_TABLET | Freq: Three times a day (TID) | ORAL | Status: DC | PRN

## 2017-09-05 MED ORDER — MORPHINE SULFATE (PF) 2 MG/ML IV SOLN: 2.0000 mg | INTRAVENOUS | Status: DC | PRN

## 2017-09-05 MED ORDER — IOPAMIDOL (ISOVUE-300) INJECTION 61%
100.0000 mL | Freq: Once | INTRAVENOUS | Status: AC | PRN
  Administered 2017-09-05: 100 mL via INTRAVENOUS

## 2017-09-05 MED ORDER — IOPAMIDOL (ISOVUE-300) INJECTION 61%
INTRAVENOUS | Status: AC
  Filled 2017-09-05: qty 100

## 2017-09-05 MED ORDER — SODIUM CHLORIDE 0.9 % IV SOLN
INTRAVENOUS | Status: DC
  Administered 2017-09-05 – 2017-09-07 (×3): via INTRAVENOUS

## 2017-09-05 NOTE — ED Triage Notes (Signed)
Patient here for sudden onset abdominal pain and a headache and back pain. Patient had chills but denies fever. Patient has stage 4 breast cancer. Taking oral chemo.

## 2017-09-05 NOTE — ED Provider Notes (Addendum)
[facility] DEPT Provider Note   CSN: [id number] Arrival date & time: 09/05/17  1445     History   Chief Complaint No chief complaint on file.   HPI Cynthia Rosario is a 62 y.o. female.  HPI Patient presents with headache and abdominal pain.  Has stage IV breast cancer including into her abdomen.  Has paracentesis around every 2 weeks and last had it 1 week ago.  They took off her on 5 L last time.  This morning she developed a severe headache.  It is in the front of her head.  Has had nausea also.  Has a history of migraines but this 1 feels different.  Has had chills.  States she wishes she could just throw up. Also has abdominal pain.  Diffuse.  States she feels that she would throw up and feel better.  States that she had been passing gas having bowel movements but that stopped earlier today.  History of carcinomatosis. Past Medical History:  Diagnosis Date  . Abnormal pap   . Anemia   . Breast cancer ([place])   . Cervical polyp   . Chronic headaches   . Dermoid   . Fibroid   . Kidney stones   . Osteopenia   . Ovarian cyst   . Paresthesia 04/07/2015  . Urinary urgency     Patient Active Problem List   Diagnosis Date Noted  . Malignant ascites 08/05/2017  . Peritoneal carcinomatosis ([place]) 07/29/2017  . Special screening for malignant neoplasms, colon 05/02/2017  . Incomplete passage of stool 05/02/2017  . Paresthesia 04/07/2015  . Breast cancer of upper-outer quadrant of left female breast ([place])   . Ovarian cyst   . Osteopenia   . Cervical polyp   . Fibroid   . Kidney stones     Past Surgical History:  Procedure Laterality Date  . ABDOMINAL HYSTERECTOMY  09/18/13   robotic hyst, bso for metastatic breast cancer, Dr Alycia Rossetti at [facility].  Marland Kitchen BREAST ENHANCEMENT SURGERY    . BREAST LUMPECTOMY     x2  . IR PARACENTESIS  08/12/2017  . MASTECTOMY Bilateral   . RECONSTRUCTION BREAST W/ TRAM FLAP    . WISDOM TOOTH EXTRACTION       OB History     Gravida  3   Para  2   Term      Preterm      AB  1   Living  2     SAB  1   TAB      Ectopic      Multiple      Live Births               Home Medications    Prior to Admission medications   Medication Sig Start Date End Date Taking? Authorizing Provider  Aspirin Effervescent (ALKA-SELTZER ORIGINAL PO) Take 2 tablets by mouth daily as needed (upset stomach).   Yes [provider]  ibuprofen (ADVIL,MOTRIN) 200 MG tablet Take 400 mg by mouth every 6 (six) hours as needed for moderate pain.    Yes [provider]  letrozole (FEMARA) 2.5 MG tablet Take 1 tablet (2.5 mg total) by mouth daily. 08/05/17  Yes [name], MD  Liniments (SALONPAS PAIN RELIEF PATCH EX) Apply 1 patch topically daily as needed (pain).   Yes [provider]  alum & mag hydroxide-simeth (GELUSIL) 200-200-25 MG chewable tablet Chew 3 mLs by mouth every 6 (six) hours as needed for indigestion or  heartburn.    [provider]  LORazepam (ATIVAN) 1 MG tablet Take 1 tablet (1 mg total) by mouth every 8 (eight) hours as needed for anxiety (or nausea). 08/21/17   [name], MD  palbociclib Leslee Home) 100 MG capsule Take 1 capsule (125mg ) by mouth once daily, mid-morning, with food. Take for 21 days on, 7 days off, repeat every 28 days. Patient not taking: Reported on 09/05/2017 09/09/17   [name], MD    Family History Family History  Problem Relation Age of Onset  . Heart disease Father   . Hypertension Mother   . Stomach cancer Paternal Grandmother     Social History Social History   Tobacco Use  . Smoking status: Former Smoker    Last attempt to quit: 1983    Years since quitting: 36.3  . Smokeless tobacco: Never Used  Substance Use Topics  . Alcohol use: No  . Drug use: No     Allergies   Patient has no known allergies.   Review of Systems Review of Systems  Constitutional: Positive for appetite change and chills.  HENT: Negative for  congestion.   Respiratory: Negative for shortness of breath.   Cardiovascular: Negative for chest pain.  Gastrointestinal: Positive for abdominal pain, constipation, nausea and vomiting.  Musculoskeletal: Negative for back pain.  Skin: Negative for rash.  Neurological: Positive for headaches.  Hematological: Negative for adenopathy.  Psychiatric/Behavioral: Negative for confusion.     Physical Exam Updated Vital Signs BP 119/74   Pulse 85   Temp 97.8 F (36.6 C) (Oral)   Ht 5' 8.5" (1.74 m)   Wt 53.5 kg (118 lb)   SpO2 99%   BMI 17.68 kg/m   Physical Exam  Constitutional: She appears well-developed.  Patient appears uncomfortable  HENT:  Head: Atraumatic.  Eyes: Pupils are equal, round, and reactive to light.  Cardiovascular: Normal rate.  Pulmonary/Chest: No respiratory distress.  Abdominal: There is tenderness.  Musculoskeletal: She exhibits no edema.  Neurological: She is alert.     ED Treatments / Results  Labs (all labs ordered are listed, but only abnormal results are displayed) Labs Reviewed  COMPREHENSIVE METABOLIC PANEL - Abnormal; Notable for the following components:      Result Value   Glucose, Bld 127 (*)    Calcium 8.8 (*)    Total Protein 6.1 (*)    Albumin 2.6 (*)    ALT 8 (*)    All other components within normal limits  CBC WITH DIFFERENTIAL/PLATELET - Abnormal; Notable for the following components:   RBC 3.27 (*)    Hemoglobin 9.5 (*)    HCT 30.6 (*)    RDW 23.4 (*)    Platelets 521 (*)    All other components within normal limits  LIPASE, BLOOD  URINALYSIS, ROUTINE W REFLEX MICROSCOPIC  I-STAT CG4 LACTIC ACID, ED    EKG None  Radiology Ct Head Wo Contrast  Result Date: 09/05/2017 CLINICAL DATA:  Headache stage IV breast cancer EXAM: CT HEAD WITHOUT CONTRAST TECHNIQUE: Contiguous axial images were obtained from the base of the skull through the vertex without intravenous contrast. COMPARISON:  Cervical MRI 02/02/2015 FINDINGS:  Brain: No acute territorial infarction, hemorrhage or intracranial mass. The ventricles are nonenlarged. Vascular: No hyperdense vessels.  Carotid vascular calcification Skull: Small scattered foci of sclerosis in the calvarium. No fracture Sinuses/Orbits: Mild mucosal thickening in the ethmoid sinuses. No acute orbital abnormality Other: None IMPRESSION: 1. No CT evidence for acute intracranial abnormality. 2. Small  scattered sclerotic foci in the calvarium, possible metastatic foci given findings on cervical MRI Electronically Signed   By: Donavan Foil M.D.   On: 09/05/2017 19:02   Ct Abdomen Pelvis W Contrast  Result Date: 09/05/2017 CLINICAL DATA:  Sudden onset of abdominal pain and headache. Back pain. EXAM: CT ABDOMEN AND PELVIS WITH CONTRAST TECHNIQUE: Multidetector CT imaging of the abdomen and pelvis was performed using the standard protocol following bolus administration of intravenous contrast. CONTRAST:  129mL ISOVUE-300 IOPAMIDOL (ISOVUE-300) INJECTION 61%, 66mL OMNIPAQUE IOHEXOL 300 MG/ML SOLN COMPARISON:  07/29/2017 FINDINGS: Lower chest: Left lower lobe atelectasis. Bilateral breast implants, post mastectomy. Hepatobiliary: Stable subcapsular cyst in the left lobe of the liver measuring 11 mm. Normal appearance of the gallbladder. Pancreas: Unremarkable. No pancreatic ductal dilatation or surrounding inflammatory changes. Spleen: Normal in size without focal abnormality. Adrenals/Urinary Tract: Normal adrenal glands. Interval development of bilateral hydronephrosis, mild on the right and moderate on the left. No obstructing calculi. Scattered renal cysts. Stomach/Bowel: Circumferential mucosal thickening of the stomach in the gastric body and antrum. Interval development of dilation of small bowel loops in most of the abdomen, with transitional point in the left lower quadrant of the abdomen, image 50/105, sequence 2. Maximum diameter of dilated small bowel loops 3.5 cm. Circumferential wall  thickening of the decompressed small bowel, immediately downstream to the transitional point. Persistent circumferential wall thickening of the rectosigmoid colon. Vascular/Lymphatic: Large volume ascites with linear and nodular thickening of the peritoneum, omental caking and hyperenhancement of the peritoneum, consistent with peritoneal carcinomatosis, not significantly changed. Reproductive: Status post hysterectomy. No adnexal masses. Other: No abdominal wall hernia. Musculoskeletal: Multiple predominantly lytic metastatic lesions throughout the thoracic and lumbar vertebral bodies, bilateral pelvis, lateral eighth rib are stable. No evidence of pathologic fracture. IMPRESSION: Interval development of early or incomplete/intermittent small bowel obstruction with transitional point in the left lower quadrant of the abdomen. Circumferential thickening of the decompressed small bowels distal to the transitional point suggests serosal metastatic deposits versus segmental enteritis. Interval development of bilateral hydronephrosis, mild on the right and moderate on the left. Stable findings of peritoneal carcinomatosis and large volume ascites. Serosal metastatic deposits versus segmental colitis causing thickening of the rectosigmoid colon. Similar appearance of the gastric body and antrum. Stable appearance of multifocal lytic skeletal metastatic disease. These results were called by telephone at the time of interpretation on 09/05/2017 at 7:19 pm to Dr. Davonna Belling , who verbally acknowledged these results. Electronically Signed   By: Fidela Salisbury M.D.   On: 09/05/2017 19:24    Procedures Procedures (including critical care time)  Medications Ordered in ED Medications  iopamidol (ISOVUE-300) 61 % injection (has no administration in time range)  ondansetron (ZOFRAN) injection 4 mg (4 mg Intravenous Given 09/05/17 1633)  sodium chloride 0.9 % bolus 1,000 mL (0 mLs Intravenous Stopped 09/05/17  1749)  iohexol (OMNIPAQUE) 300 MG/ML solution 30 mL (30 mLs Oral Contrast Given 09/05/17 1820)  promethazine (PHENERGAN) injection 12.5 mg (12.5 mg Intravenous Given 09/05/17 1808)  iopamidol (ISOVUE-300) 61 % injection 100 mL (100 mLs Intravenous Contrast Given 09/05/17 1820)     Initial Impression / Assessment and Plan / ED Course  I have reviewed the triage vital signs and the nursing notes.  Pertinent labs & imaging results that were available during my care of the patient were reviewed by me and considered in my medical decision making (see chart for details).    Patient with nausea vomiting and headache with known metastatic cancer.  Head CT does not show intracranial mets.  However the CT of the abdomen shows partial small bowel obstruction likely from the tumor.  Also mild hydronephrosis without change in her kidney function.  She is somewhat sensitive to medications Zofran had only partially helped the nausea.  12.5 Phenergan given and help with the nausea which she is a little more confused now.  Reportedly  urinated into the drawer in the bathroom.  Will admit to internal medicine.  Patient does have ascites but with no fever and no systemic white count and another reason for the abdominal pain I think SBP is less likely.  Final Clinical Impressions(s) / ED Diagnoses   Final diagnoses:  Partial intestinal obstruction, unspecified cause ([place])  Abdominal carcinomatosis [facility])    ED Discharge Orders    None       Davonna Belling, MD 09/05/17 1950    Davonna Belling, MD 09/05/17 [phone]

## 2017-09-05 NOTE — ED Notes (Signed)
Unable to obtain temp in triage. RN aware.

## 2017-09-05 NOTE — Progress Notes (Signed)
ED TO INPATIENT HANDOFF REPORT  Name/Age/Gender Cynthia Rosario 62 y.o. female  Code Status    Code Status Orders  (From admission, onward)        Start     Ordered   09/05/17 1959  Full code  Continuous     09/05/17 1959    Code Status History    This patient has a current code status but no historical code status.      Home/SNF/Other Home  Chief Complaint abdominal pains,chills  Level of Care/Admitting Diagnosis ED Disposition    ED Disposition Condition [facility] Area: [place] [[id number]]  Level of Care: Med-Surg [16]  Diagnosis: SBO (small bowel obstruction) [facility]) [[id number]]  Admitting Physician: Doreatha Massed  Attending Physician: Etta Quill [phone]  Estimated length of stay: past midnight tomorrow  Certification:: I certify this patient will need inpatient services for at least 2 midnights  PT Class (Do Not Modify): Inpatient [101]  PT Acc Code (Do Not Modify): Private [1]       Medical History Past Medical History:  Diagnosis Date  . Abnormal pap   . Anemia   . Breast cancer ([place])   . Cervical polyp   . Chronic headaches   . Dermoid   . Fibroid   . Kidney stones   . Osteopenia   . Ovarian cyst   . Paresthesia 04/07/2015  . Urinary urgency     Allergies No Known Allergies  IV Location/Drains/Wounds Patient Lines/Drains/Airways Status   Active Line/Drains/Airways    Name:   Placement date:   Placement time:   Site:   Days:   Peripheral IV 09/05/17 Right Antecubital   09/05/17    1627    Antecubital   less than 1          Labs/Imaging Results for orders placed or performed during the hospital encounter of 09/05/17 (from the past 48 hour(s))  Comprehensive metabolic panel     Status: Abnormal   Collection Time: 09/05/17  3:26 PM  Result Value Ref Range   Sodium 139 135 - 145 mmol/L   Potassium 4.3 3.5 - 5.1 mmol/L   Chloride 105 101 - 111 mmol/L   CO2 22 22 - 32 mmol/L   Glucose,  Bld 127 (H) 65 - 99 mg/dL   BUN 15 6 - 20 mg/dL   Creatinine, Ser 0.65 0.44 - 1.00 mg/dL   Calcium 8.8 (L) 8.9 - 10.3 mg/dL   Total Protein 6.1 (L) 6.5 - 8.1 g/dL   Albumin 2.6 (L) 3.5 - 5.0 g/dL   AST 17 15 - 41 U/L   ALT 8 (L) 14 - 54 U/L   Alkaline Phosphatase 100 38 - 126 U/L   Total Bilirubin 0.3 0.3 - 1.2 mg/dL   GFR calc non Af Amer >60 >60 mL/min   GFR calc Af Amer >60 >60 mL/min    Comment: (NOTE) The eGFR has been calculated using the CKD EPI equation. This calculation has not been validated in all clinical situations. eGFR's persistently <60 mL/min signify possible Chronic Kidney Disease.    Anion gap 12 5 - 15    Comment: Performed at [facility], [place] [address] [place], [place] [address]  CBC with Differential     Status: Abnormal   Collection Time: 09/05/17  3:26 PM  Result Value Ref Range   WBC 4.3 4.0 - 10.5 K/uL   RBC 3.27 (L) 3.87 - 5.11 MIL/uL  Hemoglobin 9.5 (L) 12.0 - 15.0 g/dL   HCT 30.6 (L) 36.0 - 46.0 %   MCV 93.6 78.0 - 100.0 fL   MCH 29.1 26.0 - 34.0 pg   MCHC 31.0 30.0 - 36.0 g/dL   RDW 23.4 (H) 11.5 - 15.5 %   Platelets 521 (H) 150 - 400 K/uL   Neutrophils Relative % 57 %   Lymphocytes Relative 29 %   Monocytes Relative 12 %   Eosinophils Relative 0 %   Basophils Relative 2 %   Neutro Abs 2.5 1.7 - 7.7 K/uL   Lymphs Abs 1.2 0.7 - 4.0 K/uL   Monocytes Absolute 0.5 0.1 - 1.0 K/uL   Eosinophils Absolute 0.0 0.0 - 0.7 K/uL   Basophils Absolute 0.1 0.0 - 0.1 K/uL   RBC Morphology POLYCHROMASIA PRESENT     Comment: TARGET CELLS ELLIPTOCYTES Schistocytes present Performed at [facility], [place] [address], [place], [place] [address]   Lipase, blood     Status: None   Collection Time: 09/05/17  3:26 PM  Result Value Ref Range   Lipase 33 11 - 51 U/L    Comment: Performed at [facility], [place] [address], [place], [place] [address]  I-Stat CG4 Lactic Acid, ED     Status: None   Collection  Time: 09/05/17  4:32 PM  Result Value Ref Range   Lactic Acid, Venous 1.01 0.5 - 1.9 mmol/L   Ct Head Wo Contrast  Result Date: 09/05/2017 CLINICAL DATA:  Headache stage IV breast cancer EXAM: CT HEAD WITHOUT CONTRAST TECHNIQUE: Contiguous axial images were obtained from the base of the skull through the vertex without intravenous contrast. COMPARISON:  Cervical MRI 02/02/2015 FINDINGS: Brain: No acute territorial infarction, hemorrhage or intracranial mass. The ventricles are nonenlarged. Vascular: No hyperdense vessels.  Carotid vascular calcification Skull: Small scattered foci of sclerosis in the calvarium. No fracture Sinuses/Orbits: Mild mucosal thickening in the ethmoid sinuses. No acute orbital abnormality Other: None IMPRESSION: 1. No CT evidence for acute intracranial abnormality. 2. Small scattered sclerotic foci in the calvarium, possible metastatic foci given findings on cervical MRI Electronically Signed   By: Donavan Foil M.D.   On: 09/05/2017 19:02   Ct Abdomen Pelvis W Contrast  Result Date: 09/05/2017 CLINICAL DATA:  Sudden onset of abdominal pain and headache. Back pain. EXAM: CT ABDOMEN AND PELVIS WITH CONTRAST TECHNIQUE: Multidetector CT imaging of the abdomen and pelvis was performed using the standard protocol following bolus administration of intravenous contrast. CONTRAST:  168m ISOVUE-300 IOPAMIDOL (ISOVUE-300) INJECTION 61%, 373mOMNIPAQUE IOHEXOL 300 MG/ML SOLN COMPARISON:  07/29/2017 FINDINGS: Lower chest: Left lower lobe atelectasis. Bilateral breast implants, post mastectomy. Hepatobiliary: Stable subcapsular cyst in the left lobe of the liver measuring 11 mm. Normal appearance of the gallbladder. Pancreas: Unremarkable. No pancreatic ductal dilatation or surrounding inflammatory changes. Spleen: Normal in size without focal abnormality. Adrenals/Urinary Tract: Normal adrenal glands. Interval development of bilateral hydronephrosis, mild on the right and moderate on the  left. No obstructing calculi. Scattered renal cysts. Stomach/Bowel: Circumferential mucosal thickening of the stomach in the gastric body and antrum. Interval development of dilation of small bowel loops in most of the abdomen, with transitional point in the left lower quadrant of the abdomen, image 50/105, sequence 2. Maximum diameter of dilated small bowel loops 3.5 cm. Circumferential wall thickening of the decompressed small bowel, immediately downstream to the transitional point. Persistent circumferential wall thickening of the rectosigmoid colon. Vascular/Lymphatic: Large volume ascites with linear and  nodular thickening of the peritoneum, omental caking and hyperenhancement of the peritoneum, consistent with peritoneal carcinomatosis, not significantly changed. Reproductive: Status post hysterectomy. No adnexal masses. Other: No abdominal wall hernia. Musculoskeletal: Multiple predominantly lytic metastatic lesions throughout the thoracic and lumbar vertebral bodies, bilateral pelvis, lateral eighth rib are stable. No evidence of pathologic fracture. IMPRESSION: Interval development of early or incomplete/intermittent small bowel obstruction with transitional point in the left lower quadrant of the abdomen. Circumferential thickening of the decompressed small bowels distal to the transitional point suggests serosal metastatic deposits versus segmental enteritis. Interval development of bilateral hydronephrosis, mild on the right and moderate on the left. Stable findings of peritoneal carcinomatosis and large volume ascites. Serosal metastatic deposits versus segmental colitis causing thickening of the rectosigmoid colon. Similar appearance of the gastric body and antrum. Stable appearance of multifocal lytic skeletal metastatic disease. These results were called by telephone at the time of interpretation on 09/05/2017 at 7:19 pm to Dr. Davonna Belling , who verbally acknowledged these results. Electronically  Signed   By: Fidela Salisbury M.D.   On: 09/05/2017 19:24    Pending Labs Unresulted Labs (From admission, onward)   Start     Ordered   09/06/17 0500  CBC  Tomorrow morning,   R     09/05/17 1959   09/06/17 5056  Basic metabolic panel  Tomorrow morning,   R     09/05/17 1959   09/05/17 1959  HIV antibody (Routine Testing)  Once,   R     09/05/17 1959   09/05/17 1526  Urinalysis, Routine w reflex microscopic  STAT,   STAT     09/05/17 1525      Vitals/Pain Today's Vitals   09/05/17 1800 09/05/17 1901 09/05/17 1930 09/05/17 2000  BP: 117/74 (!) 103/57 119/74 101/61  Pulse: 81 83 85 92  Temp:      TempSrc:      SpO2: 100% 98% 99% 96%  Weight:      Height:      PainSc:        Isolation Precautions No active isolations  Medications Medications  iopamidol (ISOVUE-300) 61 % injection (has no administration in time range)  acetaminophen (TYLENOL) tablet 650 mg (has no administration in time range)    Or  acetaminophen (TYLENOL) suppository 650 mg (has no administration in time range)  ondansetron (ZOFRAN) tablet 4 mg (has no administration in time range)    Or  ondansetron (ZOFRAN) injection 4 mg (has no administration in time range)  enoxaparin (LOVENOX) injection 40 mg (has no administration in time range)  0.9 %  sodium chloride infusion (has no administration in time range)  letrozole [facility]) tablet 2.5 mg (has no administration in time range)  morphine 2 MG/ML injection 2-4 mg (has no administration in time range)  ketorolac (TORADOL) 15 MG/ML injection 15-30 mg (has no administration in time range)  LORazepam (ATIVAN) injection 0.5-1 mg (has no administration in time range)  ondansetron (ZOFRAN) injection 4 mg (4 mg Intravenous Given 09/05/17 1633)  sodium chloride 0.9 % bolus 1,000 mL (0 mLs Intravenous Stopped 09/05/17 1749)  iohexol (OMNIPAQUE) 300 MG/ML solution 30 mL (30 mLs Oral Contrast Given 09/05/17 1820)  promethazine (PHENERGAN) injection 12.5 mg (12.5 mg  Intravenous Given 09/05/17 1808)  iopamidol (ISOVUE-300) 61 % injection 100 mL (100 mLs Intravenous Contrast Given 09/05/17 1820)    Mobility walks

## 2017-09-05 NOTE — H&P (Signed)
History and Physical    Cynthia Rosario YIR:[id number] DOB: [birth year] DOA: 09/05/2017  PCP: [name], MD  Patient coming from: Home  I have personally briefly reviewed patient's old medical records in [place]  Chief Complaint: Abd pain  HPI: Cynthia Rosario is a 62 y.o. 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  This AM developed severe headache, nausea, abd pain.  Stopped passing gas or BMs earlier today.   ED Course: Emesis x1 in ED.  CT head is neg, CT abd/pelvis shows early SBO vs PSBO due to possible serosal mets, also shows large volume ascites from known carcinomatosis, also shows moderate L, mild R hydronephrosis.  Still making urine and no change to kidney function on BMP.   Review of Systems: As per HPI otherwise 10 point review of systems negative.   Past Medical History:  Diagnosis Date  . Abnormal pap   . Anemia   . Breast cancer ([place])   . Cervical polyp   . Chronic headaches   . Dermoid   . Fibroid   . Kidney stones   . Osteopenia   . Ovarian cyst   . Paresthesia 04/07/2015  . Urinary urgency     Past Surgical History:  Procedure Laterality Date  . ABDOMINAL HYSTERECTOMY  09/18/13   robotic hyst, bso for metastatic breast cancer, Dr Alycia Rossetti at [facility].  Marland Kitchen BREAST ENHANCEMENT SURGERY    . BREAST LUMPECTOMY     x2  . IR PARACENTESIS  08/12/2017  . MASTECTOMY Bilateral   . RECONSTRUCTION BREAST W/ TRAM FLAP    . WISDOM TOOTH EXTRACTION       reports that she quit smoking about 36 years ago. She has never used smokeless tobacco. She reports that she does not drink alcohol or use drugs.  No Known Allergies  Family History  Problem Relation Age of Onset  . Heart disease Father   . Hypertension Mother   . Stomach cancer Paternal Grandmother      Prior to Admission medications   Medication Sig Start Date End Date  Taking? Authorizing Provider  Aspirin Effervescent (ALKA-SELTZER ORIGINAL PO) Take 2 tablets by mouth daily as needed (upset stomach).   Yes [provider]  ibuprofen (ADVIL,MOTRIN) 200 MG tablet Take 400 mg by mouth every 6 (six) hours as needed for moderate pain.    Yes [provider]  letrozole (FEMARA) 2.5 MG tablet Take 1 tablet (2.5 mg total) by mouth daily. 08/05/17  Yes [name], MD  Liniments (SALONPAS PAIN RELIEF PATCH EX) Apply 1 patch topically daily as needed (pain).   Yes [provider]  alum & mag hydroxide-simeth (GELUSIL) 200-200-25 MG chewable tablet Chew 3 mLs by mouth every 6 (six) hours as needed for indigestion or heartburn.    [provider]  LORazepam (ATIVAN) 1 MG tablet Take 1 tablet (1 mg total) by mouth every 8 (eight) hours as needed for anxiety (or nausea). 08/21/17   [name], MD  palbociclib (IBRANCE) 100 MG capsule Take 1 capsule (150m) by mouth once daily, mid-morning, with food. Take for 21 days on, 7 days off, repeat every 28 days. Patient not taking: Reported on 09/05/2017 09/09/17   GNicholas Lose MD    Physical Exam: Vitals:   09/05/17 1730 09/05/17 1800 09/05/17 1901 09/05/17 1930  BP: 111/73 117/74 (!) 103/57 119/74  Pulse: 81 81 83 85  Temp:  TempSrc:      SpO2: 99% 100% 98% 99%  Weight:      Height:        Constitutional: NAD, calm, comfortable Eyes: PERRL, lids and conjunctivae normal ENMT: Mucous membranes are moist. Posterior pharynx clear of any exudate or lesions.Normal dentition.  Neck: normal, supple, no masses, no thyromegaly Respiratory: clear to auscultation bilaterally, no wheezing, no crackles. Normal respiratory effort. No accessory muscle use.  Cardiovascular: Regular rate and rhythm, no murmurs / rubs / gallops. No extremity edema. 2+ pedal pulses. No carotid bruits.  Abdomen: Distended, TTP, ascites. Musculoskeletal: no clubbing / cyanosis. No joint deformity upper and lower  extremities. Good ROM, no contractures. Normal muscle tone.  Skin: no rashes, lesions, ulcers. No induration Neurologic: CN 2-12 grossly intact. Sensation intact, DTR normal. Strength 5/5 in all 4.  Psychiatric: Normal judgment and insight. Alert and oriented x 3. Normal mood.    Labs on Admission: I have personally reviewed following labs and imaging studies  CBC: Recent Labs  Lab 09/02/17 1442 09/05/17 1526  WBC 2.3* 4.3  NEUTROABS 0.6* 2.5  HGB 8.5* 9.5*  HCT 27.5* 30.6*  MCV 92.3 93.6  PLT 398 517*   Basic Metabolic Panel: Recent Labs  Lab 09/02/17 1442 09/05/17 1526  NA 140 139  K 4.2 4.3  CL 104 105  CO2 30* 22  GLUCOSE 89 127*  BUN 14 15  CREATININE 0.74 0.65  CALCIUM 8.4 8.8*   GFR: Estimated Creatinine Clearance: 63.2 mL/min (by C-G formula based on SCr of 0.65 mg/dL). Liver Function Tests: Recent Labs  Lab 09/02/17 1442 09/05/17 1526  AST 13 17  ALT <6 8*  ALKPHOS 97 100  BILITOT <0.2* 0.3  PROT 5.5* 6.1*  ALBUMIN 2.1* 2.6*   Recent Labs  Lab 09/05/17 1526  LIPASE 33   No results for input(s): AMMONIA in the last 168 hours. Coagulation Profile: No results for input(s): INR, PROTIME in the last 168 hours. Cardiac Enzymes: No results for input(s): CKTOTAL, CKMB, CKMBINDEX, TROPONINI in the last 168 hours. BNP (last 3 results) No results for input(s): PROBNP in the last 8760 hours. HbA1C: No results for input(s): HGBA1C in the last 72 hours. CBG: No results for input(s): GLUCAP in the last 168 hours. Lipid Profile: No results for input(s): CHOL, HDL, LDLCALC, TRIG, CHOLHDL, LDLDIRECT in the last 72 hours. Thyroid Function Tests: No results for input(s): TSH, T4TOTAL, FREET4, T3FREE, THYROIDAB in the last 72 hours. Anemia Panel: No results for input(s): VITAMINB12, FOLATE, FERRITIN, TIBC, IRON, RETICCTPCT in the last 72 hours. Urine analysis:    Component Value Date/Time   COLORURINE AMBER (A) 07/29/2017 0947   APPEARANCEUR CLEAR  07/29/2017 0947   LABSPEC 1.033 (H) 07/29/2017 [place] 5.0 07/29/2017 [place] 07/29/2017 0947   HGBUR NEGATIVE 07/29/2017 0947   BILIRUBINUR SMALL (A) 07/29/2017 0947   BILIRUBINUR neg 01/29/2012 0952   KETONESUR 5 (A) 07/29/2017 0947   PROTEINUR 30 (A) 07/29/2017 0947   UROBILINOGEN negative 01/29/2012 0952   UROBILINOGEN 0.2 04/07/2010 2251   NITRITE NEGATIVE 07/29/2017 0947   LEUKOCYTESUR TRACE (A) 07/29/2017 0947    Radiological Exams on Admission: Ct Head Wo Contrast  Result Date: 09/05/2017 CLINICAL DATA:  Headache stage IV breast cancer EXAM: CT HEAD WITHOUT CONTRAST TECHNIQUE: Contiguous axial images were obtained from the base of the skull through the vertex without intravenous contrast. COMPARISON:  Cervical MRI 02/02/2015 FINDINGS: Brain: No acute territorial infarction, hemorrhage or intracranial mass. The  ventricles are nonenlarged. Vascular: No hyperdense vessels.  Carotid vascular calcification Skull: Small scattered foci of sclerosis in the calvarium. No fracture Sinuses/Orbits: Mild mucosal thickening in the ethmoid sinuses. No acute orbital abnormality Other: None IMPRESSION: 1. No CT evidence for acute intracranial abnormality. 2. Small scattered sclerotic foci in the calvarium, possible metastatic foci given findings on cervical MRI Electronically Signed   By: Donavan Foil M.D.   On: 09/05/2017 19:02   Ct Abdomen Pelvis W Contrast  Result Date: 09/05/2017 CLINICAL DATA:  Sudden onset of abdominal pain and headache. Back pain. EXAM: CT ABDOMEN AND PELVIS WITH CONTRAST TECHNIQUE: Multidetector CT imaging of the abdomen and pelvis was performed using the standard protocol following bolus administration of intravenous contrast. CONTRAST:  149m ISOVUE-300 IOPAMIDOL (ISOVUE-300) INJECTION 61%, 354mOMNIPAQUE IOHEXOL 300 MG/ML SOLN COMPARISON:  07/29/2017 FINDINGS: Lower chest: Left lower lobe atelectasis. Bilateral breast implants, post mastectomy.  Hepatobiliary: Stable subcapsular cyst in the left lobe of the liver measuring 11 mm. Normal appearance of the gallbladder. Pancreas: Unremarkable. No pancreatic ductal dilatation or surrounding inflammatory changes. Spleen: Normal in size without focal abnormality. Adrenals/Urinary Tract: Normal adrenal glands. Interval development of bilateral hydronephrosis, mild on the right and moderate on the left. No obstructing calculi. Scattered renal cysts. Stomach/Bowel: Circumferential mucosal thickening of the stomach in the gastric body and antrum. Interval development of dilation of small bowel loops in most of the abdomen, with transitional point in the left lower quadrant of the abdomen, image 50/105, sequence 2. Maximum diameter of dilated small bowel loops 3.5 cm. Circumferential wall thickening of the decompressed small bowel, immediately downstream to the transitional point. Persistent circumferential wall thickening of the rectosigmoid colon. Vascular/Lymphatic: Large volume ascites with linear and nodular thickening of the peritoneum, omental caking and hyperenhancement of the peritoneum, consistent with peritoneal carcinomatosis, not significantly changed. Reproductive: Status post hysterectomy. No adnexal masses. Other: No abdominal wall hernia. Musculoskeletal: Multiple predominantly lytic metastatic lesions throughout the thoracic and lumbar vertebral bodies, bilateral pelvis, lateral eighth rib are stable. No evidence of pathologic fracture. IMPRESSION: Interval development of early or incomplete/intermittent small bowel obstruction with transitional point in the left lower quadrant of the abdomen. Circumferential thickening of the decompressed small bowels distal to the transitional point suggests serosal metastatic deposits versus segmental enteritis. Interval development of bilateral hydronephrosis, mild on the right and moderate on the left. Stable findings of peritoneal carcinomatosis and large  volume ascites. Serosal metastatic deposits versus segmental colitis causing thickening of the rectosigmoid colon. Similar appearance of the gastric body and antrum. Stable appearance of multifocal lytic skeletal metastatic disease. These results were called by telephone at the time of interpretation on 09/05/2017 at 7:19 pm to Dr. NADavonna Belling who verbally acknowledged these results. Electronically Signed   By: DoFidela Salisbury.D.   On: 09/05/2017 19:24    EKG: Independently reviewed.  Assessment/Plan Principal Problem:   SBO (small bowel obstruction) ([facility]) Active Problems:   Breast cancer of upper-outer quadrant of left female breast ([facility]  Peritoneal carcinomatosis ([facility] [place]  Malignant ascites   Bilateral hydronephrosis    1. SBO / PSBO - 1. NPO 2. IVF: NS at 75 cc/hr 3. zofran PRN 4. If abd pain / vomiting worsens then place NGT, patient preferred to hold off for now. 5. Call gen surg in AM. 2. B hydro - 1. Kidney function appears nl on BMP thus far 2. UA pending 3. Repeat BMP in AM 4. Call urology in AM for routine consult (asuming  UA neg for UTI). 3. BRCA with peritoneal carcinomatosis - 1. IP consult to oncology placed in epic 2. IP consult to pal care placed in epic 3. [facility] is currently on hold until the 20th due to recent neutropenia (see office note). 4. Continue Femera 5. No SIRS, no peritoneal findings, doubt SBP at this time  DVT prophylaxis: Lovenox Code Status: Full Family Communication: Husband at bedside Disposition Plan: Home after admit Consults called: Consults put into Epic for onc and pal care. Admission status: Admit to inpatient   [place], [place] [facility] Pager [phone]  If 7AM-7PM, please contact day team taking care of patient [email or web address] Password [facility]  09/05/2017, 8:28 PM

## 2017-09-06 ENCOUNTER — Inpatient Hospital Stay (HOSPITAL_COMMUNITY): Payer: BLUE CROSS/BLUE SHIELD

## 2017-09-06 DIAGNOSIS — C762 Malignant neoplasm of abdomen: Secondary | ICD-10-CM

## 2017-09-06 DIAGNOSIS — K56609 Unspecified intestinal obstruction, unspecified as to partial versus complete obstruction: Secondary | ICD-10-CM

## 2017-09-06 DIAGNOSIS — Z170 Estrogen receptor positive status [ER+]: Secondary | ICD-10-CM

## 2017-09-06 DIAGNOSIS — C50412 Malignant neoplasm of upper-outer quadrant of left female breast: Secondary | ICD-10-CM

## 2017-09-06 LAB — BASIC METABOLIC PANEL
ANION GAP: 8 (ref 5–15)
BUN: 15 mg/dL (ref 6–20)
CALCIUM: 8.7 mg/dL — AB (ref 8.9–10.3)
CO2: 26 mmol/L (ref 22–32)
Chloride: 106 mmol/L (ref 101–111)
Creatinine, Ser: 0.69 mg/dL (ref 0.44–1.00)
GFR calc Af Amer: >60 mL/min (ref >60)
GFR calc non Af Amer: >60 mL/min (ref >60)
Glucose, Bld: 93 mg/dL (ref 65–99)
POTASSIUM: 4 mmol/L (ref 3.5–5.1)
SODIUM: 140 mmol/L (ref 135–145)

## 2017-09-06 LAB — CBC
HCT: 28.2 % — ABNORMAL LOW (ref 36.0–46.0)
HEMOGLOBIN: 8.8 g/dL — AB (ref 12.0–15.0)
MCH: 28.9 pg (ref 26.0–34.0)
MCHC: 31.2 g/dL (ref 30.0–36.0)
MCV: 92.5 fL (ref 78.0–100.0)
Platelets: 542 10*3/uL — ABNORMAL HIGH (ref 150–400)
RBC: 3.05 MIL/uL — ABNORMAL LOW (ref 3.87–5.11)
RDW: 23.4 % — AB (ref 11.5–15.5)
WBC: 3.4 10*3/uL — ABNORMAL LOW (ref 4.0–10.5)

## 2017-09-06 LAB — URINALYSIS, ROUTINE W REFLEX MICROSCOPIC
Bilirubin Urine: NEGATIVE
Glucose, UA: NEGATIVE mg/dL
Hgb urine dipstick: NEGATIVE
KETONES UR: 5 mg/dL — AB
LEUKOCYTES UA: NEGATIVE
NITRITE: NEGATIVE
PH: 5 (ref 5.0–8.0)
Protein, ur: NEGATIVE mg/dL
Specific Gravity, Urine: >1.046 — ABNORMAL HIGH (ref 1.005–1.030)

## 2017-09-06 LAB — HIV ANTIBODY (ROUTINE TESTING W REFLEX): HIV SCREEN 4TH GENERATION: NONREACTIVE

## 2017-09-06 MED ORDER — METOCLOPRAMIDE HCL 5 MG/ML IJ SOLN
10.0000 mg | Freq: Four times a day (QID) | INTRAMUSCULAR | Status: DC | PRN
  Administered 2017-09-06: 10 mg via INTRAVENOUS
  Filled 2017-09-06: qty 2

## 2017-09-06 MED ORDER — POLYETHYLENE GLYCOL 3350 17 G PO PACK
17.0000 g | PACK | Freq: Every day | ORAL | Status: DC
  Administered 2017-09-06 – 2017-09-07 (×2): 17 g via ORAL
  Filled 2017-09-06 (×4): qty 1

## 2017-09-06 MED ORDER — PROMETHAZINE HCL 25 MG/ML IJ SOLN
6.2500 mg | Freq: Four times a day (QID) | INTRAMUSCULAR | Status: DC | PRN
  Administered 2017-09-06: 6.25 mg via INTRAVENOUS
  Filled 2017-09-06: qty 1

## 2017-09-06 MED ORDER — DOCUSATE SODIUM 100 MG PO CAPS
100.0000 mg | ORAL_CAPSULE | Freq: Two times a day (BID) | ORAL | Status: DC
  Administered 2017-09-06 – 2017-09-07 (×2): 100 mg via ORAL
  Filled 2017-09-06 (×2): qty 1

## 2017-09-06 NOTE — Progress Notes (Signed)
Initial Nutrition Assessment  DOCUMENTATION CODES:   Severe malnutrition in context of chronic illness  INTERVENTION:  RD to monitor diet advancement and add supplements as necessary.  Pt would benefit from high calorie/protein diet on follow-up.   NUTRITION DIAGNOSIS:   Severe Malnutrition related to cancer and cancer related treatments as evidenced by severe fat depletion, severe muscle depletion.  GOAL:   Patient will meet greater than or equal to 90% of their needs, Weight gain  MONITOR:   Diet advancement, Labs, Weight trends, I & O's, Skin  REASON FOR ASSESSMENT:   Malnutrition Screening Tool    ASSESSMENT:   62 y.o. 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 PMH of ovarian cysts, urinary urgency, osteopenia, cervical polyp, fibroid, kidney stones, paresthesia, anemia, and chronic headaches.   Pt with PSBO and subsequent NPO. Per MD note, pt does not need a NGT at this time.   Pt awake in bed with husband at bedside; pt reports being tired from the medications as well as nauseous - she has not had emesis since yesterday. Pt reports that she lost a lot of weight over the past year reporting a usual weight of around 135 lbs and is now 118 lbs; 13% weight loss in 1 year - not significant. Pt also reports that she has muscle wasting and has been trying to gain the weight back.   Pt reports eating a good amount of food typically and will eat bran flakes for breakfast, "a well rounded meal with vegetables" for lunch, and "things like spaghetti" for dinner. Pt reports eating a good amount of food at each meal; over two fistfuls at each meal.  Pt reports that sometimes she will eat a lot of food at one meal to try and gain weight and believes that may be why she is in the hospital; discussed with pt eating small frequent meals as this will help to increase  amount of food taken in. Also discussed adding higher calorie things to foods she is already eating like peanut butter or butter. Encouraged pt to consider a supplement to help meet her kcal and protein needs. Pt reports not eating much meat; asked pt if she eats other things high in protein such as beans, dairy, eggs, or nuts - pt reports eating some eggs.   Pt needs more education on follow up for increasing kcal and protein intake; pt nauseous at time of dietetic intern visit and didn't want to continue to talk about food.    Medications reviewed: colace, miralax, femara, NaCl 0.9% @ 75 ml/hr, reglan, zofran, phenergan.   Labs reviewed: WBC 3.4 (L), RBC 3.05 (L), hemoglobin 8.8 (L), HCT 28.2 (L).   NUTRITION - FOCUSED PHYSICAL EXAM:    Most Recent Value  Orbital Region  Moderate depletion  Upper Arm Region  Severe depletion  Thoracic and Lumbar Region  Severe depletion  Buccal Region  Moderate depletion  Temple Region  Moderate depletion  Clavicle Bone Region  Moderate depletion  Clavicle and Acromion Bone Region  Moderate depletion  Scapular Bone Region  Unable to assess  Dorsal Hand  Mild depletion  Patellar Region  Severe depletion  Anterior Thigh Region  Severe depletion  Posterior Calf Region  Moderate depletion  Edema (RD Assessment)  None  Hair  Reviewed (Thinning)  Eyes  Reviewed  Mouth  Unable to assess  Skin  Reviewed  Nails  Reviewed  Diet Order:   Diet Order           Diet NPO time specified Except for: Sips with Meds, Ice Chips, Other (See Comments)  Diet effective now          EDUCATION NEEDS:   Not appropriate for education at this time  Skin:  Skin Assessment: Reviewed RN Assessment  Last BM:  09/04/17  Height:   Ht Readings from Last 1 Encounters:  09/05/17 5' 8.5" (1.74 m)    Weight:   Wt Readings from Last 1 Encounters:  09/05/17 118 lb (53.5 kg)   UBW: 125-130 lbs - weight changes likely due to fluid fluctuations; routine  paracentesis.  %UBW: 94%  Ideal Body Weight:  64.77 kg  BMI:  Body mass index is 17.68 kg/m.  Estimated Nutritional Needs:   Kcal:  1800- 2000 kcal  Protein:  80-90 grams  Fluid:  1L + UOP   Hope Budds, Dietetic Intern

## 2017-09-06 NOTE — Consult Note (Addendum)
[facility] Surgery Consult Note  Cynthia Rosario 12-29-1955  [id number].    Requesting MD: [place] Crane  Chief Complaint/Reason for Consult: PSBO HPI:  Patient is a 62 year old female with BRCA metastatic breast cancer. She presented to [facility] with abdominal pain, nausea and headache since yesterday AM. Known peritoneal carcinomatosis and malignant ascites, therapeutic paracentesis every other week. Last paracentesis was one week ago. Last BM was Wednesday and was a small one. Is currently passing some flatus but no BM. Currently reports some nausea but has not thrown up since in the ED. Emesis was NBNB. Patient denies chest pain, SOB, diarrhea, bloody or tarry stools, urinary symptoms. Does report some low grade fevers at night recently, but states that was improved this past week. Was staying fairly regular with bowel function with use of occasional laxatives at home but ate a lot of food on Wednesday because she is trying to gain weight.   Past abdominal procedures: abdominal hysterectomy and tram procedure. No home anticoagulation.   ROS: Review of Systems  Constitutional: Positive for fever. Negative for chills.  Respiratory: Negative for shortness of breath.   Cardiovascular: Negative for chest pain and palpitations.  Gastrointestinal: Positive for abdominal pain, nausea and vomiting. Negative for blood in stool, constipation, diarrhea and melena.  Genitourinary: Negative for dysuria, frequency and urgency.  All other systems reviewed and are negative.   Family History  Problem Relation Age of Onset  . Heart disease Father   . Hypertension Mother   . Stomach cancer Paternal Grandmother     Past Medical History:  Diagnosis Date  . Abnormal pap   . Anemia   . Breast cancer ([place])   . Cervical polyp   . Chronic headaches   . Dermoid   . Fibroid   . Kidney stones   . Osteopenia   . Ovarian cyst   . Paresthesia 04/07/2015  . Urinary urgency     Past Surgical History:   Procedure Laterality Date  . ABDOMINAL HYSTERECTOMY  09/18/13   robotic hyst, bso for metastatic breast cancer, Dr Alycia Rossetti at [facility].  Marland Kitchen BREAST ENHANCEMENT SURGERY    . BREAST LUMPECTOMY     x2  . IR PARACENTESIS  08/12/2017  . MASTECTOMY Bilateral   . RECONSTRUCTION BREAST W/ TRAM FLAP    . WISDOM TOOTH EXTRACTION      Social History:  reports that she quit smoking about 36 years ago. She has never used smokeless tobacco. She reports that she does not drink alcohol or use drugs.  Allergies: No Known Allergies  Medications Prior to Admission  Medication Sig Dispense Refill  . Aspirin Effervescent (ALKA-SELTZER ORIGINAL PO) Take 2 tablets by mouth daily as needed (upset stomach).    Marland Kitchen ibuprofen (ADVIL,MOTRIN) 200 MG tablet Take 400 mg by mouth every 6 (six) hours as needed for moderate pain.     Marland Kitchen letrozole (FEMARA) 2.5 MG tablet Take 1 tablet (2.5 mg total) by mouth daily. 90 tablet 3  . Liniments (SALONPAS PAIN RELIEF PATCH EX) Apply 1 patch topically daily as needed (pain).    Marland Kitchen alum & mag hydroxide-simeth (GELUSIL) 188-416-60 MG chewable tablet Chew 3 mLs by mouth every 6 (six) hours as needed for indigestion or heartburn.    Marland Kitchen LORazepam (ATIVAN) 1 MG tablet Take 1 tablet (1 mg total) by mouth every 8 (eight) hours as needed for anxiety (or nausea). 10 tablet 0  . [START ON 09/09/2017] palbociclib (IBRANCE) 100 MG capsule Take 1 capsule (174m) by mouth  once daily, mid-morning, with food. Take for 21 days on, 7 days off, repeat every 28 days. (Patient not taking: Reported on 09/05/2017) 21 capsule 3    Blood pressure (!) 96/54, pulse 81, temperature 98.9 F (37.2 C), resp. rate 12, height 5' 8.5" (1.74 m), weight 53.5 kg (118 lb), SpO2 96 %. Physical Exam: Physical Exam  Constitutional: She is oriented to person, place, and time. She appears well-developed. She appears cachectic. She is cooperative.  Non-toxic appearance. No distress.  HENT:  Head: Normocephalic and atraumatic.  Right  Ear: External ear normal.  Left Ear: External ear normal.  Nose: Nose normal.  Mouth/Throat: Mucous membranes are dry.  Eyes: Pupils are equal, round, and reactive to light. Conjunctivae, EOM and lids are normal. No scleral icterus.  Neck: Normal range of motion and phonation normal. Neck supple.  Cardiovascular: Normal rate and regular rhythm.  Pulses:      Radial pulses are 2+ on the right side, and 2+ on the left side.       Dorsalis pedis pulses are 2+ on the right side, and 2+ on the left side.  Pulmonary/Chest: Effort normal and breath sounds normal.  Abdominal: Soft. She exhibits distension (mild). There is no hepatosplenomegaly. There is no tenderness. There is no rigidity, no rebound and no guarding. No hernia.  Tinkling hyperactive bowel sounds in LLQ, otherwise hypoactive  Musculoskeletal:  ROM grossly intact in bilateral upper and lower extremities  Neurological: She is alert and oriented to person, place, and time. No sensory deficit.  Skin: Skin is warm, dry and intact. No rash noted.  Psychiatric: She has a normal mood and affect. Her speech is normal and behavior is normal.    Results for orders placed or performed during the hospital encounter of 09/05/17 (from the past 48 hour(s))  Comprehensive metabolic panel     Status: Abnormal   Collection Time: 09/05/17  3:26 PM  Result Value Ref Range   Sodium 139 135 - 145 mmol/L   Potassium 4.3 3.5 - 5.1 mmol/L   Chloride 105 101 - 111 mmol/L   CO2 22 22 - 32 mmol/L   Glucose, Bld 127 (H) 65 - 99 mg/dL   BUN 15 6 - 20 mg/dL   Creatinine, Ser 0.65 0.44 - 1.00 mg/dL   Calcium 8.8 (L) 8.9 - 10.3 mg/dL   Total Protein 6.1 (L) 6.5 - 8.1 g/dL   Albumin 2.6 (L) 3.5 - 5.0 g/dL   AST 17 15 - 41 U/L   ALT 8 (L) 14 - 54 U/L   Alkaline Phosphatase 100 38 - 126 U/L   Total Bilirubin 0.3 0.3 - 1.2 mg/dL   GFR calc non Af Amer >60 >60 mL/min   GFR calc Af Amer >60 >60 mL/min    Comment: (NOTE) The eGFR has been calculated using  the CKD EPI equation. This calculation has not been validated in all clinical situations. eGFR's persistently <60 mL/min signify possible Chronic Kidney Disease.    Anion gap 12 5 - 15    Comment: Performed at [facility], [place] [address], [place], [place] [address]  CBC with Differential     Status: Abnormal   Collection Time: 09/05/17  3:26 PM  Result Value Ref Range   WBC 4.3 4.0 - 10.5 K/uL   RBC 3.27 (L) 3.87 - 5.11 MIL/uL   Hemoglobin 9.5 (L) 12.0 - 15.0 g/dL   HCT 30.6 (L) 36.0 - 46.0 %   MCV 93.6 78.0 -  100.0 fL   MCH 29.1 26.0 - 34.0 pg   MCHC 31.0 30.0 - 36.0 g/dL   RDW 23.4 (H) 11.5 - 15.5 %   Platelets 521 (H) 150 - 400 K/uL   Neutrophils Relative % 57 %   Lymphocytes Relative 29 %   Monocytes Relative 12 %   Eosinophils Relative 0 %   Basophils Relative 2 %   Neutro Abs 2.5 1.7 - 7.7 K/uL   Lymphs Abs 1.2 0.7 - 4.0 K/uL   Monocytes Absolute 0.5 0.1 - 1.0 K/uL   Eosinophils Absolute 0.0 0.0 - 0.7 K/uL   Basophils Absolute 0.1 0.0 - 0.1 K/uL   RBC Morphology POLYCHROMASIA PRESENT     Comment: TARGET CELLS ELLIPTOCYTES Schistocytes present Performed at [facility], [place] [address] [place], [place] [address]   Lipase, blood     Status: None   Collection Time: 09/05/17  3:26 PM  Result Value Ref Range   Lipase 33 11 - 51 U/L    Comment: Performed at [facility], [place] [address] [place], [place] [address]  I-Stat CG4 Lactic Acid, ED     Status: None   Collection Time: 09/05/17  4:32 PM  Result Value Ref Range   Lactic Acid, Venous 1.01 0.5 - 1.9 mmol/L  CBC     Status: Abnormal   Collection Time: 09/06/17  4:12 AM  Result Value Ref Range   WBC 3.4 (L) 4.0 - 10.5 K/uL   RBC 3.05 (L) 3.87 - 5.11 MIL/uL   Hemoglobin 8.8 (L) 12.0 - 15.0 g/dL   HCT 28.2 (L) 36.0 - 46.0 %   MCV 92.5 78.0 - 100.0 fL   MCH 28.9 26.0 - 34.0 pg   MCHC 31.2 30.0 - 36.0 g/dL   RDW 23.4 (H) 11.5 - 15.5 %   Platelets 542 (H)  150 - 400 K/uL    Comment: Performed at [facility], [place] [address] [place], [place] [address]  Basic metabolic panel     Status: Abnormal   Collection Time: 09/06/17  4:12 AM  Result Value Ref Range   Sodium 140 135 - 145 mmol/L   Potassium 4.0 3.5 - 5.1 mmol/L   Chloride 106 101 - 111 mmol/L   CO2 26 22 - 32 mmol/L   Glucose, Bld 93 65 - 99 mg/dL   BUN 15 6 - 20 mg/dL   Creatinine, Ser 0.69 0.44 - 1.00 mg/dL   Calcium 8.7 (L) 8.9 - 10.3 mg/dL   GFR calc non Af Amer >60 >60 mL/min   GFR calc Af Amer >60 >60 mL/min    Comment: (NOTE) The eGFR has been calculated using the CKD EPI equation. This calculation has not been validated in all clinical situations. eGFR's persistently <60 mL/min signify possible Chronic Kidney Disease.    Anion gap 8 5 - 15    Comment: Performed at [facility], [place] [address] [place], [place] [address]  Urinalysis, Routine w reflex microscopic     Status: Abnormal   Collection Time: 09/06/17  4:20 AM  Result Value Ref Range   Color, Urine YELLOW YELLOW   APPearance CLEAR CLEAR   Specific Gravity, Urine >1.046 (H) 1.005 - 1.030   pH 5.0 5.0 - 8.0   Glucose, UA NEGATIVE NEGATIVE mg/dL   Hgb urine dipstick NEGATIVE NEGATIVE   Bilirubin Urine NEGATIVE NEGATIVE   Ketones, ur 5 (A) NEGATIVE mg/dL   Protein, ur NEGATIVE NEGATIVE mg/dL  Nitrite NEGATIVE NEGATIVE   Leukocytes, UA NEGATIVE NEGATIVE    Comment: Performed at [place] [address], [place], [place] [address]   Ct Head Wo Contrast  Result Date: 09/05/2017 CLINICAL DATA:  Headache stage IV breast cancer EXAM: CT HEAD WITHOUT CONTRAST TECHNIQUE: Contiguous axial images were obtained from the base of the skull through the vertex without intravenous contrast. COMPARISON:  Cervical MRI 02/02/2015 FINDINGS: Brain: No acute territorial infarction, hemorrhage or intracranial mass. The ventricles are nonenlarged. Vascular: No hyperdense  vessels.  Carotid vascular calcification Skull: Small scattered foci of sclerosis in the calvarium. No fracture Sinuses/Orbits: Mild mucosal thickening in the ethmoid sinuses. No acute orbital abnormality Other: None IMPRESSION: 1. No CT evidence for acute intracranial abnormality. 2. Small scattered sclerotic foci in the calvarium, possible metastatic foci given findings on cervical MRI Electronically Signed   By: Donavan Foil M.D.   On: 09/05/2017 19:02   Ct Abdomen Pelvis W Contrast  Result Date: 09/05/2017 CLINICAL DATA:  Sudden onset of abdominal pain and headache. Back pain. EXAM: CT ABDOMEN AND PELVIS WITH CONTRAST TECHNIQUE: Multidetector CT imaging of the abdomen and pelvis was performed using the standard protocol following bolus administration of intravenous contrast. CONTRAST:  146m ISOVUE-300 IOPAMIDOL (ISOVUE-300) INJECTION 61%, 325mOMNIPAQUE IOHEXOL 300 MG/ML SOLN COMPARISON:  07/29/2017 FINDINGS: Lower chest: Left lower lobe atelectasis. Bilateral breast implants, post mastectomy. Hepatobiliary: Stable subcapsular cyst in the left lobe of the liver measuring 11 mm. Normal appearance of the gallbladder. Pancreas: Unremarkable. No pancreatic ductal dilatation or surrounding inflammatory changes. Spleen: Normal in size without focal abnormality. Adrenals/Urinary Tract: Normal adrenal glands. Interval development of bilateral hydronephrosis, mild on the right and moderate on the left. No obstructing calculi. Scattered renal cysts. Stomach/Bowel: Circumferential mucosal thickening of the stomach in the gastric body and antrum. Interval development of dilation of small bowel loops in most of the abdomen, with transitional point in the left lower quadrant of the abdomen, image 50/105, sequence 2. Maximum diameter of dilated small bowel loops 3.5 cm. Circumferential wall thickening of the decompressed small bowel, immediately downstream to the transitional point. Persistent circumferential wall  thickening of the rectosigmoid colon. Vascular/Lymphatic: Large volume ascites with linear and nodular thickening of the peritoneum, omental caking and hyperenhancement of the peritoneum, consistent with peritoneal carcinomatosis, not significantly changed. Reproductive: Status post hysterectomy. No adnexal masses. Other: No abdominal wall hernia. Musculoskeletal: Multiple predominantly lytic metastatic lesions throughout the thoracic and lumbar vertebral bodies, bilateral pelvis, lateral eighth rib are stable. No evidence of pathologic fracture. IMPRESSION: Interval development of early or incomplete/intermittent small bowel obstruction with transitional point in the left lower quadrant of the abdomen. Circumferential thickening of the decompressed small bowels distal to the transitional point suggests serosal metastatic deposits versus segmental enteritis. Interval development of bilateral hydronephrosis, mild on the right and moderate on the left. Stable findings of peritoneal carcinomatosis and large volume ascites. Serosal metastatic deposits versus segmental colitis causing thickening of the rectosigmoid colon. Similar appearance of the gastric body and antrum. Stable appearance of multifocal lytic skeletal metastatic disease. These results were called by telephone at the time of interpretation on 09/05/2017 at 7:19 pm to Dr. NADavonna Belling who verbally acknowledged these results. Electronically Signed   By: DoFidela Salisbury.D.   On: 09/05/2017 19:24      Assessment/Plan Metastatic breast cancer with bone mets and newly diagnosed peritoneal carcinomatosis - 07/29/17  Breast cancer originally diagnosed in 01/18/2002.  First recurrence during augmentation surgery 01/19/2011.  Anemia of chronic disease - hgb 8.8 Bilateral hydronephrosis - per primary   PSBO likely secondary to peritoneal carcinomatosis  - CT: Interval development of early or incomplete/intermittent small bowel obstruction with  transitional point in the left lower quadrant ofthe abdomen. Circumferential thickening of the decompressed small bowels distal to the transitional point suggests serosal metastatic deposits versus segmental enteritis; also some question of mets in rectosigmoid vs colitis - prn antiemetics for nausea - do not think patient needs NGT at this time but will check KUB - passing flatus and not terribly distended - may have ice chips and sips of clear liquids - colace BID and daily miralax - mobilize  FEN: sips/chips, IVF VTE: SCDs, lovenox ID: no current abx, afebrile, WBC 3.4   Brigid Re, [facility] Surgery 09/06/2017, 9:53 AM Pager: [phone] Consults: [phone]  Agree with above. Very limited surgical options. Wonder whether paracenteses would help bowel function.  Followed by Dr. Lindi Adie for oncology. Seen by palliative care.  Still full code.  Alphonsa Overall, MD, [facility] Surgery Pager: [phone] Office phone:  [phone]

## 2017-09-06 NOTE — H&P (Addendum)
Progress Notes         PROGRESS NOTE  Cynthia Rosario WVP:[id number] DOB: 27-Sep-1955 DOA: 09/05/2017 PCP: [name], MD  HPI/Recap of past 24 hours: Cynthia Rosario is a 62 y.o. 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  This AM developed severe headache, nausea, abd pain.  Stopped passing gas or BMs earlier today.   ED Course: Emesis x1 in ED.  CT head is neg, CT abd/pelvis shows early SBO vs PSBO due to possible serosal mets, also shows large volume ascites from known carcinomatosis, also shows moderate L, mild R hydronephrosis.  Still making urine and no change to kidney function on BMP.  09/06/2017: Patient seen and examined at bedside.  She reports persistent nausea even on Zofran.  Started IV Phenergan for refractory nausea.  She has flatus but has not had any bowel movement.  Some abdominal discomfort but no vomiting.  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 Lindi Adie  Bilateral hydronephrosis Urology consulted Due to no infection or acute kidney injury will be observed presently Repeat BMP in the morning  Constipation Started on daily MiraLAX Started on Senokot twice daily Mobilize  Moderate to severe protein calorie malnutrition Encourage increase in oral calorie intake.  Malignant ascites Therapeutic paracentesis every other week Last paracentesis was early this month.  Intractable nausea Iv zofran Start IV phenergan  for refractory nausea  Code Status: Full code  Family Communication: Husband at bedside  Disposition Plan: Home when clinically stable   Consultants: General surgery Urology Hematology and oncology  Procedures:  None  Antimicrobials:  None  DVT prophylaxis:  sq lovenox   Objective: Vitals:   09/05/17 2000 09/05/17 2102 09/06/17 0502 09/06/17 1426  BP: 101/61 (!) 96/59 (!) 96/54 100/67  Pulse: 92 88 81 96  Resp:  _0 Temp:  98.9 F (37.2 C) 98.9 F (37.2 C) 98.3 F (36.8 C)  TempSrc:  Oral  Oral  SpO2: 96% 97% 96% 98%  Weight:      Height:        Intake/Output Summary (Last 24 hours) at 09/06/2017 1522 Last data filed at 09/06/2017 0400 Gross per 24 hour  Intake 501.25 ml  Output -  Net 501.25 ml   Filed Weights   09/05/17 1524  Weight: 53.5 kg (118 lb)    Exam:  . General: 62 y.o. year-old female frail. Alert and oriented x3. . Cardiovascular: Regular rate and rhythm with no rubs or gallops.  No thyromegaly or JVD noted.   Marland Kitchen Respiratory: Clear to auscultation with no wheezes or rales. Good inspiratory effort. . Abdomen: Soft nontender mildly distended with normal bowel sounds x4 quadrants. Transverse scar in her abdomen.. . Musculoskeletal: No lower extremity edema. 2/4 pulses in all 4 extremities. . Skin: No ulcerative lesions noted or rashes, . Psychiatry: Mood is appropriate for condition and setting   Data Reviewed: CBC: Recent Labs  Lab 09/02/17 1442 09/05/17 1526 09/06/17 0412  WBC 2.3* 4.3 3.4*  NEUTROABS 0.6* 2.5  --   HGB 8.5* 9.5* 8.8*  HCT 27.5* 30.6* 28.2*  MCV 92.3 93.6 92.5  PLT 398 521* 650*   Basic Metabolic Panel: Recent Labs  Lab 09/02/17 1442 09/05/17 1526 09/06/17 0412  NA 140 139 140  K 4.2 4.3 4.0  CL 104 105 106  CO2 30* 22 26  GLUCOSE 89 127* 93  BUN _0 CREATININE 0.74 0.65 0.69  CALCIUM 8.4 8.8* 8.7*   GFR: Estimated Creatinine Clearance: 63.2 mL/min (by C-G formula based on SCr of 0.69  mg/dL). Liver Function Tests: Recent Labs  Lab 09/02/17 1442 09/05/17 1526  AST 13 17  ALT <6 8*  ALKPHOS 97 100  BILITOT <0.2* 0.3  PROT 5.5* 6.1*  ALBUMIN 2.1* 2.6*   Recent Labs  Lab 09/05/17 1526  LIPASE 33   No results for input(s): AMMONIA in the last 168 hours. Coagulation Profile: No results for input(s): INR, PROTIME in the last 168 hours. Cardiac Enzymes: No results for input(s): CKTOTAL, CKMB, CKMBINDEX, TROPONINI in the last 168 hours. BNP (last 3 results) No results for input(s): PROBNP in the last 8760 hours. HbA1C: No results for input(s): HGBA1C in the last 72 hours. CBG: No results for input(s): GLUCAP in the last 168 hours. Lipid Profile: No results for input(s): CHOL, HDL, LDLCALC, TRIG, CHOLHDL, LDLDIRECT in the last 72 hours. Thyroid Function Tests: No results for input(s): TSH, T4TOTAL, FREET4, T3FREE, THYROIDAB in the last 72 hours. Anemia Panel: No results for input(s): VITAMINB12, FOLATE, FERRITIN, TIBC, IRON, RETICCTPCT in the last 72 hours. Urine analysis:    Component Value Date/Time   COLORURINE YELLOW 09/06/2017 0420   APPEARANCEUR CLEAR 09/06/2017 0420   LABSPEC >1.046 (H) 09/06/2017 0420   PHURINE 5.0 09/06/2017 0420   GLUCOSEU NEGATIVE 09/06/2017 0420   HGBUR NEGATIVE 09/06/2017 0420   BILIRUBINUR NEGATIVE 09/06/2017 0420   BILIRUBINUR neg 01/29/2012 0952   KETONESUR 5 (A) 09/06/2017 0420   PROTEINUR NEGATIVE 09/06/2017 0420   UROBILINOGEN negative 01/29/2012 0952   UROBILINOGEN 0.2 04/07/2010 2251   NITRITE NEGATIVE 09/06/2017 0420   LEUKOCYTESUR NEGATIVE 09/06/2017 0420   Sepsis Labs: _1 (procalcitonin:4,lacticidven:4)  )No results found for this or any previous visit (from the past 240 hour(s)).    Studies: Ct Head Wo Contrast  Result Date: 09/05/2017 CLINICAL DATA:  Headache stage IV breast cancer EXAM: CT HEAD WITHOUT CONTRAST TECHNIQUE: Contiguous axial images were obtained from the base of the skull through  the vertex without intravenous contrast. COMPARISON:  Cervical MRI 02/02/2015 FINDINGS: Brain: No acute territorial infarction, hemorrhage or intracranial mass. The ventricles are nonenlarged. Vascular: No hyperdense vessels.  Carotid vascular calcification Skull: Small scattered foci of sclerosis in the calvarium. No fracture Sinuses/Orbits: Mild mucosal thickening in the ethmoid sinuses. No acute orbital abnormality Other: None IMPRESSION: 1. No CT evidence for acute intracranial abnormality. 2. Small scattered sclerotic foci in the calvarium, possible metastatic foci given findings on cervical MRI Electronically Signed   By: Donavan Foil M.D.   On: 09/05/2017 19:02   Ct Abdomen Pelvis W Contrast  Result Date: 09/05/2017 CLINICAL DATA:  Sudden onset of abdominal pain and headache. Back pain. EXAM: CT ABDOMEN AND PELVIS WITH CONTRAST TECHNIQUE: Multidetector CT imaging of the abdomen and pelvis was performed using the standard protocol following bolus administration of intravenous contrast. CONTRAST:  115m ISOVUE-300 IOPAMIDOL (ISOVUE-300) INJECTION 61%, 369mOMNIPAQUE IOHEXOL 300 MG/ML SOLN COMPARISON:  07/29/2017 FINDINGS: Lower chest: Left lower lobe atelectasis. Bilateral breast implants, post mastectomy. Hepatobiliary: Stable subcapsular cyst in the left lobe of the liver  measuring 11 mm. Normal appearance of the gallbladder. Pancreas: Unremarkable. No pancreatic ductal dilatation or surrounding inflammatory changes. Spleen: Normal in size without focal abnormality. Adrenals/Urinary Tract: Normal adrenal glands. Interval development of bilateral hydronephrosis, mild on the right and moderate on the left. No obstructing calculi. Scattered renal cysts. Stomach/Bowel: Circumferential mucosal thickening of the stomach in the gastric body and antrum. Interval development of dilation of small bowel loops in most of the abdomen, with transitional point in the left lower quadrant of the abdomen, image 50/105,  sequence 2. Maximum diameter of dilated small bowel loops 3.5 cm. Circumferential wall thickening of the decompressed small bowel, immediately downstream to the transitional point. Persistent circumferential wall thickening of the rectosigmoid colon. Vascular/Lymphatic: Large volume ascites with linear and nodular thickening of the peritoneum, omental caking and hyperenhancement of the peritoneum, consistent with peritoneal carcinomatosis, not significantly changed. Reproductive: Status post hysterectomy. No adnexal masses. Other: No abdominal wall hernia. Musculoskeletal: Multiple predominantly lytic metastatic lesions throughout the thoracic and lumbar vertebral bodies, bilateral pelvis, lateral eighth rib are stable. No evidence of pathologic fracture. IMPRESSION: Interval development of early or incomplete/intermittent small bowel obstruction with transitional point in the left lower quadrant of the abdomen. Circumferential thickening of the decompressed small bowels distal to the transitional point suggests serosal metastatic deposits versus segmental enteritis. Interval development of bilateral hydronephrosis, mild on the right and moderate on the left. Stable findings of peritoneal carcinomatosis and large volume ascites. Serosal metastatic deposits versus segmental colitis causing thickening of the rectosigmoid colon. Similar appearance of the gastric body and antrum. Stable appearance of multifocal lytic skeletal metastatic disease. These results were called by telephone at the time of interpretation on 09/05/2017 at 7:19 pm to Dr. Davonna Belling , who verbally acknowledged these results. Electronically Signed   By: Fidela Salisbury M.D.   On: 09/05/2017 19:24   Dg Abd Portable 1v  Result Date: 09/06/2017 CLINICAL DATA:  Partial small bowel obstruction. EXAM: PORTABLE ABDOMEN - 1 VIEW COMPARISON:  CT yesterday. FINDINGS: Urinary tract contrast is present. Persistent evidence of small bowel  obstruction with dilated fluid and air-filled loops of small intestine similar to the previous CT. Bowel loops are central, consistent with the presence of ascites. IMPRESSION: Plain radiographic evidence of persistent small bowel obstruction and ascites. Electronically Signed   By: Nelson Chimes M.D.   On: 09/06/2017 11:04    Scheduled Meds: . docusate sodium  100 mg Oral BID  . enoxaparin (LOVENOX) injection  40 mg Subcutaneous Q24H  . letrozole  2.5 mg Oral Daily  . polyethylene glycol  17 g Oral Daily    Continuous Infusions: . sodium chloride 75 mL/hr at 09/06/17 1104     LOS: 1 day     Kayleen Memos, MD [facility] Pager [phone]  If 7PM-7AM, please contact night-coverage [email or web address] Password [email or web address] 09/06/2017, 3:22 PM

## 2017-09-06 NOTE — Progress Notes (Signed)
Hem-Onc: Adm with SBO Being treated conservatively. Metastatic breast cancer: On Ibrance with letrozole. Cynthia Rosario is currently on hold. I discussed with the patient that we will hold Ibrance until the bowel obstruction has resolved. I also discussed that if her symptoms are related to peritoneal carcinomatosis, then we may consider changing her from Ibrance to chemotherapy. I will see her in clinic at her scheduled appointment in a couple of weeks.

## 2017-09-06 NOTE — Consult Note (Signed)
Urology Consult Note   Requesting Attending Physician:  Kayleen Memos, DO Service Providing Consult: Urology  Consulting Attending: Louis Meckel   Reason for Consult:  Bilateral Hydronephrosis  HPI: Cynthia Rosario is seen in consultation for reasons noted above at the request of Kayleen Memos, DO for evaluation of bilateral hydronephrosis.  This is a 62 y.o. female with BRCA metastatic bresat cancer w/ peritoneal carcinomatosis and malignant ascites. She presented to the ED on 09/05/17 w/ 1 week of abdm pain and nausea. Passing flatus, no BMs.   Stable on arrival. No fever or tachycardia.  WBC - 4.3 Cr - 0.65 U/A - negative LE, negative nitrites, otherwise normal   Ct revealed incomplete/intermittent SBO w/ transition point in LLQ. Bilateral hydronephrosis ( on review notable for intra-renal pelvic dilation, no ureteral dilation noted). Serosal metastatic deposits causing a thickening rectosigmoid colon.   Today, doing OK. No flank pain. No fever or chills. Nauseous  WBC - 3.4 Cr - 0.69 Afebrile and normal vital signs   Of note, the patient is currently holding treatment for met breast cancer - she will see her oncologist next week   Also, [name] does have urinary leakage. She leaks when she laughs, stand or coughs. She also has urgency and urge associated leakage.   Past Medical History: Past Medical History:  Diagnosis Date  . Abnormal pap   . Anemia   . Breast cancer ([place])   . Cervical polyp   . Chronic headaches   . Dermoid   . Fibroid   . Kidney stones   . Osteopenia   . Ovarian cyst   . Paresthesia 04/07/2015  . Urinary urgency     Past Surgical History:  Past Surgical History:  Procedure Laterality Date  . ABDOMINAL HYSTERECTOMY  09/18/13   robotic hyst, bso for metastatic breast cancer, Dr Alycia Rossetti at [facility].  Marland Kitchen BREAST ENHANCEMENT SURGERY    . BREAST LUMPECTOMY     x2  . IR PARACENTESIS  08/12/2017  . MASTECTOMY Bilateral   . RECONSTRUCTION BREAST W/ TRAM FLAP     . WISDOM TOOTH EXTRACTION      Medication: Current Facility-Administered Medications  Medication Dose Route Frequency Provider Last Rate Last Dose  . 0.9 %  sodium chloride infusion   Intravenous Continuous Etta Quill, DO 75 mL/hr at 09/06/17 1104    . acetaminophen (TYLENOL) tablet 650 mg  650 mg Oral Q6H PRN Etta Quill, DO   650 mg at 09/05/17 2112   Or  . acetaminophen (TYLENOL) suppository 650 mg  650 mg Rectal Q6H PRN Etta Quill, DO      . docusate sodium (COLACE) capsule 100 mg  100 mg Oral BID Rayburn, Kelly A, PA-C      . enoxaparin (LOVENOX) injection 40 mg  40 mg Subcutaneous Q24H Jennette Kettle M, DO   40 mg at 09/05/17 2136  . ketorolac (TORADOL) 15 MG/ML injection 15-30 mg  15-30 mg Intravenous Q6H PRN Etta Quill, DO      . letrozole [facility]) tablet 2.5 mg  2.5 mg Oral Daily Jennette Kettle M, DO      . LORazepam (ATIVAN) injection 0.5-1 mg  0.5-1 mg Intravenous Q6H PRN Etta Quill, DO      . morphine 2 MG/ML injection 2-4 mg  2-4 mg Intravenous Q4H PRN Etta Quill, DO      . ondansetron [facility]) tablet 4 mg  4 mg Oral Q6H PRN Etta Quill, DO  Or  . ondansetron (ZOFRAN) injection 4 mg  4 mg Intravenous Q6H PRN Etta Quill, DO   4 mg at 09/06/17 0444  . polyethylene glycol (MIRALAX / GLYCOLAX) packet 17 g  17 g Oral Daily Rayburn, Kelly A, PA-C   17 g at 09/06/17 1100  . promethazine (PHENERGAN) injection 6.25 mg  6.25 mg Intravenous Q6H PRN Irene Pap N, DO   6.25 mg at 09/06/17 1104    Allergies: No Known Allergies  Social History: Social History   Tobacco Use  . Smoking status: Former Smoker    Last attempt to quit: 1983    Years since quitting: 36.3  . Smokeless tobacco: Never Used  Substance Use Topics  . Alcohol use: No  . Drug use: No    Family History Family History  Problem Relation Age of Onset  . Heart disease Father   . Hypertension Mother   . Stomach cancer Paternal Grandmother     Review of  Systems 10 systems were reviewed and are negative except as noted specifically in the HPI.  Objective   Vital signs in last 24 hours: BP (!) 96/54 (BP Location: Right Arm)   Pulse 81   Temp 98.9 F (37.2 C)   Resp 12   Ht 5' 8.5" (1.74 m)   Wt 53.5 kg (118 lb)   SpO2 96%   BMI 17.68 kg/m   Physical Exam General: NAD, A&O, resting, appropriate Cardiovascular: HDS, adequate peripheral perfusion Abdomen: Soft. Distended  GU: no CVA tenderness Extremities: warm and well perfused Neuro: Appropriate, no focal neurological deficits  Most Recent Labs: Lab Results  Component Value Date   WBC 3.4 (L) 09/06/2017   HGB 8.8 (L) 09/06/2017   HCT 28.2 (L) 09/06/2017   PLT 542 (H) 09/06/2017    Lab Results  Component Value Date   NA 140 09/06/2017   K 4.0 09/06/2017   CL 106 09/06/2017   CO2 26 09/06/2017   BUN 15 09/06/2017   CREATININE 0.69 09/06/2017   CALCIUM 8.7 (L) 09/06/2017    No results found for: INR, APTT   IMAGING: Ct Head Wo Contrast  Result Date: 09/05/2017 CLINICAL DATA:  Headache stage IV breast cancer EXAM: CT HEAD WITHOUT CONTRAST TECHNIQUE: Contiguous axial images were obtained from the base of the skull through the vertex without intravenous contrast. COMPARISON:  Cervical MRI 02/02/2015 FINDINGS: Brain: No acute territorial infarction, hemorrhage or intracranial mass. The ventricles are nonenlarged. Vascular: No hyperdense vessels.  Carotid vascular calcification Skull: Small scattered foci of sclerosis in the calvarium. No fracture Sinuses/Orbits: Mild mucosal thickening in the ethmoid sinuses. No acute orbital abnormality Other: None IMPRESSION: 1. No CT evidence for acute intracranial abnormality. 2. Small scattered sclerotic foci in the calvarium, possible metastatic foci given findings on cervical MRI Electronically Signed   By: Donavan Foil M.D.   On: 09/05/2017 19:02   Ct Abdomen Pelvis W Contrast  Result Date: 09/05/2017 CLINICAL DATA:  Sudden  onset of abdominal pain and headache. Back pain. EXAM: CT ABDOMEN AND PELVIS WITH CONTRAST TECHNIQUE: Multidetector CT imaging of the abdomen and pelvis was performed using the standard protocol following bolus administration of intravenous contrast. CONTRAST:  181m ISOVUE-300 IOPAMIDOL (ISOVUE-300) INJECTION 61%, 338mOMNIPAQUE IOHEXOL 300 MG/ML SOLN COMPARISON:  07/29/2017 FINDINGS: Lower chest: Left lower lobe atelectasis. Bilateral breast implants, post mastectomy. Hepatobiliary: Stable subcapsular cyst in the left lobe of the liver measuring 11 mm. Normal appearance of the gallbladder. Pancreas: Unremarkable. No pancreatic ductal dilatation or  surrounding inflammatory changes. Spleen: Normal in size without focal abnormality. Adrenals/Urinary Tract: Normal adrenal glands. Interval development of bilateral hydronephrosis, mild on the right and moderate on the left. No obstructing calculi. Scattered renal cysts. Stomach/Bowel: Circumferential mucosal thickening of the stomach in the gastric body and antrum. Interval development of dilation of small bowel loops in most of the abdomen, with transitional point in the left lower quadrant of the abdomen, image 50/105, sequence 2. Maximum diameter of dilated small bowel loops 3.5 cm. Circumferential wall thickening of the decompressed small bowel, immediately downstream to the transitional point. Persistent circumferential wall thickening of the rectosigmoid colon. Vascular/Lymphatic: Large volume ascites with linear and nodular thickening of the peritoneum, omental caking and hyperenhancement of the peritoneum, consistent with peritoneal carcinomatosis, not significantly changed. Reproductive: Status post hysterectomy. No adnexal masses. Other: No abdominal wall hernia. Musculoskeletal: Multiple predominantly lytic metastatic lesions throughout the thoracic and lumbar vertebral bodies, bilateral pelvis, lateral eighth rib are stable. No evidence of pathologic  fracture. IMPRESSION: Interval development of early or incomplete/intermittent small bowel obstruction with transitional point in the left lower quadrant of the abdomen. Circumferential thickening of the decompressed small bowels distal to the transitional point suggests serosal metastatic deposits versus segmental enteritis. Interval development of bilateral hydronephrosis, mild on the right and moderate on the left. Stable findings of peritoneal carcinomatosis and large volume ascites. Serosal metastatic deposits versus segmental colitis causing thickening of the rectosigmoid colon. Similar appearance of the gastric body and antrum. Stable appearance of multifocal lytic skeletal metastatic disease. These results were called by telephone at the time of interpretation on 09/05/2017 at 7:19 pm to Dr. Davonna Belling , who verbally acknowledged these results. Electronically Signed   By: Fidela Salisbury M.D.   On: 09/05/2017 19:24   Dg Abd Portable 1v  Result Date: 09/06/2017 CLINICAL DATA:  Partial small bowel obstruction. EXAM: PORTABLE ABDOMEN - 1 VIEW COMPARISON:  CT yesterday. FINDINGS: Urinary tract contrast is present. Persistent evidence of small bowel obstruction with dilated fluid and air-filled loops of small intestine similar to the previous CT. Bowel loops are central, consistent with the presence of ascites. IMPRESSION: Plain radiographic evidence of persistent small bowel obstruction and ascites. Electronically Signed   By: Nelson Chimes M.D.   On: 09/06/2017 11:04    ------  Assessment:  62 y.o. female with BRCA metastatic bresat cancer w/ peritoneal carcinomatosis and malignant ascites admitted with SBO. CT revealed bilateral hydronephrosis ( intrarenal). Creatine at baseline. Normal WBC, afebrile. No flank pain.   Most likely etiologies of hydronephrosis include malignant disease or intrinsic pressure for peritoneal ascites   Discussed options with patient, including observation,  bilateral stents and bilateral PCNs. Given that the patient is doing well, has a normal creatinine and no signs of infection. We opted for observation. If the patient were to show signs of infection please contact urology   As long as the patients creatine is stable and she has no signs of infection, there is no urologic contraindication to cancer treatment   Recommendations: - Follow up in 4-6 weeks w/ Renal ultrasound  - Please call urology if patient shows signs of sepsis or renal failure    Thank you for this consult. Please contact the urology consult pager with any further questions/concerns.

## 2017-09-06 NOTE — Consult Note (Signed)
Consultation Note Date: 09/06/2017   Patient Name: Cynthia Rosario  DOB: April 21, 1956  MRN: [id number]  Age / Sex: 62 y.o., female  PCP: [name], MD Referring Physician: Kayleen Memos, DO  Reason for Consultation: Establishing goals of care  HPI/Patient Profile: 62 y.o. female admitted on 09/05/2017    Clinical Assessment and Goals of Care:  62 year old lady, lives at home with husband in [place], [place], medical history of metastatic breast cancer, peritoneal carcinomatosis, ascites, has therapeutic para centesis every other week, was also on Ibrance with Letrozole, [facility] has been on hold as patient has been admitted with bowel obstruction.   A palliative consult was requested at the time of admit.   Patient is awake alert resting in bed, husband at bedside, I introduced myself and palliative care as follows: Palliative medicine is specialized medical care for people living with serious illness. It focuses on providing relief from the symptoms and stress of a serious illness. The goal is to improve quality of life for both the patient and the family.  Discussed with patient and husband about her pain and non pain symptom management. Patient denies pain, she does have some abdominal discomfort, how ever, she complains of nausea that is uncontrolled.   See additional discussions and recommendations below, thank you for the consult.   NEXT OF KIN  husband is next of kin.   DISCUSSIONS/SUMMARY OF RECOMMENDATIONS    Agree with current anti emetic regimen.  Continue current mode of care Med onc and surgery colleagues following No additional PMT specific recommendations at this time. Thank you for the consult.   Code Status/Advance Care Planning:  Full code    Symptom Management:    as above   Palliative Prophylaxis:   Bowel Regimen  Additional Recommendations (Limitations, Scope,  Preferences):  Full Scope Treatment  Psycho-social/Spiritual:   Desire for further Chaplaincy support:no  Additional Recommendations: Caregiving  Support/Resources  Prognosis:   Unable to determine  Discharge Planning: Home with Home Health      Primary Diagnoses: Present on Admission: . SBO (small bowel obstruction) ([facility]) . Breast cancer of upper-outer quadrant of left female breast ([facility]) . Peritoneal carcinomatosis ([facility]) . Malignant ascites . Bilateral hydronephrosis   I have reviewed the medical record, interviewed the patient and family, and examined the patient. The following aspects are pertinent.  Past Medical History:  Diagnosis Date  . Abnormal pap   . Anemia   . Breast cancer ([facility])   . Cervical polyp   . Chronic headaches   . Dermoid   . Fibroid   . Kidney stones   . Osteopenia   . Ovarian cyst   . Paresthesia 04/07/2015  . Urinary urgency    Social History   Socioeconomic History  . Marital status: Married    Spouse name: Not on file  . Number of children: 2  . Years of education: master's  . Highest education level: Not on file  Occupational History  . Occupation: Sports coach  Social Needs  .  Financial resource strain: Not on file  . Food insecurity:    Worry: Not on file    Inability: Not on file  . Transportation needs:    Medical: Not on file    Non-medical: Not on file  Tobacco Use  . Smoking status: Former Smoker    Last attempt to quit: 1983    Years since quitting: 36.3  . Smokeless tobacco: Never Used  Substance and Sexual Activity  . Alcohol use: No  . Drug use: No  . Sexual activity: Yes    Birth control/protection: Condom  Lifestyle  . Physical activity:    Days per week: Not on file    Minutes per session: Not on file  . Stress: Not on file  Relationships  . Social connections:    Talks on phone: Not on file    Gets together: Not on file    Attends religious service: Not on file    Active member  of club or organization: Not on file    Attends meetings of clubs or organizations: Not on file    Relationship status: Not on file  Other Topics Concern  . Not on file  Social History Narrative   Patient drinks 2 cups of caffeine daily.   Patient right handed.    Family History  Problem Relation Age of Onset  . Heart disease Father   . Hypertension Mother   . Stomach cancer Paternal Grandmother    Scheduled Meds: . docusate sodium  100 mg Oral BID  . enoxaparin (LOVENOX) injection  40 mg Subcutaneous Q24H  . letrozole  2.5 mg Oral Daily  . polyethylene glycol  17 g Oral Daily   Continuous Infusions: . sodium chloride 75 mL/hr at 09/06/17 1104   PRN Meds:.acetaminophen **OR** acetaminophen, ketorolac, LORazepam, morphine injection, ondansetron **OR** ondansetron (ZOFRAN) IV, promethazine Medications Prior to Admission:  Prior to Admission medications   Medication Sig Start Date End Date Taking? Authorizing Provider  Aspirin Effervescent (ALKA-SELTZER ORIGINAL PO) Take 2 tablets by mouth daily as needed (upset stomach).   Yes [provider]  ibuprofen (ADVIL,MOTRIN) 200 MG tablet Take 400 mg by mouth every 6 (six) hours as needed for moderate pain.    Yes [provider]  letrozole (FEMARA) 2.5 MG tablet Take 1 tablet (2.5 mg total) by mouth daily. 08/05/17  Yes [name], MD  Liniments (SALONPAS PAIN RELIEF PATCH EX) Apply 1 patch topically daily as needed (pain).   Yes [provider]  alum & mag hydroxide-simeth (GELUSIL) 200-200-25 MG chewable tablet Chew 3 mLs by mouth every 6 (six) hours as needed for indigestion or heartburn.    [provider]  LORazepam (ATIVAN) 1 MG tablet Take 1 tablet (1 mg total) by mouth every 8 (eight) hours as needed for anxiety (or nausea). 08/21/17   [name], MD  palbociclib [facility]) 100 MG capsule Take 1 capsule (125mg ) by mouth once daily, mid-morning, with food. Take for 21 days on, 7 days off, repeat  every 28 days. Patient not taking: Reported on 09/05/2017 09/09/17   [name], MD   No Known Allergies Review of Systems +nausea  Physical Exam Awake alert In mild distress due to nausea Cachectic S1 S2 Clear Abdomen is slightly distended No edema  Vital Signs: BP (!) 96/54 (BP Location: Right Arm)   Pulse 81   Temp 98.9 F (37.2 C)   Resp 12   Ht 5' 8.5" (1.74 m)   Wt 53.5 kg (118 lb)  SpO2 96%   BMI 17.68 kg/m  Pain Scale: 0-10   Pain Score: 0-No pain(more nausea than pain)   SpO2: SpO2: 96 % O2 Device:SpO2: 96 % O2 Flow Rate: .   IO: Intake/output summary:   Intake/Output Summary (Last 24 hours) at 09/06/2017 1306 Last data filed at 09/06/2017 0400 Gross per 24 hour  Intake 501.25 ml  Output -  Net 501.25 ml    LBM: Last BM Date: 09/04/17 Baseline Weight: Weight: 53.5 kg (118 lb) Most recent weight: Weight: 53.5 kg (118 lb)     Palliative Assessment/Data:   PPS 50%  Time In:  12 Time Out:  1300 Time Total:  60 min  Greater than 50%  of this time was spent counseling and coordinating care related to the above assessment and plan.  Signed by: Loistine Chance, MD  [phone]  Please contact Palliative Medicine Team phone at [phone] for questions and concerns.  For individual provider: See Shea Evans

## 2017-09-07 DIAGNOSIS — C801 Malignant (primary) neoplasm, unspecified: Secondary | ICD-10-CM

## 2017-09-07 DIAGNOSIS — C786 Secondary malignant neoplasm of retroperitoneum and peritoneum: Secondary | ICD-10-CM

## 2017-09-07 DIAGNOSIS — N1330 Unspecified hydronephrosis: Secondary | ICD-10-CM

## 2017-09-07 DIAGNOSIS — K56600 Partial intestinal obstruction, unspecified as to cause: Principal | ICD-10-CM

## 2017-09-07 LAB — CBC
HCT: 26.9 % — ABNORMAL LOW (ref 36.0–46.0)
Hemoglobin: 8.4 g/dL — ABNORMAL LOW (ref 12.0–15.0)
MCH: 29.4 pg (ref 26.0–34.0)
MCHC: 31.2 g/dL (ref 30.0–36.0)
MCV: 94.1 fL (ref 78.0–100.0)
Platelets: 495 10*3/uL — ABNORMAL HIGH (ref 150–400)
RBC: 2.86 MIL/uL — ABNORMAL LOW (ref 3.87–5.11)
RDW: 24.2 % — ABNORMAL HIGH (ref 11.5–15.5)
WBC: 4.4 10*3/uL (ref 4.0–10.5)

## 2017-09-07 LAB — BASIC METABOLIC PANEL
Anion gap: 10 (ref 5–15)
BUN: 19 mg/dL (ref 6–20)
CO2: 22 mmol/L (ref 22–32)
Calcium: 8.3 mg/dL — ABNORMAL LOW (ref 8.9–10.3)
Chloride: 108 mmol/L (ref 101–111)
Creatinine, Ser: 0.67 mg/dL (ref 0.44–1.00)
GFR calc Af Amer: >60 mL/min (ref >60)
GFR calc non Af Amer: >60 mL/min (ref >60)
Glucose, Bld: 87 mg/dL (ref 65–99)
Potassium: 4 mmol/L (ref 3.5–5.1)
Sodium: 140 mmol/L (ref 135–145)

## 2017-09-07 MED ORDER — ONDANSETRON HCL 4 MG PO TABS: 4.0000 mg | ORAL_TABLET | Freq: Four times a day (QID) | ORAL | 0 refills | Status: DC | PRN

## 2017-09-07 MED ORDER — DOCUSATE SODIUM 100 MG PO CAPS: 100.0000 mg | ORAL_CAPSULE | Freq: Two times a day (BID) | ORAL | 0 refills | Status: AC

## 2017-09-07 MED ORDER — POLYETHYLENE GLYCOL 3350 17 G PO PACK: 17.0000 g | PACK | Freq: Every day | ORAL | 0 refills | Status: AC

## 2017-09-07 NOTE — Discharge Instructions (Signed)
Small Bowel Obstruction °A small bowel obstruction means that something is blocking the small bowel. The small bowel is also called the small intestine. It is the long tube that connects the stomach to the colon. An obstruction will stop food and fluids from passing through the small bowel. Treatment depends on what is causing the problem and how bad the problem is. °Follow these instructions at home: °· Get a lot of rest. °· Follow your diet as told by your doctor. You may need to: °? Only drink clear liquids until you start to get better. °? Avoid solid foods as told by your doctor. °· Take over-the-counter and prescription medicines only as told by your doctor. °· Keep all follow-up visits as told by your doctor. This is important. °Contact a doctor if: °· You have a fever. °· You have chills. °Get help right away if: °· You have pain or cramps that get worse. °· You throw up (vomit) blood. °· You have a feeling of being sick to your stomach (nausea) that does not go away. °· You cannot stop throwing up. °· You cannot drink fluids. °· You feel confused. °· You feel dry or thirsty (dehydrated). °· Your belly gets more bloated. °· You feel weak or you pass out (faint). °This information is not intended to replace advice given to you by your health care provider. Make sure you discuss any questions you have with your health care provider. °Document Released: 05/17/2004 Document Revised: 12/05/2015 Document Reviewed: 06/03/2014 °Elsevier Interactive Patient Education © 2018 [employer] ° °

## 2017-09-07 NOTE — Progress Notes (Signed)
Progress Notes         PROGRESS NOTE  Cynthia Rosario JJH:[id number] DOB: 1956/02/22 DOA: 09/05/2017 PCP: [name], MD  HPI/Recap of past 24 hours: Cynthia Rosario is a 62 y.o. 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  This AM developed severe headache, nausea, abd pain.  Stopped passing gas or BMs earlier today.   ED Course: Emesis x1 in ED.  CT head is neg, CT abd/pelvis shows early SBO vs PSBO due to possible serosal mets, also shows large volume ascites from known carcinomatosis, also shows moderate L, mild R hydronephrosis.  Still making urine and no change to kidney function on BMP.  09/06/2017: Patient seen and examined at bedside.  She reports persistent nausea even on Zofran.  Started IV Phenergan for refractory nausea.  She has flatus but has not had any bowel movement.  Some abdominal discomfort but no vomiting.  09/07/17: Passing flatus. Denies nausea or abd pain. Some abdominal distention from her ascites. States she would like to wait to have paracentesis done close to her daughter high school graduation in June.  Assessment/Plan: Principal Problem:   SBO (small bowel obstruction) ([facility]) Active Problems:   Breast cancer of upper-outer quadrant of left female breast ([facility])   Peritoneal carcinomatosis ([facility])   Malignant ascites   Bilateral hydronephrosis  Partial SBO, appears stable Gen surgery following. Highly appreciated Mobilize Clear liquid diet as recommended by surgery  Stage IV breast CA Diagnosed 16 years ago With malignant ascites  New Peritoneal carcinomatosis Follows with Dr. Lindi Adie  B/L hydronephrosis, stable Urology consulted Due to no infection or acute kidney injury will be observed presently Repeat BMP in the morning  Chronic constipation Mobilize C/w bowel regimen C/w daily MiraLAX C/w Senokot twice  daily  Malignant ascites, stable Therapeutic paracentesis every other week Last paracentesis was early this month.  Intractable nausea, resolved C/w Iv zofran C/w IV phenergan for refractory nausea  Code Status: Full code  Family Communication: Husband at bedside  Disposition Plan: Home when clinically stable   Consultants: General surgery Urology Hematology and oncology  Procedures:  None  Antimicrobials:  None  DVT prophylaxis:  sq lovenox   Objective: Vitals:   09/06/17 1426 09/06/17 2041 09/06/17 2057 09/07/17 0435  BP: 100/67 (!) 88/55 110/70 99/66  Pulse: 96 95  93  Resp: _0 Temp: 98.3 F (36.8 C) 99.1 F (37.3 C)  98.5 F (36.9 C)  TempSrc: Oral Oral  Oral  SpO2: 98% 99%  96%  Weight:      Height:        Intake/Output Summary (Last 24 hours) at 09/07/2017 1343 Last data filed at 09/07/2017 0436 Gross per 24 hour  Intake 0 ml  Output -  Net 0 ml   Filed Weights   09/05/17 1524  Weight: 53.5 kg (118 lb)    Exam: 09/07/17  . General: 62 y.o. year-old female frail NAD A&O x 3 . Cardiovascular: RRR no rubs or gallops. No JVD or thyromegaly.  Marland Kitchen Respiratory: Clear to auscultation with no wheezes or rales. Good inspiratory effort. . Abdomen: Soft nontender mildly distended with normal bowel sounds x4 quadrants. Transverse scar in her abdomen.. . Musculoskeletal: No lower extremity edema. 2/4 pulses in all 4 extremities. . Skin: No ulcerative lesions noted or rashes, . Psychiatry: Mood is appropriate for condition and setting   Data Reviewed: CBC: Recent Labs  Lab 09/02/17 1442 09/05/17 1526 09/06/17 0412 09/07/17 0333  WBC 2.3* 4.3 3.4* 4.4  NEUTROABS 0.6* 2.5  --   --   HGB 8.5* 9.5* 8.8* 8.4*  HCT 27.5* 30.6* 28.2* 26.9*  MCV 92.3 93.6 92.5 94.1  PLT 398 521* 542* 794*   Basic Metabolic Panel: Recent Labs  Lab 09/02/17 1442 09/05/17 1526 09/06/17 0412 09/07/17 0333  NA 140 139 140 140  K 4.2 4.3 4.0 4.0  CL 104 105  106 108  CO2 30* _0 GLUCOSE 89 127* 93 87  BUN _1 CREATININE 0.74 0.65 0.69 0.67  CALCIUM 8.4 8.8* 8.7* 8.3*   GFR: Estimated Creatinine Clearance: 63.2 mL/min (by C-G formula based on SCr of 0.67 mg/dL). Liver Function Tests: Recent Labs  Lab 09/02/17 1442 09/05/17 1526  AST 13 17  ALT <6 8*  ALKPHOS 97 100  BILITOT <0.2* 0.3  PROT 5.5* 6.1*  ALBUMIN 2.1* 2.6*   Recent Labs  Lab 09/05/17 1526  LIPASE 33   No results for input(s): AMMONIA in the last 168 hours. Coagulation Profile: No results for input(s): INR, PROTIME in the last 168 hours. Cardiac Enzymes: No results for input(s): CKTOTAL, CKMB, CKMBINDEX, TROPONINI in the last 168 hours. BNP (last 3 results) No results for input(s): PROBNP in the last 8760 hours. HbA1C: No results for input(s): HGBA1C in the last 72 hours. CBG: No results for input(s): GLUCAP in the last 168 hours. Lipid Profile: No results for input(s): CHOL, HDL, LDLCALC, TRIG, CHOLHDL, LDLDIRECT in the last 72 hours. Thyroid Function Tests: No results for input(s): TSH, T4TOTAL, FREET4, T3FREE, THYROIDAB in the last 72 hours. Anemia Panel: No results for input(s): VITAMINB12, FOLATE, FERRITIN, TIBC, IRON, RETICCTPCT in the last 72 hours. Urine analysis:    Component Value Date/Time   COLORURINE YELLOW 09/06/2017 0420   APPEARANCEUR CLEAR 09/06/2017 0420   LABSPEC >1.046 (H) 09/06/2017 0420   PHURINE 5.0 09/06/2017 0420   GLUCOSEU NEGATIVE 09/06/2017 0420   HGBUR NEGATIVE 09/06/2017 0420   BILIRUBINUR NEGATIVE 09/06/2017 0420   BILIRUBINUR neg 01/29/2012 0952   KETONESUR 5 (A) 09/06/2017 0420   PROTEINUR NEGATIVE 09/06/2017 0420   UROBILINOGEN negative 01/29/2012 0952   UROBILINOGEN 0.2 04/07/2010 2251   NITRITE NEGATIVE 09/06/2017 0420   LEUKOCYTESUR NEGATIVE 09/06/2017 0420   Sepsis Labs: _2 (procalcitonin:4,lacticidven:4)  )No results found for this or any previous visit (from the past 240 hour(s)).     Studies: No results found.  Scheduled Meds: . docusate sodium  100 mg Oral BID  . enoxaparin (LOVENOX) injection  40 mg Subcutaneous Q24H  . letrozole  2.5 mg Oral Daily  . polyethylene glycol  17 g Oral Daily    Continuous Infusions:    LOS: 2 days     Kayleen Memos, MD [facility] Pager [phone]  If 7PM-7AM, please contact night-coverage [email or web address] Password [email or web address] 09/07/2017, 1:43 PM

## 2017-09-07 NOTE — Discharge Summary (Signed)
Discharge Summary  Cynthia Rosario BDZ:[id number] DOB: [birth year]  PCP: [name], MD  Admit date: 09/05/2017 Discharge date: 09/07/2017  Time spent: 25 minutes  Recommendations for Outpatient Follow-up:  1. Follow up with Dr Ninfa Linden 2. Follow up with PCP/hemoconlogy 3. Follow up with urology 4. Take your medications as prescribed  Discharge Diagnoses:  Active Hospital Problems   Diagnosis Date Noted  . SBO (small bowel obstruction) ([place]) 09/05/2017  . Bilateral hydronephrosis 09/05/2017  . Malignant ascites 08/05/2017  . Peritoneal carcinomatosis ([place]) 07/29/2017  . Breast cancer of upper-outer quadrant of left female breast [facility])     Resolved Hospital Problems  No resolved problems to display.    Discharge Condition: stable  Diet recommendation: Resume previous diet  Vitals:   09/07/17 0435 09/07/17 1346  BP: 99/66 93/60  Pulse: 93 88  Resp: 15   Temp: 98.5 F (36.9 C) 99.9 F (37.7 C)  SpO2: 96% 97%    History of present illness:  Cynthia Rosario a 62 y.o.femalewith medical history significant ofmetastatic BRCA with peritoneal carcinomatosis and ascites, gets theraputic paracentesis every other week, had 1 week ago.  This AM developed severe headache, nausea, abd pain.  Stopped passing gas or BMs earlier today.   ED Course:Emesis x1 in ED. CT head is neg, CT abd/pelvis shows early SBO vs PSBO due to possible serosal mets, also shows large volume ascites from known carcinomatosis, also shows moderate L, mild R hydronephrosis. Still making urine and no change to kidney function on BMP.  09/06/2017: Patient seen and examined at bedside.  She reports persistent nausea even on Zofran.  Started IV Phenergan for refractory nausea.  She has flatus but has not had any bowel movement.  Some abdominal discomfort but no vomiting.  09/07/17: Passing flatus. Denies nausea or abd pain. Some abdominal distention from her ascites. States she would like to wait  to have paracentesis done close to her daughter high school graduation in June.  Patient asks to go home. RN reports patient tolerated regular diet well. No nausea, vomiting or abdominal pain and had a decent size bowel movement. Patient will need to follow up with general surgery, PCP, and oncology post discharge.    Hospital Course:  Principal Problem:   SBO (small bowel obstruction) (HCC) Active Problems:   Breast cancer of upper-outer quadrant of left female breast ([place])   Peritoneal carcinomatosis ([place])   Malignant ascites   Bilateral hydronephrosis  Partial SBO, appears stable Gen surgery following. Highly appreciated Mobilize Clear liquid diet as recommended by surgery Advanced to regular diet and tolerated well on 09/07/17. No nausea, No abdominal pain. Had a decent size bowel movement 09/07/17.  Stage IV breast CA Diagnosed 16 years ago With malignant ascites  New Peritoneal carcinomatosis Follows with Dr. Lindi Adie  B/L hydronephrosis, stable Urology consulted Due to no infection or acute kidney injury will be observed presently  Chronic constipation Mobilize C/w bowel regimen C/w daily MiraLAX C/w Senokot twice daily  Malignant ascites, stable Therapeutic paracentesis every other week Last paracentesis was early this month.  Intractable nausea, resolved C/w Iv zofran C/w IV phenergan for refractory nausea For discharge, po zofran prn   Procedures:  None  Consultations:  General surgery  Discharge Exam: BP 93/60 (BP Location: Left Arm)   Pulse 88   Temp 99.9 F (37.7 C) (Oral)   Resp 15   Ht 5' 8.5" (1.74 m)   Wt 53.5 kg (118 lb)   SpO2 97%   BMI 17.68 kg/m  .  General: 62 y.o. year-old female well developed well nourished in no acute distress.  Alert and oriented x3. . Cardiovascular: Regular rate and rhythm with no rubs or gallops.  No thyromegaly or JVD noted.   Marland Kitchen Respiratory: Clear to auscultation with no wheezes or rales. Good  inspiratory effort. . Abdomen: Soft nontender nondistended with normal bowel sounds x4 quadrants. . Musculoskeletal: No lower extremity edema. 2/4 pulses in all 4 extremities. . Skin: No ulcerative lesions noted or rashes, . Psychiatry: Mood is appropriate for condition and setting  Discharge Instructions You were cared for by a hospitalist during your hospital stay. If you have any questions about your discharge medications or the care you received while you were in the hospital after you are discharged, you can call the unit and asked to speak with the hospitalist on call if the hospitalist that took care of you is not available. Once you are discharged, your primary care physician will handle any further medical issues. Please note that NO REFILLS for any discharge medications will be authorized once you are discharged, as it is imperative that you return to your primary care physician (or establish a relationship with a primary care physician if you do not have one) for your aftercare needs so that they can reassess your need for medications and monitor your lab values.   Allergies as of 09/07/2017   No Known Allergies     Medication List    STOP taking these medications   ibuprofen 200 MG tablet Commonly known as:  ADVIL,MOTRIN     TAKE these medications   ALKA-SELTZER ORIGINAL PO Take 2 tablets by mouth daily as needed (upset stomach).   alum & mag hydroxide-simeth 200-200-25 MG chewable tablet Commonly known as:  GELUSIL Chew 3 mLs by mouth every 6 (six) hours as needed for indigestion or heartburn.   docusate sodium 100 MG capsule Commonly known as:  COLACE Take 1 capsule (100 mg total) by mouth 2 (two) times daily.   letrozole 2.5 MG tablet Commonly known as:  FEMARA Take 1 tablet (2.5 mg total) by mouth daily.   LORazepam 1 MG tablet Commonly known as:  ATIVAN Take 1 tablet (1 mg total) by mouth every 8 (eight) hours as needed for anxiety (or nausea).   ondansetron 4  MG tablet Commonly known as:  ZOFRAN Take 1 tablet (4 mg total) by mouth every 6 (six) hours as needed for nausea.   palbociclib 100 MG capsule Commonly known as:  IBRANCE Take 1 capsule ('125mg'$ ) by mouth once daily, mid-morning, with food. Take for 21 days on, 7 days off, repeat every 28 days. Start taking on:  09/09/2017   polyethylene glycol packet Commonly known as:  MIRALAX / GLYCOLAX Take 17 g by mouth daily. Start taking on:  09/08/2017   SALONPAS PAIN RELIEF PATCH EX Apply 1 patch topically daily as needed (pain).      No Known Allergies Follow-up Information    [name], MD. Call in 2 day(s).   Specialty:  Hematology and Oncology Why:  please call to make appointment on Monday 09/09/17. Contact information: [place] [address] [phone]        Coralie Keens, MD. Call in 2 day(s).   Specialty:  General Surgery Why:  please call to make appointment. Contact information: [address] [place] [address] [phone]            The results of significant diagnostics from this hospitalization (including  imaging, microbiology, ancillary and laboratory) are listed below for reference.    Significant Diagnostic Studies: Ct Head Wo Contrast  Result Date: 09/05/2017 CLINICAL DATA:  Headache stage IV breast cancer EXAM: CT HEAD WITHOUT CONTRAST TECHNIQUE: Contiguous axial images were obtained from the base of the skull through the vertex without intravenous contrast. COMPARISON:  Cervical MRI 02/02/2015 FINDINGS: Brain: No acute territorial infarction, hemorrhage or intracranial mass. The ventricles are nonenlarged. Vascular: No hyperdense vessels.  Carotid vascular calcification Skull: Small scattered foci of sclerosis in the calvarium. No fracture Sinuses/Orbits: Mild mucosal thickening in the ethmoid sinuses. No acute orbital abnormality Other: None IMPRESSION: 1. No CT evidence for acute intracranial  abnormality. 2. Small scattered sclerotic foci in the calvarium, possible metastatic foci given findings on cervical MRI Electronically Signed   By: Donavan Foil M.D.   On: 09/05/2017 19:02   Ct Abdomen Pelvis W Contrast  Result Date: 09/05/2017 CLINICAL DATA:  Sudden onset of abdominal pain and headache. Back pain. EXAM: CT ABDOMEN AND PELVIS WITH CONTRAST TECHNIQUE: Multidetector CT imaging of the abdomen and pelvis was performed using the standard protocol following bolus administration of intravenous contrast. CONTRAST:  137m ISOVUE-300 IOPAMIDOL (ISOVUE-300) INJECTION 61%, 324mOMNIPAQUE IOHEXOL 300 MG/ML SOLN COMPARISON:  07/29/2017 FINDINGS: Lower chest: Left lower lobe atelectasis. Bilateral breast implants, post mastectomy. Hepatobiliary: Stable subcapsular cyst in the left lobe of the liver measuring 11 mm. Normal appearance of the gallbladder. Pancreas: Unremarkable. No pancreatic ductal dilatation or surrounding inflammatory changes. Spleen: Normal in size without focal abnormality. Adrenals/Urinary Tract: Normal adrenal glands. Interval development of bilateral hydronephrosis, mild on the right and moderate on the left. No obstructing calculi. Scattered renal cysts. Stomach/Bowel: Circumferential mucosal thickening of the stomach in the gastric body and antrum. Interval development of dilation of small bowel loops in most of the abdomen, with transitional point in the left lower quadrant of the abdomen, image 50/105, sequence 2. Maximum diameter of dilated small bowel loops 3.5 cm. Circumferential wall thickening of the decompressed small bowel, immediately downstream to the transitional point. Persistent circumferential wall thickening of the rectosigmoid colon. Vascular/Lymphatic: Large volume ascites with linear and nodular thickening of the peritoneum, omental caking and hyperenhancement of the peritoneum, consistent with peritoneal carcinomatosis, not significantly changed. Reproductive:  Status post hysterectomy. No adnexal masses. Other: No abdominal wall hernia. Musculoskeletal: Multiple predominantly lytic metastatic lesions throughout the thoracic and lumbar vertebral bodies, bilateral pelvis, lateral eighth rib are stable. No evidence of pathologic fracture. IMPRESSION: Interval development of early or incomplete/intermittent small bowel obstruction with transitional point in the left lower quadrant of the abdomen. Circumferential thickening of the decompressed small bowels distal to the transitional point suggests serosal metastatic deposits versus segmental enteritis. Interval development of bilateral hydronephrosis, mild on the right and moderate on the left. Stable findings of peritoneal carcinomatosis and large volume ascites. Serosal metastatic deposits versus segmental colitis causing thickening of the rectosigmoid colon. Similar appearance of the gastric body and antrum. Stable appearance of multifocal lytic skeletal metastatic disease. These results were called by telephone at the time of interpretation on 09/05/2017 at 7:19 pm to Dr. NADavonna Belling who verbally acknowledged these results. Electronically Signed   By: DoFidela Salisbury.D.   On: 09/05/2017 19:24   [place]acentesis  Result Date: 08/29/2017 INDICATION: Patient with history of metastatic breast cancer, recurrent malignant ascites. Request made for therapeutic paracentesis. EXAM: ULTRASOUND GUIDED THERAPEUTIC PARACENTESIS MEDICATIONS: None COMPLICATIONS: None immediate. PROCEDURE: Informed written consent was obtained from the patient after a  discussion of the risks, benefits and alternatives to treatment. A timeout was performed prior to the initiation of the procedure. Initial ultrasound scanning demonstrates a large amount of ascites within the right mid to lower abdominal quadrant. The right mid to lower abdomen was prepped and draped in the usual sterile fashion. 1% lidocaine was used for local anesthesia.  Following this, a 19 gauge, 7-cm, Yueh catheter was introduced. An ultrasound image was saved for documentation purposes. The paracentesis was performed. The catheter was removed and a dressing was applied. The patient tolerated the procedure well without immediate post procedural complication. FINDINGS: A total of approximately 5.9 liters of yellow fluid was removed. IMPRESSION: Successful ultrasound-guided therapeutic paracentesis yielding 5.9 liters of peritoneal fluid. Read by: Rowe Robert, PA-C Electronically Signed   By: Sandi Mariscal M.D.   On: 08/29/2017 11:17   Dg Abd Portable 1v  Result Date: 09/06/2017 CLINICAL DATA:  Partial small bowel obstruction. EXAM: PORTABLE ABDOMEN - 1 VIEW COMPARISON:  CT yesterday. FINDINGS: Urinary tract contrast is present. Persistent evidence of small bowel obstruction with dilated fluid and air-filled loops of small intestine similar to the previous CT. Bowel loops are central, consistent with the presence of ascites. IMPRESSION: Plain radiographic evidence of persistent small bowel obstruction and ascites. Electronically Signed   By: Nelson Chimes M.D.   On: 09/06/2017 11:04   Ir Paracentesis  Result Date: 08/12/2017 INDICATION: Patient with history of metastatic breast cancer and recurrent ascites. Request made for therapeutic paracentesis. EXAM: ULTRASOUND GUIDED THERAPEUTIC PARACENTESIS MEDICATIONS: 10 mL of 2% lidocaine COMPLICATIONS: None immediate. PROCEDURE: Informed written consent was obtained from the patient after a discussion of the risks, benefits and alternatives to treatment. A timeout was performed prior to the initiation of the procedure. Initial ultrasound scanning demonstrates a moderate amount of ascites within the left lower abdominal quadrant. The left lower abdomen was prepped and draped in the usual sterile fashion. 2% lidocaine was used for local anesthesia. Following this, a 19 gauge, 7-cm, Yueh catheter was introduced. An ultrasound image  was saved for documentation purposes. The paracentesis was performed. The catheter was removed and a dressing was applied. The patient tolerated the procedure well without immediate post procedural complication. FINDINGS: A total of approximately 3.1 liters of yellow fluid was removed. IMPRESSION: Successful ultrasound-guided paracentesis yielding 3.1 liters of peritoneal fluid. Read by: Earley Abide, PA-C Electronically Signed   By: Jerilynn Mages.  Shick M.D.   On: 08/12/2017 14:22    Microbiology: No results found for this or any previous visit (from the past 240 hour(s)).   Labs: Basic Metabolic Panel: Recent Labs  Lab 09/02/17 1442 09/05/17 1526 09/06/17 0412 09/07/17 0333  NA 140 139 140 140  K 4.2 4.3 4.0 4.0  CL 104 105 106 108  CO2 30* 22 26 22   GLUCOSE 89 127* 93 87  BUN 14 15 15 19   CREATININE 0.74 0.65 0.69 0.67  CALCIUM 8.4 8.8* 8.7* 8.3*   Liver Function Tests: Recent Labs  Lab 09/02/17 1442 09/05/17 1526  AST 13 17  ALT <6 8*  ALKPHOS 97 100  BILITOT <0.2* 0.3  PROT 5.5* 6.1*  ALBUMIN 2.1* 2.6*   Recent Labs  Lab 09/05/17 1526  LIPASE 33   No results for input(s): AMMONIA in the last 168 hours. CBC: Recent Labs  Lab 09/02/17 1442 09/05/17 1526 09/06/17 0412 09/07/17 0333  WBC 2.3* 4.3 3.4* 4.4  NEUTROABS 0.6* 2.5  --   --   HGB 8.5* 9.5* 8.8* 8.4*  HCT 27.5* 30.6* 28.2* 26.9*  MCV 92.3 93.6 92.5 94.1  PLT 398 521* 542* 495*   Cardiac Enzymes: No results for input(s): CKTOTAL, CKMB, CKMBINDEX, TROPONINI in the last 168 hours. BNP: BNP (last 3 results) No results for input(s): BNP in the last 8760 hours.  ProBNP (last 3 results) No results for input(s): PROBNP in the last 8760 hours.  CBG: No results for input(s): GLUCAP in the last 168 hours.     Signed:  Kayleen Memos, MD [facility] 09/07/2017, 4:55 PM

## 2017-09-07 NOTE — Progress Notes (Signed)
Subjective/Chief Complaint: Feels better Denies abdominal pain or nausea this morning Passing more flatus   Objective: Vital signs in last 24 hours: Temp:  [98.3 F (36.8 C)-99.1 F (37.3 C)] 98.5 F (36.9 C) (05/18 0435) Pulse Rate:  [93-96] 93 (05/18 0435) Resp:  [15-17] 15 (05/18 0435) BP: (88-110)/(55-70) 99/66 (05/18 0435) SpO2:  [96 %-99 %] 96 % (05/18 0435) Last BM Date: 09/04/17  Intake/Output from previous day: No intake/output data recorded. Intake/Output this shift: No intake/output data recorded.  Exam: Looks comfortable Abdomen distended but feels more like ascites, non-tender  Lab Results:  Recent Labs    09/06/17 0412 09/07/17 0333  WBC 3.4* 4.4  HGB 8.8* 8.4*  HCT 28.2* 26.9*  PLT 542* 495*   BMET Recent Labs    09/06/17 0412 09/07/17 0333  NA 140 140  K 4.0 4.0  CL 106 108  CO2 26 22  GLUCOSE 93 87  BUN 15 19  CREATININE 0.69 0.67  CALCIUM 8.7* 8.3*   PT/INR No results for input(s): LABPROT, INR in the last 72 hours. ABG No results for input(s): PHART, HCO3 in the last 72 hours.  Invalid input(s): PCO2, PO2  Studies/Results: Ct Head Wo Contrast  Result Date: 09/05/2017 CLINICAL DATA:  Headache stage IV breast cancer EXAM: CT HEAD WITHOUT CONTRAST TECHNIQUE: Contiguous axial images were obtained from the base of the skull through the vertex without intravenous contrast. COMPARISON:  Cervical MRI 02/02/2015 FINDINGS: Brain: No acute territorial infarction, hemorrhage or intracranial mass. The ventricles are nonenlarged. Vascular: No hyperdense vessels.  Carotid vascular calcification Skull: Small scattered foci of sclerosis in the calvarium. No fracture Sinuses/Orbits: Mild mucosal thickening in the ethmoid sinuses. No acute orbital abnormality Other: None IMPRESSION: 1. No CT evidence for acute intracranial abnormality. 2. Small scattered sclerotic foci in the calvarium, possible metastatic foci given findings on cervical MRI  Electronically Signed   By: Donavan Foil M.D.   On: 09/05/2017 19:02   Ct Abdomen Pelvis W Contrast  Result Date: 09/05/2017 CLINICAL DATA:  Sudden onset of abdominal pain and headache. Back pain. EXAM: CT ABDOMEN AND PELVIS WITH CONTRAST TECHNIQUE: Multidetector CT imaging of the abdomen and pelvis was performed using the standard protocol following bolus administration of intravenous contrast. CONTRAST:  16mL ISOVUE-300 IOPAMIDOL (ISOVUE-300) INJECTION 61%, 1mL OMNIPAQUE IOHEXOL 300 MG/ML SOLN COMPARISON:  07/29/2017 FINDINGS: Lower chest: Left lower lobe atelectasis. Bilateral breast implants, post mastectomy. Hepatobiliary: Stable subcapsular cyst in the left lobe of the liver measuring 11 mm. Normal appearance of the gallbladder. Pancreas: Unremarkable. No pancreatic ductal dilatation or surrounding inflammatory changes. Spleen: Normal in size without focal abnormality. Adrenals/Urinary Tract: Normal adrenal glands. Interval development of bilateral hydronephrosis, mild on the right and moderate on the left. No obstructing calculi. Scattered renal cysts. Stomach/Bowel: Circumferential mucosal thickening of the stomach in the gastric body and antrum. Interval development of dilation of small bowel loops in most of the abdomen, with transitional point in the left lower quadrant of the abdomen, image 50/105, sequence 2. Maximum diameter of dilated small bowel loops 3.5 cm. Circumferential wall thickening of the decompressed small bowel, immediately downstream to the transitional point. Persistent circumferential wall thickening of the rectosigmoid colon. Vascular/Lymphatic: Large volume ascites with linear and nodular thickening of the peritoneum, omental caking and hyperenhancement of the peritoneum, consistent with peritoneal carcinomatosis, not significantly changed. Reproductive: Status post hysterectomy. No adnexal masses. Other: No abdominal wall hernia. Musculoskeletal: Multiple predominantly lytic  metastatic lesions throughout the thoracic and lumbar vertebral bodies,  bilateral pelvis, lateral eighth rib are stable. No evidence of pathologic fracture. IMPRESSION: Interval development of early or incomplete/intermittent small bowel obstruction with transitional point in the left lower quadrant of the abdomen. Circumferential thickening of the decompressed small bowels distal to the transitional point suggests serosal metastatic deposits versus segmental enteritis. Interval development of bilateral hydronephrosis, mild on the right and moderate on the left. Stable findings of peritoneal carcinomatosis and large volume ascites. Serosal metastatic deposits versus segmental colitis causing thickening of the rectosigmoid colon. Similar appearance of the gastric body and antrum. Stable appearance of multifocal lytic skeletal metastatic disease. These results were called by telephone at the time of interpretation on 09/05/2017 at 7:19 pm to Dr. Davonna Belling , who verbally acknowledged these results. Electronically Signed   By: Fidela Salisbury M.D.   On: 09/05/2017 19:24   Dg Abd Portable 1v  Result Date: 09/06/2017 CLINICAL DATA:  Partial small bowel obstruction. EXAM: PORTABLE ABDOMEN - 1 VIEW COMPARISON:  CT yesterday. FINDINGS: Urinary tract contrast is present. Persistent evidence of small bowel obstruction with dilated fluid and air-filled loops of small intestine similar to the previous CT. Bowel loops are central, consistent with the presence of ascites. IMPRESSION: Plain radiographic evidence of persistent small bowel obstruction and ascites. Electronically Signed   By: Nelson Chimes M.D.   On: 09/06/2017 11:04    Anti-infectives: Anti-infectives (From admission, onward)   None      Assessment/Plan: psbo with carcinomatosis  Will try clear liquids with her improving  LOS: 2 days    Dontea Corlew A 09/07/2017

## 2017-09-09 ENCOUNTER — Other Ambulatory Visit: Payer: Self-pay

## 2017-09-09 DIAGNOSIS — C801 Malignant (primary) neoplasm, unspecified: Secondary | ICD-10-CM

## 2017-09-09 DIAGNOSIS — C50412 Malignant neoplasm of upper-outer quadrant of left female breast: Secondary | ICD-10-CM

## 2017-09-09 DIAGNOSIS — C786 Secondary malignant neoplasm of retroperitoneum and peritoneum: Secondary | ICD-10-CM

## 2017-09-09 NOTE — Progress Notes (Signed)
Received call from pt regarding scheduling her paracentesis towards the end of this week. Pt also needs albumin post paracentesis. She was recently discharge from the hospital for SBO that has now resolved. Pt have been passing gas and have had 3 good BM. Pt does feel more distention from ascites.  Pt is on miralax and is eating no complaints of nausea.   Pt wanted to let Dr.Gudena know that she had started back on her [facility] and will see Dr.Gudena on her next fu appt in June. Told pt that he is currently out of the office today but will let him know tomorrow.   Sent message to scheduling for albumin infusion appt. Pt will wait for appt confirmation call.

## 2017-09-10 NOTE — Telephone Encounter (Signed)
No entry 

## 2017-09-11 ENCOUNTER — Telehealth: Payer: Self-pay | Admitting: Hematology and Oncology

## 2017-09-11 ENCOUNTER — Encounter: Payer: Self-pay | Admitting: Hematology and Oncology

## 2017-09-11 NOTE — Telephone Encounter (Signed)
Scheduled appts per 5/22 sch msg - spoke w/ pt and confirmed appt.

## 2017-09-11 NOTE — Progress Notes (Signed)
Unable to find referral for [place]   I called [employer] and spoke with [facility], nurse case reviewer. Received authorization for  [phone] See Authorization # [id number] Valid from 09/13/2017-04/18/2018

## 2017-09-11 NOTE — Progress Notes (Signed)
Unable to find referral  I called [employer] and spoke with [place], nurse case reviwer.   Received authorization for  [phone] See Authorization # [id number] Valid from 09/13/2017-04/18/2018

## 2017-09-12 ENCOUNTER — Telehealth: Payer: Self-pay

## 2017-09-12 ENCOUNTER — Other Ambulatory Visit: Payer: Self-pay

## 2017-09-12 DIAGNOSIS — R180 Malignant ascites: Secondary | ICD-10-CM

## 2017-09-12 DIAGNOSIS — C50412 Malignant neoplasm of upper-outer quadrant of left female breast: Secondary | ICD-10-CM

## 2017-09-12 NOTE — Telephone Encounter (Signed)
Called pt back and per Dr Lindi Adie- he would like her to have u/s of lower extremities to r/o DVT- of both extremities.  Pt voiced understanding.  Dr Lindi Adie would like it arranged around tomorrow's appts - has thoracentesis and infusion albumin - called vascular lab and appt is at 1 pm.  Pt aware. No other needs at this time.

## 2017-09-12 NOTE — Telephone Encounter (Signed)
Returned pt's call regarding:    She has a paracentesis scheduled for tomorrow but wants Dr Lindi Adie to know she is has swollen legs (below knee), ankles, and feet x 2 days.  Reports "can't really see my ankle bone" and sides of feet look purplish.  Denies any redness, pain, or excessive warmth in lower extremities.   I told her I will let Dr Lindi Adie be aware and I'll call her back.

## 2017-09-13 ENCOUNTER — Telehealth: Payer: Self-pay | Admitting: *Deleted

## 2017-09-13 ENCOUNTER — Ambulatory Visit (HOSPITAL_BASED_OUTPATIENT_CLINIC_OR_DEPARTMENT_OTHER)
Admission: RE | Admit: 2017-09-13 | Discharge: 2017-09-13 | Disposition: A | Discharge status: Home / Self Care | Payer: BLUE CROSS/BLUE SHIELD | Source: Ambulatory Visit | Attending: Hematology and Oncology | Admitting: Hematology and Oncology

## 2017-09-13 ENCOUNTER — Inpatient Hospital Stay: Payer: BLUE CROSS/BLUE SHIELD

## 2017-09-13 ENCOUNTER — Ambulatory Visit (HOSPITAL_COMMUNITY)
Admission: RE | Admit: 2017-09-13 | Discharge: 2017-09-13 | Disposition: A | Discharge status: Home / Self Care | Payer: BLUE CROSS/BLUE SHIELD | Source: Ambulatory Visit | Attending: Hematology and Oncology | Admitting: Hematology and Oncology

## 2017-09-13 DIAGNOSIS — D649 Anemia, unspecified: Secondary | ICD-10-CM | POA: Diagnosis not present

## 2017-09-13 DIAGNOSIS — D72819 Decreased white blood cell count, unspecified: Secondary | ICD-10-CM | POA: Diagnosis not present

## 2017-09-13 DIAGNOSIS — Z9012 Acquired absence of left breast and nipple: Secondary | ICD-10-CM | POA: Diagnosis not present

## 2017-09-13 DIAGNOSIS — Z79811 Long term (current) use of aromatase inhibitors: Secondary | ICD-10-CM | POA: Diagnosis not present

## 2017-09-13 DIAGNOSIS — Z923 Personal history of irradiation: Secondary | ICD-10-CM | POA: Diagnosis not present

## 2017-09-13 DIAGNOSIS — C801 Malignant (primary) neoplasm, unspecified: Principal | ICD-10-CM

## 2017-09-13 DIAGNOSIS — C786 Secondary malignant neoplasm of retroperitoneum and peritoneum: Secondary | ICD-10-CM | POA: Insufficient documentation

## 2017-09-13 DIAGNOSIS — R180 Malignant ascites: Secondary | ICD-10-CM

## 2017-09-13 DIAGNOSIS — R188 Other ascites: Secondary | ICD-10-CM | POA: Insufficient documentation

## 2017-09-13 DIAGNOSIS — Z170 Estrogen receptor positive status [ER+]: Secondary | ICD-10-CM | POA: Diagnosis not present

## 2017-09-13 DIAGNOSIS — C50412 Malignant neoplasm of upper-outer quadrant of left female breast: Secondary | ICD-10-CM

## 2017-09-13 DIAGNOSIS — C7951 Secondary malignant neoplasm of bone: Secondary | ICD-10-CM | POA: Diagnosis not present

## 2017-09-13 DIAGNOSIS — Z9221 Personal history of antineoplastic chemotherapy: Secondary | ICD-10-CM | POA: Diagnosis not present

## 2017-09-13 MED ORDER — ALBUMIN HUMAN 25 % IV SOLN
25.0000 g | Freq: Once | INTRAVENOUS | Status: AC
  Administered 2017-09-13: 25 g via INTRAVENOUS
  Filled 2017-09-13: qty 50

## 2017-09-13 MED ORDER — LIDOCAINE HCL 1 % IJ SOLN
INTRAMUSCULAR | Status: AC
  Filled 2017-09-13: qty 10

## 2017-09-13 NOTE — Telephone Encounter (Signed)
"  [place] with vascular lab with results  Of today's bilateral venous study of legs.  Test is negative bilaterraly.  Patient is on her way back to the [place] for Albumin infusion."

## 2017-09-13 NOTE — Progress Notes (Signed)
Bilateral lower extremity venous duplex completed. Preliminary results - There is no evidence of a DVT, superficial thrombosis, and Baker's cyst. [employer] ,RVS 09/13/2017 1:51 PM

## 2017-09-13 NOTE — Procedures (Signed)
Ultrasound-guided  therapeutic paracentesis performed yielding 6 liters of blood-tinged fluid. No immediate complications.

## 2017-09-13 NOTE — Patient Instructions (Signed)
Albumin injection °What is this medicine? °ALBUMIN (al BYOO min) is used to treat or prevent shock following serious injury, bleeding, surgery, or burns by increasing the volume of blood plasma. This medicine can also replace low blood protein. °This medicine may be used for other purposes; ask your health care provider or pharmacist if you have questions. °COMMON BRAND NAME(S): Albuked, Albumarc, Albuminar, AlbuRx, Albutein, Buminate, Flexbumin, Kedbumin, Macrotec, Plasbumin °What should I tell my health care provider before I take this medicine? °They need to know if you have any of the following conditions: °-anemia °-heart disease °-kidney disease °-an unusual or allergic reaction to albumin, other medicines, foods, dyes, or preservatives °-pregnant or trying to get pregnant °-breast-feeding °How should I use this medicine? °This medicine is for infusion into a vein. It is given by a health-care professional in a hospital or clinic. °Talk to your pediatrician regarding the use of this medicine in children. While this drug may be prescribed for selected conditions, precautions do apply. °Overdosage: If you think you have taken too much of this medicine contact a poison control center or emergency room at once. °NOTE: This medicine is only for you. Do not share this medicine with others. °What if I miss a dose? °This does not apply. °What may interact with this medicine? °Interactions are not expected. °This list may not describe all possible interactions. Give your health care provider a list of all the medicines, herbs, non-prescription drugs, or dietary supplements you use. Also tell them if you smoke, drink alcohol, or use illegal drugs. Some items may interact with your medicine. °What should I watch for while using this medicine? °Your condition will be closely monitored while you receive this medicine. °Some products are derived from human plasma, and there is a small risk that these products may contain  certain types of virus or bacteria. All products are processed to kill most viruses and bacteria. If you have questions concerning the risk of infections, discuss them with your doctor or health care professional. °What side effects may I notice from receiving this medicine? °Side effects that you should report to your doctor or health care professional as soon as possible: °-allergic reactions like skin rash, itching or hives, swelling of the face, lips, or tongue °-breathing problems °-changes in heartbeat °-fever, chills °-pain, redness or swelling at the injection site °-signs of viral infection including fever, drowsiness, chills, runny nose followed in about 2 weeks by a rash and joint pain °-tightness in the chest °Side effects that usually do not require medical attention (report to your doctor or health care professional if they continue or are bothersome): °-increased salivation °-nausea, vomiting °This list may not describe all possible side effects. Call your doctor for medical advice about side effects. You may report side effects to FDA at 1-800-FDA-1088. °Where should I keep my medicine? °This does not apply. You will not be given this medicine to store at home. °NOTE: This sheet is a summary. It may not cover all possible information. If you have questions about this medicine, talk to your doctor, pharmacist, or health care provider. °© 2018 Elsevier/[employer] Standard (2007-07-03 10:18:55) ° °

## 2017-09-20 ENCOUNTER — Other Ambulatory Visit: Payer: Self-pay

## 2017-09-20 DIAGNOSIS — C50412 Malignant neoplasm of upper-outer quadrant of left female breast: Secondary | ICD-10-CM

## 2017-09-23 ENCOUNTER — Inpatient Hospital Stay: Payer: BLUE CROSS/BLUE SHIELD | Attending: Hematology and Oncology | Admitting: Hematology and Oncology

## 2017-09-23 ENCOUNTER — Telehealth: Payer: Self-pay | Admitting: *Deleted

## 2017-09-23 ENCOUNTER — Telehealth: Payer: Self-pay | Admitting: Hematology and Oncology

## 2017-09-23 ENCOUNTER — Inpatient Hospital Stay: Payer: BLUE CROSS/BLUE SHIELD

## 2017-09-23 ENCOUNTER — Other Ambulatory Visit: Payer: Self-pay

## 2017-09-23 ENCOUNTER — Other Ambulatory Visit: Payer: BLUE CROSS/BLUE SHIELD

## 2017-09-23 ENCOUNTER — Other Ambulatory Visit: Payer: Self-pay | Admitting: *Deleted

## 2017-09-23 ENCOUNTER — Encounter: Payer: Self-pay | Admitting: Hematology and Oncology

## 2017-09-23 VITALS — BP 107/72 | HR 83 | Temp 97.7°F | Resp 18 | Ht 68.5 in | Wt 125.4 lb

## 2017-09-23 DIAGNOSIS — D649 Anemia, unspecified: Secondary | ICD-10-CM | POA: Diagnosis not present

## 2017-09-23 DIAGNOSIS — C50412 Malignant neoplasm of upper-outer quadrant of left female breast: Secondary | ICD-10-CM

## 2017-09-23 DIAGNOSIS — C801 Malignant (primary) neoplasm, unspecified: Secondary | ICD-10-CM

## 2017-09-23 DIAGNOSIS — C786 Secondary malignant neoplasm of retroperitoneum and peritoneum: Secondary | ICD-10-CM | POA: Diagnosis not present

## 2017-09-23 DIAGNOSIS — Z79899 Other long term (current) drug therapy: Secondary | ICD-10-CM | POA: Insufficient documentation

## 2017-09-23 DIAGNOSIS — Z9012 Acquired absence of left breast and nipple: Secondary | ICD-10-CM | POA: Diagnosis not present

## 2017-09-23 DIAGNOSIS — Z79811 Long term (current) use of aromatase inhibitors: Secondary | ICD-10-CM | POA: Insufficient documentation

## 2017-09-23 DIAGNOSIS — J910 Malignant pleural effusion: Secondary | ICD-10-CM | POA: Diagnosis not present

## 2017-09-23 DIAGNOSIS — Z923 Personal history of irradiation: Secondary | ICD-10-CM | POA: Diagnosis not present

## 2017-09-23 DIAGNOSIS — C7951 Secondary malignant neoplasm of bone: Secondary | ICD-10-CM | POA: Insufficient documentation

## 2017-09-23 DIAGNOSIS — D72819 Decreased white blood cell count, unspecified: Secondary | ICD-10-CM | POA: Insufficient documentation

## 2017-09-23 DIAGNOSIS — Z170 Estrogen receptor positive status [ER+]: Secondary | ICD-10-CM

## 2017-09-23 LAB — CBC WITH DIFFERENTIAL (CANCER CENTER ONLY)
BASOS ABS: 0.1 10*3/uL (ref 0.0–0.1)
Basophils Relative: 2 %
Eosinophils Absolute: 0 10*3/uL (ref 0.0–0.5)
Eosinophils Relative: 1 %
HEMATOCRIT: 28.8 % — AB (ref 34.8–46.6)
HEMOGLOBIN: 9.4 g/dL — AB (ref 11.6–15.9)
LYMPHS PCT: 39 %
Lymphs Abs: 1.3 10*3/uL (ref 0.9–3.3)
MCH: 30.4 pg (ref 25.1–34.0)
MCHC: 32.5 g/dL (ref 31.5–36.0)
MCV: 93.5 fL (ref 79.5–101.0)
MONO ABS: 0.1 10*3/uL (ref 0.1–0.9)
Monocytes Relative: 4 %
NEUTROS ABS: 1.9 10*3/uL (ref 1.5–6.5)
NEUTROS PCT: 54 %
Platelet Count: 494 10*3/uL — ABNORMAL HIGH (ref 145–400)
RBC: 3.08 MIL/uL — AB (ref 3.70–5.45)
RDW: 27.8 % — ABNORMAL HIGH (ref 11.2–14.5)
WBC: 3.4 10*3/uL — AB (ref 3.9–10.3)

## 2017-09-23 LAB — CMP (CANCER CENTER ONLY)
ALBUMIN: 2.4 g/dL — AB (ref 3.5–5.0)
ALK PHOS: 104 U/L (ref 40–150)
AST: 14 U/L (ref 5–34)
Anion gap: 5 (ref 3–11)
BILIRUBIN TOTAL: 0.2 mg/dL (ref 0.2–1.2)
BUN: 13 mg/dL (ref 7–26)
CO2: 30 mmol/L — ABNORMAL HIGH (ref 22–29)
CREATININE: 0.81 mg/dL (ref 0.60–1.10)
Calcium: 8.8 mg/dL (ref 8.4–10.4)
Chloride: 104 mmol/L (ref 98–109)
GFR, Est AFR Am: >60 mL/min (ref >60)
GLUCOSE: 96 mg/dL (ref 70–140)
POTASSIUM: 4 mmol/L (ref 3.5–5.1)
Sodium: 139 mmol/L (ref 136–145)
TOTAL PROTEIN: 5.8 g/dL — AB (ref 6.4–8.3)

## 2017-09-23 NOTE — Progress Notes (Signed)
Per Dr Lindi Adie, ordered/ scheduled paracentesis at [facility], she will go to infusion for her albumin.  Per him, u/s to send fluid for cytology, cell count, and micro.  I scheduled with central scheduling for [facility] U/S at 10:45 am on Thurs 09/26/2017 and sent urgent message to scheduling for infusion at cancer center of Albumin after paracentesis per Dr Lindi Adie. Made Patient aware via phone- she said she prefers to have these done at [facility] location.

## 2017-09-23 NOTE — Telephone Encounter (Signed)
Gave patient AVS and calendar of upcoming July appointments.  °

## 2017-09-23 NOTE — Telephone Encounter (Signed)
Per Dr Lindi Adie [facility] testing requested per pathology from surgery performed by Dr Kendall Flack on 07/23/2013.  Pathology noted at [place] number [phone].  Form completed and faxed to above at [phone].  BRCA testing also obtained and sent per [employer] on this patient.

## 2017-09-23 NOTE — Progress Notes (Signed)
Patient Care Team: [name], MD as PCP - General (Hematology and Oncology) [name], MD as Consulting Physician (Hematology and Oncology)  DIAGNOSIS:  Encounter Diagnoses  Name Primary?  . Peritoneal carcinomatosis ([place]) Yes  . Malignant neoplasm of upper-outer quadrant of left breast in female, estrogen receptor positive ([place])     SUMMARY OF ONCOLOGIC HISTORY:   Breast cancer of upper-outer quadrant of left female breast ([place])   01/18/2002 Initial Diagnosis    Breast cancer of upper-outer quadrant of left female breast; left mastectomy 2/26 lymph nodes positive ER positive received adjuvant TAC chemotherapy followed by radiation and tamoxifen for 9 months stopped for side effects      01/19/2011 Relapse/Recurrence    Right breast augmentation surgery was attempted by Dr. Truman Hayward and was found to have a lesion in the upper outer quadrant 3.3 x 1.8 cm biopsy revealed invasive ductal carcinoma with lobular features ER 100% PR 88% HER-2 negative      02/06/2011 Surgery    Right breast mastectomy at [facility] and axillary lymph node dissection with removal of breast implant followed by immediate reconstruction with tissue expander 36 lymph nodes were positive received radiation therapy and no chemotherapy      03/21/2011 - 04/20/2011 Radiation Therapy    Radiation to chest wall and axilla      05/29/2011 - 12/31/2011 Anti-estrogen oral therapy    Patient received Arimidex for one week and Zoladex x5 months stop Arimidex due to side effects      07/23/2013 Relapse/Recurrence    2 years of back pain and abdominal symptoms with spotting after intercourse revealed uterus enlarged biopsy revealed metastatic breast cancer lobular type: ER positive PR negative      09/18/2013 Surgery    Hysterectomy and bilateral salpingo-oophorectomy at [facility] revealed metastatic breast cancer involving tubes and uterus      10/13/2013 - 10/20/2013 Anti-estrogen oral therapy    Femara for 30 days stopped for  patchy alopecia and patient going for second and third opinions between [place], [place], [place] regarding the appropriate antiestrogen plan (stopped by choice)      02/02/2015 Imaging    Multiple osseous metastases are present. Foci of enhancement are described at C7, T2, T6, T7, T9, and T10      07/29/2017 Imaging    ED visit for abdominal distention: CT abdomen pelvis: Significant ascites with omental caking and scattered areas of peritoneal enhancement/nodularity consistent with peritoneal carcinomatosis, osseous metastases, wall thickening rectosigmoid colon due to serosal implants      07/30/2017 Procedure    Paracentesis: Malignant cells consistent with adenocarcinoma ER positive PR negative GATA3 and GCDFP Positive; consistent with metastatic breast cancer, HER-2 pending      08/05/2017 -  Anti-estrogen oral therapy    Ibrance with letrozole       CHIEF COMPLIANT: Recurrent ascites  INTERVAL HISTORY: Cynthia Rosario is a 62 year old with metastatic breast cancer who has had recurrent malignant ascites and peritoneal carcinomatosis.  She is currently on Ibrance with letrozole.  Overall she feels really good however she has worsening ascites again.  She thinks that she will need to have paracentesis this Thursday.  Denies any nausea vomiting.  She gets albumin infusions with her paracentesis.  REVIEW OF SYSTEMS:   Constitutional: Denies fevers, chills or abnormal weight loss Eyes: Denies blurriness of vision Ears, nose, mouth, throat, and face: Denies mucositis or sore throat Respiratory: Denies cough, dyspnea or wheezes Cardiovascular: Denies palpitation, chest discomfort Gastrointestinal: Abdominal distention from ascites  Skin: Denies abnormal skin rashes Lymphatics: Denies new lymphadenopathy or easy bruising Neurological:Denies numbness, tingling or new weaknesses Behavioral/Psych: Mood is stable, no new changes  Extremities: No lower extremity edema  All other systems were  reviewed with the patient and are negative.  I have reviewed the past medical history, past surgical history, social history and family history with the patient and they are unchanged from previous note.  ALLERGIES:  has No Known Allergies.  MEDICATIONS:  Current Outpatient Medications  Medication Sig Dispense Refill  . alum & mag hydroxide-simeth (GELUSIL) 201-007-12 MG chewable tablet Chew 3 mLs by mouth every 6 (six) hours as needed for indigestion or heartburn.    . Aspirin Effervescent (ALKA-SELTZER ORIGINAL PO) Take 2 tablets by mouth daily as needed (upset stomach).    . docusate sodium (COLACE) 100 MG capsule Take 1 capsule (100 mg total) by mouth 2 (two) times daily. 10 capsule 0  . letrozole (FEMARA) 2.5 MG tablet Take 1 tablet (2.5 mg total) by mouth daily. 90 tablet 3  . Liniments (SALONPAS PAIN RELIEF PATCH EX) Apply 1 patch topically daily as needed (pain).    . LORazepam (ATIVAN) 1 MG tablet Take 1 tablet (1 mg total) by mouth every 8 (eight) hours as needed for anxiety (or nausea). 10 tablet 0  . ondansetron (ZOFRAN) 4 MG tablet Take 1 tablet (4 mg total) by mouth every 6 (six) hours as needed for nausea. 20 tablet 0  . palbociclib (IBRANCE) 100 MG capsule Take 1 capsule (112m) by mouth once daily, mid-morning, with food. Take for 21 days on, 7 days off, repeat every 28 days. 21 capsule 3  . polyethylene glycol (MIRALAX / GLYCOLAX) packet Take 17 g by mouth daily. 14 each 0   No current facility-administered medications for this visit.     PHYSICAL EXAMINATION: ECOG PERFORMANCE STATUS: 1 - Symptomatic but completely ambulatory  Vitals:   09/23/17 1353  BP: 107/72  Pulse: 83  Resp: 18  Temp: 97.7 F (36.5 C)  SpO2: 100%   Filed Weights   09/23/17 1353  Weight: 125 lb 6.4 oz (56.9 kg)    GENERAL:alert, no distress and comfortable SKIN: skin color, texture, turgor are normal, no rashes or significant lesions EYES: normal, Conjunctiva are pink and non-injected,  sclera clear OROPHARYNX: Ascites NECK: supple, thyroid normal size, non-tender, without nodularity LYMPH:  no palpable lymphadenopathy in the cervical, axillary or inguinal LUNGS: clear to auscultation and percussion with normal breathing effort HEART: regular rate & rhythm and no murmurs and no lower extremity edema ABDOMEN:abdomen soft, non-tender and normal bowel sounds MUSCULOSKELETAL:no cyanosis of digits and no clubbing  NEURO: alert & oriented x 3 with fluent speech, no focal motor/sensory deficits EXTREMITIES: No lower extremity edema  LABORATORY DATA:  I have reviewed the data as listed CMP Latest Ref Rng & Units 09/23/2017 09/07/2017 09/06/2017  Glucose 70 - 140 mg/dL 96 87 93  BUN 7 - 26 mg/dL _0 Creatinine 0.60 - 1.10 mg/dL 0.81 0.67 0.69  Sodium 136 - 145 mmol/L 139 140 140  Potassium 3.5 - 5.1 mmol/L 4.0 4.0 4.0  Chloride 98 - 109 mmol/L 104 108 106  CO2 22 - 29 mmol/L 30(H) 22 26  Calcium 8.4 - 10.4 mg/dL 8.8 8.3(L) 8.7(L)  Total Protein 6.4 - 8.3 g/dL 5.8(L) - -  Total Bilirubin 0.2 - 1.2 mg/dL 0.2 - -  Alkaline Phos 40 - 150 U/L 104 - -  AST 5 - 34 U/L 14 - -  ALT 0 - 55 U/L <6 - -    Lab Results  Component Value Date   WBC 3.4 (L) 09/23/2017   HGB 9.4 (L) 09/23/2017   HCT 28.8 (L) 09/23/2017   MCV 93.5 09/23/2017   PLT 494 (H) 09/23/2017   NEUTROABS 1.9 09/23/2017    ASSESSMENT & PLAN:  Breast cancer of upper-outer quadrant of left female breast ([place]) Metastatic breast cancer with previous bone metastases and newly diagnosed omental/peritoneal carcinomatosis with massive ascites 07/29/2017 Previously she refused antiestrogen therapy.  07/30/2017: Therapeutic paracentesis: Malignant ascites consistent with adenocarcinoma ER positive PR negative HER-2 pending Patient will need another paracentesis this week.  She gets paracentesis once every 2 weeks. Previously 6 L was removed. The next paracentesis we will send cytology cell count as well as  cultures.  Current treatment: Antiestrogen therapywith letrozolealong with CDK 4 and 6 inhibitor like Ibrancestarted 08/05/2017  Toxicities: 1.Anemia: Hemoglobin is 8.5 we will continue to monitor this. 2.leukopenia: ANC 0.6.    Now on 100 mg dosage starting 09/09/2017.  Hospitalization 09/05/2017 to 09/07/2017 due to small bowel obstruction and peritoneal carcinomatosis  It appears that the patient is very slow to respond to [facility] therapy.   I will see her back in 4 weeks for follow-up after scans.      Orders Placed This Encounter  Procedures  . CT Abdomen Pelvis W Contrast    Standing Status:   Future    Standing Expiration Date:   09/23/2018    Order Specific Question:   If indicated for the ordered procedure, I authorize the administration of contrast media per Radiology protocol    Answer:   Yes    Order Specific Question:   Is patient pregnant?    Answer:   No    Order Specific Question:   Preferred imaging location?    Answer:   [facility]    Order Specific Question:   Is Oral Contrast requested for this exam?    Answer:   Yes, Per Radiology protocol    Order Specific Question:   Radiology Contrast Protocol - do NOT remove file path    Answer:   \\[email or web address]    Order Specific Question:   Reason for Exam additional comments    Answer:   Metastatic breast cancer restaging  . CT Chest W Contrast    Standing Status:   Future    Standing Expiration Date:   09/23/2018    Order Specific Question:   If indicated for the ordered procedure, I authorize the administration of contrast media per Radiology protocol    Answer:   Yes    Order Specific Question:   Is patient pregnant?    Answer:   No    Order Specific Question:   Preferred imaging location?    Answer:   [facility]    Order Specific Question:   Radiology Contrast Protocol - do NOT remove file path    Answer:   \\[email or web address]     Order Specific Question:   Reason for Exam additional comments    Answer:   Metastatic breast cancer restaging  . NM Bone Scan Whole Body    Standing Status:   Future    Standing Expiration Date:   09/23/2018    Order Specific Question:   If indicated for the ordered procedure, I authorize the administration of a radiopharmaceutical per Radiology protocol    Answer:   Yes    Order Specific Question:   Is the  patient pregnant?    Answer:   No    Order Specific Question:   Preferred imaging location?    Answer:   [facility]    Order Specific Question:   Radiology Contrast Protocol - do NOT remove file path    Answer:   \\[email or web address]    Order Specific Question:   Reason for Exam additional comments    Answer:   Metastatic breast cancer restaging  . CBC with Differential (Cancer Center Only)    Standing Status:   Future    Number of Occurrences:   1    Standing Expiration Date:   09/24/2018   The patient has a good understanding of the overall plan. she agrees with it. she will call with any problems that may develop before the next visit here.   Harriette Ohara, MD 09/23/17

## 2017-09-23 NOTE — Assessment & Plan Note (Signed)
Metastatic breast cancer with previous bone metastases and newly diagnosed omental/peritoneal carcinomatosis with massive ascites 07/29/2017 Previously she refused antiestrogen therapy.  07/30/2017: Therapeutic paracentesis: Malignant ascites consistent with adenocarcinoma ER positive PR negative HER-2 pending  Current treatment: Antiestrogen therapywith letrozolealong with CDK 4 and 6 inhibitor like Ibrancestarted 08/05/2017  Toxicities: 1.Anemia: Hemoglobin is 8.5 we will continue to monitor this. 2.leukopenia: ANC 0.6.   Now on 100 mg dosage starting 09/09/2017.  Hospitalization 09/05/2017 to 09/07/2017 due to small bowel obstruction and peritoneal carcinomatosis  It appears that the patient is very slow to respond to [facility] therapy.  I discussed with her about treatment options including switching her to chemotherapy versus continuation of Ibrance.  I will see her back in 4 weeks for follow-up.

## 2017-09-26 ENCOUNTER — Ambulatory Visit (HOSPITAL_COMMUNITY)
Admission: RE | Admit: 2017-09-26 | Discharge: 2017-09-26 | Disposition: A | Discharge status: Home / Self Care | Payer: BLUE CROSS/BLUE SHIELD | Source: Ambulatory Visit | Attending: Hematology and Oncology | Admitting: Hematology and Oncology

## 2017-09-26 ENCOUNTER — Telehealth: Payer: Self-pay

## 2017-09-26 ENCOUNTER — Inpatient Hospital Stay: Payer: BLUE CROSS/BLUE SHIELD

## 2017-09-26 ENCOUNTER — Other Ambulatory Visit: Payer: Self-pay | Admitting: Hematology and Oncology

## 2017-09-26 DIAGNOSIS — C50412 Malignant neoplasm of upper-outer quadrant of left female breast: Secondary | ICD-10-CM | POA: Insufficient documentation

## 2017-09-26 DIAGNOSIS — R180 Malignant ascites: Secondary | ICD-10-CM | POA: Diagnosis not present

## 2017-09-26 DIAGNOSIS — C786 Secondary malignant neoplasm of retroperitoneum and peritoneum: Secondary | ICD-10-CM | POA: Insufficient documentation

## 2017-09-26 DIAGNOSIS — C801 Malignant (primary) neoplasm, unspecified: Secondary | ICD-10-CM

## 2017-09-26 MED ORDER — LIDOCAINE HCL 1 % IJ SOLN
INTRAMUSCULAR | Status: AC
  Filled 2017-09-26: qty 20

## 2017-09-26 MED ORDER — ALBUMIN HUMAN 25 % IV SOLN
25.0000 g | Freq: Once | INTRAVENOUS | Status: DC
  Filled 2017-09-26: qty 100

## 2017-09-26 NOTE — Telephone Encounter (Signed)
Left VM for patient regarding her infusion appt today.   I also looked for patient in [address] and [place] long front entrance. Attempted to call ultrasound 4x with no answer.   Cyndia Bent RN

## 2017-09-26 NOTE — Telephone Encounter (Signed)
Cynthia Rosario called back after I had left her the voicemail and she said she told the receptionist she could not wait any longer.  Infusion was not ever notified of this.  She also stated that when she arrived, nothing was right and her appointment should have been scheduled earlier than 1:30 pm.  She will call and reschedule her appointment for the future. I notified Tiffany and Dr. Lindi Adie regarding this patient.  Gardiner Rhyme

## 2017-09-26 NOTE — Telephone Encounter (Signed)
Faxed insurance information to [place] at [employer].  Cyndia Bent RN

## 2017-09-26 NOTE — Telephone Encounter (Signed)
Called patient to let her know we were ready for her and left a voicemail.  Several attempts to find her in the lobby have also been made. Cynthia Rosario

## 2017-09-26 NOTE — Procedures (Signed)
PROCEDURE SUMMARY:  Successful US guided paracentesis from left lateral abdomen.  Yielded 5.6 liters of amber fluid.  No immediate complications.  Pt tolerated well.   Specimen was not sent for labs. Patient with appt at infusion center for albumin post-procedure.   Docia Barrier PA-C 09/26/2017 1:15 PM

## 2017-10-03 ENCOUNTER — Telehealth: Payer: Self-pay

## 2017-10-03 NOTE — Telephone Encounter (Signed)
Received call from [facility] Pathology stating they had received message from [facility] that insurance information for pt still needed.   This information has been faxed to [facility] by this RN. Fax # provided:  [phone]

## 2017-10-07 ENCOUNTER — Ambulatory Visit (HOSPITAL_COMMUNITY)
Admission: RE | Admit: 2017-10-07 | Discharge: 2017-10-07 | Disposition: A | Discharge status: Home / Self Care | Payer: BLUE CROSS/BLUE SHIELD | Source: Ambulatory Visit | Attending: Hematology and Oncology | Admitting: Hematology and Oncology

## 2017-10-07 ENCOUNTER — Encounter (HOSPITAL_COMMUNITY): Payer: Self-pay | Admitting: Student

## 2017-10-07 DIAGNOSIS — R188 Other ascites: Secondary | ICD-10-CM | POA: Insufficient documentation

## 2017-10-07 DIAGNOSIS — Z853 Personal history of malignant neoplasm of breast: Secondary | ICD-10-CM | POA: Diagnosis not present

## 2017-10-07 DIAGNOSIS — R180 Malignant ascites: Secondary | ICD-10-CM

## 2017-10-07 DIAGNOSIS — C50412 Malignant neoplasm of upper-outer quadrant of left female breast: Secondary | ICD-10-CM

## 2017-10-07 DIAGNOSIS — C786 Secondary malignant neoplasm of retroperitoneum and peritoneum: Secondary | ICD-10-CM

## 2017-10-07 DIAGNOSIS — C801 Malignant (primary) neoplasm, unspecified: Secondary | ICD-10-CM

## 2017-10-07 HISTORY — PX: IR PARACENTESIS: IMG2679

## 2017-10-07 MED ORDER — ALBUMIN HUMAN 25 % IV SOLN
25.0000 g | Freq: Once | INTRAVENOUS | Status: AC
  Administered 2017-10-07: 25 g via INTRAVENOUS
  Filled 2017-10-07: qty 100

## 2017-10-07 MED ORDER — LIDOCAINE HCL (PF) 2 % IJ SOLN
INTRAMUSCULAR | Status: AC
  Filled 2017-10-07: qty 20

## 2017-10-07 MED ORDER — LIDOCAINE HCL (PF) 2 % IJ SOLN
INTRAMUSCULAR | Status: DC | PRN
  Administered 2017-10-07: 10 mL

## 2017-10-07 NOTE — Procedures (Addendum)
PROCEDURE SUMMARY:  Successful US guided paracentesis from left lateral abdomen.  Yielded 6.7 of clear, yellow fluid.  No immediate complications.  Pt tolerated well.   Patient will be ordered 25g albumin as this was planned for her last procedure pr Dr. Lindi Adie, although it is noted she did not stay for infusion.   Specimen was not sent for labs.  Docia Barrier PA-C 10/07/2017 11:04 AM

## 2017-10-11 ENCOUNTER — Other Ambulatory Visit: Payer: Self-pay | Admitting: *Deleted

## 2017-10-11 ENCOUNTER — Telehealth: Payer: Self-pay | Admitting: *Deleted

## 2017-10-11 DIAGNOSIS — C786 Secondary malignant neoplasm of retroperitoneum and peritoneum: Secondary | ICD-10-CM

## 2017-10-11 DIAGNOSIS — Z170 Estrogen receptor positive status [ER+]: Secondary | ICD-10-CM

## 2017-10-11 DIAGNOSIS — C50412 Malignant neoplasm of upper-outer quadrant of left female breast: Secondary | ICD-10-CM

## 2017-10-11 DIAGNOSIS — C801 Malignant (primary) neoplasm, unspecified: Principal | ICD-10-CM

## 2017-10-11 NOTE — Telephone Encounter (Signed)
This RN received VM's from pt and from central scheduling requesting an order to be placed for IR paracentesis to be performed at [facility].  This RN placed order for IR paracentesis per verbal order.  Per communication with pt she stated concern due to " seems like I am filling up like a water balloon and then I can't breath well "  Presently Zafira stated " when I called directly to [facility] IR they said they could do it Wednesday- is there a way an order can be placed so I can just call them to schedule ?"  This RN informed pt this note will be sent to MD for possible standing orders that would allow her to call to schedule. Standing orders need to have how many and interval of when paracentesis should be performed.  Note pt will need Albumin orders entered for paracentesis next week.  Namrata verbalized appreciation of above discussion.

## 2017-10-14 ENCOUNTER — Other Ambulatory Visit: Payer: Self-pay

## 2017-10-14 DIAGNOSIS — C786 Secondary malignant neoplasm of retroperitoneum and peritoneum: Secondary | ICD-10-CM

## 2017-10-14 DIAGNOSIS — C801 Malignant (primary) neoplasm, unspecified: Principal | ICD-10-CM

## 2017-10-14 NOTE — Progress Notes (Signed)
Confirmed IR parecentesis with pt at [facility] on wed 6/26. Pt prefers scheduling her appt herself. She had been needing paracentesis every 10-14 days. Will obtain standing orders for paracentesis for pt, as well as albumin orders.

## 2017-10-16 ENCOUNTER — Ambulatory Visit (HOSPITAL_COMMUNITY): Payer: BLUE CROSS/BLUE SHIELD

## 2017-10-16 ENCOUNTER — Ambulatory Visit (HOSPITAL_COMMUNITY)
Admission: RE | Admit: 2017-10-16 | Discharge: 2017-10-16 | Disposition: A | Discharge status: Home / Self Care | Payer: BLUE CROSS/BLUE SHIELD | Source: Ambulatory Visit | Attending: Hematology and Oncology | Admitting: Hematology and Oncology

## 2017-10-16 ENCOUNTER — Encounter (HOSPITAL_COMMUNITY): Payer: Self-pay | Admitting: Radiology

## 2017-10-16 DIAGNOSIS — C801 Malignant (primary) neoplasm, unspecified: Secondary | ICD-10-CM

## 2017-10-16 DIAGNOSIS — Z853 Personal history of malignant neoplasm of breast: Secondary | ICD-10-CM | POA: Insufficient documentation

## 2017-10-16 DIAGNOSIS — R180 Malignant ascites: Secondary | ICD-10-CM | POA: Insufficient documentation

## 2017-10-16 DIAGNOSIS — C50412 Malignant neoplasm of upper-outer quadrant of left female breast: Secondary | ICD-10-CM

## 2017-10-16 DIAGNOSIS — C786 Secondary malignant neoplasm of retroperitoneum and peritoneum: Secondary | ICD-10-CM

## 2017-10-16 DIAGNOSIS — Z170 Estrogen receptor positive status [ER+]: Secondary | ICD-10-CM

## 2017-10-16 HISTORY — PX: IR PARACENTESIS: IMG2679

## 2017-10-16 MED ORDER — LIDOCAINE HCL (PF) 2 % IJ SOLN
INTRAMUSCULAR | Status: DC | PRN
  Administered 2017-10-16: 10 mL

## 2017-10-16 MED ORDER — ALBUMIN HUMAN 25 % IV SOLN
INTRAVENOUS | Status: AC
  Administered 2017-10-16: 25 g via INTRAVENOUS
  Filled 2017-10-16: qty 50

## 2017-10-16 MED ORDER — ALBUMIN HUMAN 25 % IV SOLN: 12.5000 g | Freq: Once | INTRAVENOUS | Status: DC

## 2017-10-16 MED ORDER — ALBUMIN HUMAN 25 % IV SOLN
INTRAVENOUS | Status: AC
  Filled 2017-10-16: qty 50

## 2017-10-16 MED ORDER — ALBUMIN HUMAN 25 % IV SOLN
25.0000 g | Freq: Once | INTRAVENOUS | Status: AC
  Administered 2017-10-16: 25 g via INTRAVENOUS
  Filled 2017-10-16: qty 100

## 2017-10-16 MED ORDER — LIDOCAINE HCL (PF) 2 % IJ SOLN
INTRAMUSCULAR | Status: AC
  Filled 2017-10-16: qty 20

## 2017-10-16 NOTE — Procedures (Signed)
Ultrasound-guided therapeutic paracentesis performed yielding 6.7 liters of slightly hazy, yellow fluid. No immediate complications. The pt will receive IV albumin postprocedure.

## 2017-10-22 ENCOUNTER — Telehealth: Payer: Self-pay | Admitting: Hematology and Oncology

## 2017-10-22 ENCOUNTER — Ambulatory Visit (HOSPITAL_COMMUNITY): Payer: BLUE CROSS/BLUE SHIELD

## 2017-10-22 NOTE — Telephone Encounter (Signed)
Faxed medical records to Renaee Munda with [facility] @ [phone]. Release ID# [id number]

## 2017-10-23 ENCOUNTER — Ambulatory Visit (HOSPITAL_COMMUNITY)
Admission: RE | Admit: 2017-10-23 | Discharge: 2017-10-23 | Disposition: A | Discharge status: Home / Self Care | Payer: BLUE CROSS/BLUE SHIELD | Source: Ambulatory Visit | Attending: Hematology and Oncology | Admitting: Hematology and Oncology

## 2017-10-23 ENCOUNTER — Other Ambulatory Visit: Payer: Self-pay | Admitting: Hematology and Oncology

## 2017-10-23 ENCOUNTER — Encounter (HOSPITAL_COMMUNITY)
Admission: RE | Admit: 2017-10-23 | Discharge: 2017-10-23 | Disposition: A | Discharge status: Home / Self Care | Payer: BLUE CROSS/BLUE SHIELD | Source: Ambulatory Visit | Attending: Hematology and Oncology | Admitting: Hematology and Oncology

## 2017-10-23 ENCOUNTER — Encounter (HOSPITAL_COMMUNITY): Payer: BLUE CROSS/BLUE SHIELD

## 2017-10-23 DIAGNOSIS — R160 Hepatomegaly, not elsewhere classified: Secondary | ICD-10-CM | POA: Insufficient documentation

## 2017-10-23 DIAGNOSIS — C50412 Malignant neoplasm of upper-outer quadrant of left female breast: Secondary | ICD-10-CM | POA: Diagnosis present

## 2017-10-23 DIAGNOSIS — N1330 Unspecified hydronephrosis: Secondary | ICD-10-CM | POA: Insufficient documentation

## 2017-10-23 DIAGNOSIS — R590 Localized enlarged lymph nodes: Secondary | ICD-10-CM | POA: Diagnosis not present

## 2017-10-23 DIAGNOSIS — Z170 Estrogen receptor positive status [ER+]: Secondary | ICD-10-CM

## 2017-10-23 DIAGNOSIS — C801 Malignant (primary) neoplasm, unspecified: Principal | ICD-10-CM

## 2017-10-23 DIAGNOSIS — C7951 Secondary malignant neoplasm of bone: Secondary | ICD-10-CM | POA: Diagnosis not present

## 2017-10-23 DIAGNOSIS — K769 Liver disease, unspecified: Secondary | ICD-10-CM | POA: Diagnosis not present

## 2017-10-23 DIAGNOSIS — C786 Secondary malignant neoplasm of retroperitoneum and peritoneum: Secondary | ICD-10-CM

## 2017-10-23 MED ORDER — IOPAMIDOL (ISOVUE-300) INJECTION 61%
INTRAVENOUS | Status: AC
  Filled 2017-10-23: qty 30

## 2017-10-23 MED ORDER — IOPAMIDOL (ISOVUE-300) INJECTION 61%
30.0000 mL | Freq: Once | INTRAVENOUS | Status: AC | PRN
  Administered 2017-10-23: 30 mL via ORAL

## 2017-10-25 ENCOUNTER — Ambulatory Visit (HOSPITAL_COMMUNITY)
Admission: RE | Admit: 2017-10-25 | Discharge: 2017-10-25 | Disposition: A | Discharge status: Home / Self Care | Payer: BLUE CROSS/BLUE SHIELD | Source: Ambulatory Visit | Attending: Hematology and Oncology | Admitting: Hematology and Oncology

## 2017-10-25 ENCOUNTER — Encounter (HOSPITAL_COMMUNITY): Payer: Self-pay | Admitting: Student

## 2017-10-25 ENCOUNTER — Other Ambulatory Visit: Payer: Self-pay

## 2017-10-25 DIAGNOSIS — Z170 Estrogen receptor positive status [ER+]: Principal | ICD-10-CM

## 2017-10-25 DIAGNOSIS — R180 Malignant ascites: Secondary | ICD-10-CM | POA: Insufficient documentation

## 2017-10-25 DIAGNOSIS — C801 Malignant (primary) neoplasm, unspecified: Secondary | ICD-10-CM

## 2017-10-25 DIAGNOSIS — C786 Secondary malignant neoplasm of retroperitoneum and peritoneum: Secondary | ICD-10-CM

## 2017-10-25 DIAGNOSIS — C50412 Malignant neoplasm of upper-outer quadrant of left female breast: Secondary | ICD-10-CM

## 2017-10-25 HISTORY — PX: IR PARACENTESIS: IMG2679

## 2017-10-25 MED ORDER — LIDOCAINE HCL (PF) 2 % IJ SOLN
INTRAMUSCULAR | Status: DC | PRN
  Administered 2017-10-25: 10 mL

## 2017-10-25 MED ORDER — LIDOCAINE HCL (PF) 2 % IJ SOLN
INTRAMUSCULAR | Status: AC
  Filled 2017-10-25: qty 20

## 2017-10-25 MED ORDER — ALBUMIN HUMAN 25 % IV SOLN
25.0000 g | Freq: Once | INTRAVENOUS | Status: DC
  Filled 2017-10-25: qty 100

## 2017-10-25 NOTE — Procedures (Signed)
PROCEDURE SUMMARY:  Successful US guided paracentesis from left lateral abdomen.  Yielded 3.4 liters of yellow fluid.  No immediate complications.  Pt tolerated well.   Specimen was not sent for labs. Patient to receive albumin after procedure today.   Docia Barrier PA-C 10/25/2017 3:27 PM

## 2017-10-28 ENCOUNTER — Inpatient Hospital Stay: Payer: BLUE CROSS/BLUE SHIELD | Attending: Hematology and Oncology

## 2017-10-28 ENCOUNTER — Telehealth: Payer: Self-pay | Admitting: Hematology and Oncology

## 2017-10-28 ENCOUNTER — Other Ambulatory Visit: Payer: Self-pay

## 2017-10-28 ENCOUNTER — Inpatient Hospital Stay: Payer: BLUE CROSS/BLUE SHIELD | Admitting: Lab

## 2017-10-28 ENCOUNTER — Inpatient Hospital Stay: Payer: BLUE CROSS/BLUE SHIELD | Admitting: Hematology and Oncology

## 2017-10-28 ENCOUNTER — Encounter (HOSPITAL_COMMUNITY): Payer: Self-pay | Admitting: Hematology and Oncology

## 2017-10-28 VITALS — BP 103/76 | HR 105 | Temp 97.9°F | Resp 18 | Ht 68.5 in | Wt 120.2 lb

## 2017-10-28 DIAGNOSIS — Z90710 Acquired absence of both cervix and uterus: Secondary | ICD-10-CM | POA: Diagnosis not present

## 2017-10-28 DIAGNOSIS — R110 Nausea: Secondary | ICD-10-CM | POA: Diagnosis not present

## 2017-10-28 DIAGNOSIS — R180 Malignant ascites: Secondary | ICD-10-CM | POA: Insufficient documentation

## 2017-10-28 DIAGNOSIS — J910 Malignant pleural effusion: Secondary | ICD-10-CM

## 2017-10-28 DIAGNOSIS — D649 Anemia, unspecified: Secondary | ICD-10-CM

## 2017-10-28 DIAGNOSIS — D72819 Decreased white blood cell count, unspecified: Secondary | ICD-10-CM | POA: Diagnosis not present

## 2017-10-28 DIAGNOSIS — C786 Secondary malignant neoplasm of retroperitoneum and peritoneum: Secondary | ICD-10-CM | POA: Diagnosis not present

## 2017-10-28 DIAGNOSIS — R5383 Other fatigue: Secondary | ICD-10-CM | POA: Diagnosis not present

## 2017-10-28 DIAGNOSIS — R630 Anorexia: Secondary | ICD-10-CM | POA: Diagnosis not present

## 2017-10-28 DIAGNOSIS — N1330 Unspecified hydronephrosis: Secondary | ICD-10-CM | POA: Diagnosis not present

## 2017-10-28 DIAGNOSIS — Z170 Estrogen receptor positive status [ER+]: Secondary | ICD-10-CM | POA: Diagnosis not present

## 2017-10-28 DIAGNOSIS — Z923 Personal history of irradiation: Secondary | ICD-10-CM

## 2017-10-28 DIAGNOSIS — Z9012 Acquired absence of left breast and nipple: Secondary | ICD-10-CM | POA: Diagnosis not present

## 2017-10-28 DIAGNOSIS — Z90722 Acquired absence of ovaries, bilateral: Secondary | ICD-10-CM | POA: Diagnosis not present

## 2017-10-28 DIAGNOSIS — Z9013 Acquired absence of bilateral breasts and nipples: Secondary | ICD-10-CM | POA: Diagnosis not present

## 2017-10-28 DIAGNOSIS — Z79811 Long term (current) use of aromatase inhibitors: Secondary | ICD-10-CM

## 2017-10-28 DIAGNOSIS — C50412 Malignant neoplasm of upper-outer quadrant of left female breast: Secondary | ICD-10-CM | POA: Insufficient documentation

## 2017-10-28 DIAGNOSIS — C801 Malignant (primary) neoplasm, unspecified: Principal | ICD-10-CM

## 2017-10-28 DIAGNOSIS — C7951 Secondary malignant neoplasm of bone: Secondary | ICD-10-CM | POA: Diagnosis not present

## 2017-10-28 DIAGNOSIS — Z79899 Other long term (current) drug therapy: Secondary | ICD-10-CM | POA: Diagnosis not present

## 2017-10-28 DIAGNOSIS — Z9221 Personal history of antineoplastic chemotherapy: Secondary | ICD-10-CM | POA: Insufficient documentation

## 2017-10-28 LAB — CMP (CANCER CENTER ONLY)
ALK PHOS: 103 U/L (ref 38–126)
ALT: 8 U/L (ref 0–44)
AST: 17 U/L (ref 15–41)
Albumin: 1.5 g/dL — ABNORMAL LOW (ref 3.5–5.0)
Anion gap: 5 (ref 5–15)
BUN: 13 mg/dL (ref 6–20)
CALCIUM: 8.7 mg/dL — AB (ref 8.9–10.3)
CO2: 30 mmol/L (ref 22–32)
Chloride: 99 mmol/L (ref 98–111)
Creatinine: 0.78 mg/dL (ref 0.44–1.00)
GFR, Est AFR Am: >60 mL/min (ref >60)
Glucose, Bld: 103 mg/dL — ABNORMAL HIGH (ref 70–99)
Potassium: 4.6 mmol/L (ref 3.5–5.1)
Sodium: 134 mmol/L — ABNORMAL LOW (ref 135–145)
Total Bilirubin: <0.2 mg/dL — ABNORMAL LOW (ref 0.3–1.2)
Total Protein: 5.6 g/dL — ABNORMAL LOW (ref 6.5–8.1)

## 2017-10-28 LAB — CBC WITH DIFFERENTIAL (CANCER CENTER ONLY)
Basophils Absolute: 0 10*3/uL (ref 0.0–0.1)
Basophils Relative: 2 %
EOS ABS: 0 10*3/uL (ref 0.0–0.5)
EOS PCT: 0 %
HCT: 27.6 % — ABNORMAL LOW (ref 34.8–46.6)
Hemoglobin: 8.6 g/dL — ABNORMAL LOW (ref 11.6–15.9)
LYMPHS ABS: 1 10*3/uL (ref 0.9–3.3)
Lymphocytes Relative: 35 %
MCH: 29.1 pg (ref 25.1–34.0)
MCHC: 31.2 g/dL — AB (ref 31.5–36.0)
MCV: 93.2 fL (ref 79.5–101.0)
MONOS PCT: 7 %
Monocytes Absolute: 0.2 10*3/uL (ref 0.1–0.9)
Neutro Abs: 1.5 10*3/uL (ref 1.5–6.5)
Neutrophils Relative %: 56 %
PLATELETS: 439 10*3/uL — AB (ref 145–400)
RBC: 2.96 MIL/uL — ABNORMAL LOW (ref 3.70–5.45)
RDW: 23.2 % — ABNORMAL HIGH (ref 11.2–14.5)
WBC: 2.8 10*3/uL — AB (ref 3.9–10.3)

## 2017-10-28 LAB — ABO/RH: ABO/RH(D): B POS

## 2017-10-28 LAB — PREPARE RBC (CROSSMATCH)

## 2017-10-28 MED ORDER — PALBOCICLIB 75 MG PO CAPS: ORAL_CAPSULE | ORAL | 3 refills | Status: AC

## 2017-10-28 NOTE — Progress Notes (Signed)
Patient Care Team: [name], MD as PCP - General (Hematology and Oncology) [name], MD as Consulting Physician (Hematology and Oncology)  DIAGNOSIS:  Encounter Diagnoses  Name Primary?  . 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 Malignant neoplasm of upper-outer quadrant of left breast in female, estrogen receptor positive ([place])     SUMMARY OF ONCOLOGIC HISTORY:   Breast cancer of upper-outer quadrant of left female breast ([place])   01/18/2002 Initial Diagnosis    Breast cancer of upper-outer quadrant of left female breast; left mastectomy 2/26 lymph nodes positive ER positive received adjuvant TAC chemotherapy followed by radiation and tamoxifen for 9 months stopped for side effects      01/19/2011 Relapse/Recurrence    Right breast augmentation surgery was attempted by Dr. Truman Hayward and was found to have a lesion in the upper outer quadrant 3.3 x 1.8 cm biopsy revealed invasive ductal carcinoma with lobular features ER 100% PR 88% HER-2 negative      02/06/2011 Surgery    Right breast mastectomy at [facility] and axillary lymph node dissection with removal of breast implant followed by immediate reconstruction with tissue expander 36 lymph nodes were positive received radiation therapy and no chemotherapy      03/21/2011 - 04/20/2011 Radiation Therapy    Radiation to chest wall and axilla      05/29/2011 - 12/31/2011 Anti-estrogen oral therapy    Patient received Arimidex for one week and Zoladex x5 months stop Arimidex due to side effects      07/23/2013 Relapse/Recurrence    2 years of back pain and abdominal symptoms with spotting after intercourse revealed uterus enlarged biopsy revealed metastatic breast cancer lobular type: ER positive PR negative      09/18/2013 Surgery    Hysterectomy and bilateral salpingo[facility]oophorectomy at [facility] revealed metastatic breast cancer involving tubes and uterus      10/13/2013 - 10/20/2013 Anti-estrogen oral therapy    Femara for 30 days stopped for  patchy alopecia and patient going for second and third opinions between [place], [place], [place] regarding the appropriate antiestrogen plan (stopped by choice)      02/02/2015 Imaging    Multiple osseous metastases are present. Foci of enhancement are described at C7, T2, T6, T7, T9, and T10      07/29/2017 Imaging    ED visit for abdominal distention: CT abdomen pelvis: Significant ascites with omental caking and scattered areas of peritoneal enhancement/nodularity consistent with peritoneal carcinomatosis, osseous metastases, wall thickening rectosigmoid colon due to serosal implants      07/30/2017 Procedure    Paracentesis: Malignant cells consistent with adenocarcinoma ER positive PR negative GATA3 and GCDFP Positive; consistent with metastatic breast cancer, HER-2 pending      08/05/2017 -  Anti-estrogen oral therapy    Ibrance with letrozole      10/28/2017 Miscellaneous    Foundation 1 analysis: K-ras G12-V; PIK3CA N345K, CHEK2 T331f 15, KRAS G12D, PPP2R1A R183W; TMB and MSI: Could not be determined because of insufficient tissue       CHIEF COMPLIANT: Follow-up to discuss results of the recently performed CT scans  INTERVAL HISTORY: DJamae Tisonis a 62year old with above-mentioned history metastatic breast cancer with a peritoneal carcinomatosis on palbociclib and letrozole.  She had recent scans which showed mild progression of disease.  She is here today to discuss her treatment plan.  She has been requiring paracentesis every 7 to 10 days.  Her biggest issue is related to complete lack of energy.    REVIEW  OF SYSTEMS:   Constitutional: Profound fatigue Eyes: Denies blurriness of vision Ears, nose, mouth, throat, and face: Denies mucositis or sore throat Respiratory: Shortness of breath with exertion Cardiovascular: Denies palpitation, chest discomfort Gastrointestinal:  Denies nausea, heartburn or change in bowel habits Skin: Denies abnormal skin rashes Lymphatics:  Denies new lymphadenopathy or easy bruising Neurological:Denies numbness, tingling or new weaknesses Behavioral/Psych: Mood is stable, no new changes  Extremities: No lower extremity edema  All other systems were reviewed with the patient and are negative.  I have reviewed the past medical history, past surgical history, social history and family history with the patient and they are unchanged from previous note.  ALLERGIES:  has No Known Allergies.  MEDICATIONS:  Current Outpatient Medications  Medication Sig Dispense Refill  . alum & mag hydroxide-simeth (GELUSIL) 315-176-16 MG chewable tablet Chew 3 mLs by mouth every 6 (six) hours as needed for indigestion or heartburn.    . Aspirin Effervescent (ALKA-SELTZER ORIGINAL PO) Take 2 tablets by mouth daily as needed (upset stomach).    . docusate sodium (COLACE) 100 MG capsule Take 1 capsule (100 mg total) by mouth 2 (two) times daily. 10 capsule 0  . letrozole (FEMARA) 2.5 MG tablet Take 1 tablet (2.5 mg total) by mouth daily. 90 tablet 3  . Liniments (SALONPAS PAIN RELIEF PATCH EX) Apply 1 patch topically daily as needed (pain).    . LORazepam (ATIVAN) 1 MG tablet Take 1 tablet (1 mg total) by mouth every 8 (eight) hours as needed for anxiety (or nausea). 10 tablet 0  . ondansetron (ZOFRAN) 4 MG tablet Take 1 tablet (4 mg total) by mouth every 6 (six) hours as needed for nausea. 20 tablet 0  . palbociclib (IBRANCE) 75 MG capsule Take 1 capsule (164m) by mouth once daily, mid-morning, with food. Take for 21 days on, 7 days off, repeat every 28 days. 21 capsule 3  . polyethylene glycol (MIRALAX / GLYCOLAX) packet Take 17 g by mouth daily. 14 each 0   No current facility-administered medications for this visit.     PHYSICAL EXAMINATION: ECOG PERFORMANCE STATUS: 1 - Symptomatic but completely ambulatory  Vitals:   10/28/17 1407  BP: 103/76  Pulse: (!) 105  Resp: 18  Temp: 97.9 F (36.6 C)  SpO2: 100%   Filed Weights   10/28/17  1407  Weight: 120 lb 3.2 oz (54.5 kg)    GENERAL:alert, no distress and comfortable SKIN: skin color, texture, turgor are normal, no rashes or significant lesions EYES: normal, Conjunctiva are pink and non-injected, sclera clear OROPHARYNX:no exudate, no erythema and lips, buccal mucosa, and tongue normal  NECK: supple, thyroid normal size, non-tender, without nodularity LYMPH:  no palpable lymphadenopathy in the cervical, axillary or inguinal LUNGS: clear to auscultation and percussion with normal breathing effort HEART: regular rate & rhythm and no murmurs and no lower extremity edema ABDOMEN:abdomen soft, non-tender and normal bowel sounds MUSCULOSKELETAL:no cyanosis of digits and no clubbing  NEURO: alert & oriented x 3 with fluent speech, no focal motor/sensory deficits EXTREMITIES: No lower extremity edema  LABORATORY DATA:  I have reviewed the data as listed CMP Latest Ref Rng & Units 10/28/2017 09/23/2017 09/07/2017  Glucose 70 - 99 mg/dL 103(H) 96 87  BUN 6 - 20 mg/dL 13 13 19   Creatinine 0.44 - 1.00 mg/dL 0.78 0.81 0.67  Sodium 135 - 145 mmol/L 134(L) 139 140  Potassium 3.5 - 5.1 mmol/L 4.6 4.0 4.0  Chloride 98 - 111 mmol/L 99 104 108  CO2 22 - 32 mmol/L 30 30(H) 22  Calcium 8.9 - 10.3 mg/dL 8.7(L) 8.8 8.3(L)  Total Protein 6.5 - 8.1 g/dL 5.6(L) 5.8(L) -  Total Bilirubin 0.3 - 1.2 mg/dL <0.2(L) 0.2 -  Alkaline Phos 38 - 126 U/L 103 104 -  AST 15 - 41 U/L 17 14 -  ALT 0 - 44 U/L 8 <6 -    Lab Results  Component Value Date   WBC 2.8 (L) 10/28/2017   HGB 8.6 (L) 10/28/2017   HCT 27.6 (L) 10/28/2017   MCV 93.2 10/28/2017   PLT 439 (H) 10/28/2017   NEUTROABS 1.5 10/28/2017    ASSESSMENT & PLAN:  Breast cancer of upper-outer quadrant of left female breast ([place]) Metastatic breast cancer with previous bone metastases and newly diagnosed omental/peritoneal carcinomatosis with massive ascites 07/29/2017 Previously she refused antiestrogen therapy.  07/30/2017: Therapeutic  paracentesis: Malignant ascites consistent with adenocarcinoma ER positive PR negative HER-2 negative  Current treatment: Antiestrogen therapywith letrozolealong with CDK 4 and 6 inhibitor like Ibrancestarted 08/05/2017  Toxicities: 1.Anemia: Hemoglobin is 8.5 we will continue to monitor this. 2.leukopenia: ANC 0.6.   Now on 100 mg dosage starting 09/09/2017.  Hospitalization 09/05/2017 to 09/07/2017 due to small bowel obstruction and peritoneal carcinomatosis  CT chest abdomen pelvis 10/23/2017: Mild interval progression of intraperitoneal disease with increased mass-effect on the liver and bowel loops the discrete soft tissue components are less evident on today's exam.  Bilateral hydronephrosis is less apparent.  Stable to diffuse gastric wall thickening as well as sigmoid colon.  Left axillary lymph nodes, extensive bone metastases  Radiology review: I discussed with the patient that the scans showed progression of disease.  She has not had any significant improvement in the number of paracentesis that she is going through either.    Treatment options discussed: I discussed with her that the options include changing to [facility] with letrozole and Faslodex versus chemotherapy versus trying exemestane and Everolimus versus Piqray (Alpelisib) because she has the PIK3CA mutation  After listening to all the options she decided to stay with Ibrance and letrozole and add Faslodex to her treatment. I discussed with her that she will need a port.  She is agreeable. Profound fatigue: I recommended 2 units of PRBC.  Malignant ascites: Once a week paracentesis Severe hypoalbuminemia: I discussed with her about adding protein powder to her diet. I will see her back in 2 weeks for follow-up.      Orders Placed This Encounter  Procedures  . IR Perc Tun Perit Cath W/Port    Standing Status:   Future    Standing Expiration Date:   12/30/2018    Order Specific Question:   Reason for exam:     Answer:   Need IV access for infusions    Order Specific Question:   Is the patient pregnant?    Answer:   No    Order Specific Question:   Preferred Imaging Location?    Answer:   [facility]   The patient has a good understanding of the overall plan. she agrees with it. she will call with any problems that may develop before the next visit here.   Harriette Ohara, MD 10/28/17

## 2017-10-28 NOTE — Assessment & Plan Note (Addendum)
Metastatic breast cancer with previous bone metastases and newly diagnosed omental/peritoneal carcinomatosis with massive ascites 07/29/2017 Previously she refused antiestrogen therapy.  07/30/2017: Therapeutic paracentesis: Malignant ascites consistent with adenocarcinoma ER positive PR negative HER-2 negative  Current treatment: Antiestrogen therapywith letrozolealong with CDK 4 and 6 inhibitor like Ibrancestarted 08/05/2017  Toxicities: 1.Anemia: Hemoglobin is 8.5 we will continue to monitor this. 2.leukopenia: ANC 0.6.   Now on 100 mg dosage starting 09/09/2017.  Hospitalization 09/05/2017 to 09/07/2017 due to small bowel obstruction and peritoneal carcinomatosis  CT chest abdomen pelvis 10/23/2017: Mild interval progression of intraperitoneal disease with increased mass-effect on the liver and bowel loops the discrete soft tissue components are less evident on today's exam.  Bilateral hydronephrosis is less apparent.  Stable to diffuse gastric wall thickening as well as sigmoid colon.  Left axillary lymph nodes, extensive bone metastases  Radiology review: I discussed with the patient that the scans showed progression of disease.  She has not had any significant improvement in the number of paracentesis that she is going through either.    Treatment options discussed: I discussed with her that the options include changing to chemotherapy versus trying exemestane and Everolimus versus Piqray (Alpelisib) because she has the PIK3CA mutation  Alpelisib (Piqray) Counseling: Alpelisib was studied in SOLAR-1 clinical trial: 572 patients postmenopausal woman who progressed on hormonal therapy were randomized to Fulvestrant + Alpelisib vs Fulvestrant. PFS was 11 months versus 5.7 months in patients who had PIK3CA mutation. Common side effects of Piqray are high blood sugar levels, increase in creatinine, diarrhea, rash, decrease in lymphocyte count, elevated liver enzymes, nausea, fatigue, anemia,  increase in lipase, decreased appetite, stomatitis, vomiting, weight loss, low calcium levels, aPTT prolonged, and hair loss.   I will see her back in 4 weeks for follow-up.

## 2017-10-28 NOTE — Telephone Encounter (Signed)
Gave avs however did not know which day to start

## 2017-10-29 ENCOUNTER — Telehealth: Payer: Self-pay | Admitting: Pharmacist

## 2017-10-29 ENCOUNTER — Telehealth: Payer: Self-pay | Admitting: Adult Health

## 2017-10-29 NOTE — Telephone Encounter (Signed)
Oral Oncology Pharmacist Encounter  Received new referral for Piqray (alpelisib) for the treatment of metastatic, hormone-receptor positive breast cancer with known PI3KCA mutation, progressed on initial endocrine based therapy in conjunction with Faslodex, planned duration until disease progression or unacceptable toxicity. Patient noted with mild progression seen on CT chest and abdomen performed 10/23/2017 while on therapy with Ibrance and Femara.  Labs from 10/28/2017 assessed Albumin extremely low No history of diabetes  Current medication list in Epic reviewed, no DDIs with Piqray identified.  Prescription will be sent to appropriate specialty pharmacy once prescribed by MD. Patient receives [facility] from [facility] per insurance requirement.  Oral Oncology Clinic will continue to follow.  Johny Drilling, PharmD, BCPS, BCOP  10/29/2017 2:29 PM Oral Oncology Clinic [phone]

## 2017-10-29 NOTE — Telephone Encounter (Signed)
Oral Oncology Pharmacist Encounter  Received notification that patient will continue on Ibrance plus Femara with the addition of Faslodex at this time. Will not yet start [place].  Johny Drilling, PharmD, BCPS, BCOP  10/29/2017 2:38 PM Oral Oncology Clinic [phone]

## 2017-10-29 NOTE — Telephone Encounter (Signed)
Tried to call regarding schedule °

## 2017-10-30 ENCOUNTER — Telehealth: Payer: Self-pay

## 2017-10-30 ENCOUNTER — Inpatient Hospital Stay: Payer: BLUE CROSS/BLUE SHIELD

## 2017-10-30 DIAGNOSIS — D649 Anemia, unspecified: Secondary | ICD-10-CM

## 2017-10-30 DIAGNOSIS — C50412 Malignant neoplasm of upper-outer quadrant of left female breast: Secondary | ICD-10-CM | POA: Diagnosis not present

## 2017-10-30 MED ORDER — DIPHENHYDRAMINE HCL 25 MG PO CAPS
ORAL_CAPSULE | ORAL | Status: AC
  Filled 2017-10-30: qty 1

## 2017-10-30 MED ORDER — SODIUM CHLORIDE 0.9 % IV SOLN
250.0000 mL | Freq: Once | INTRAVENOUS | Status: AC
  Administered 2017-10-30: 250 mL via INTRAVENOUS

## 2017-10-30 MED ORDER — IBUPROFEN 200 MG PO TABS
400.0000 mg | ORAL_TABLET | Freq: Once | ORAL | Status: AC
  Administered 2017-10-30: 400 mg via ORAL

## 2017-10-30 MED ORDER — DIPHENHYDRAMINE HCL 25 MG PO CAPS
25.0000 mg | ORAL_CAPSULE | Freq: Once | ORAL | Status: AC
  Administered 2017-10-30: 25 mg via ORAL

## 2017-10-30 MED ORDER — ACETAMINOPHEN 325 MG PO TABS
ORAL_TABLET | ORAL | Status: AC
  Filled 2017-10-30: qty 2

## 2017-10-30 MED ORDER — IBUPROFEN 200 MG PO TABS
ORAL_TABLET | ORAL | Status: AC
  Filled 2017-10-30: qty 2

## 2017-10-30 MED ORDER — ACETAMINOPHEN 325 MG PO TABS
650.0000 mg | ORAL_TABLET | Freq: Once | ORAL | Status: AC
  Administered 2017-10-30: 650 mg via ORAL

## 2017-10-30 NOTE — Patient Instructions (Signed)

## 2017-10-30 NOTE — Telephone Encounter (Signed)
Loren RN infusion called to let Dr Lindi Adie know that pt is receiving transfusion today, has a headache and Tylenol not helping, can she take Ibuprofen?  Notified Dr Lindi Adie and he is fine with her receiving Ibuprofen 400 mg po x1.  Notified Herbalist.

## 2017-10-31 LAB — TYPE AND SCREEN
ABO/RH(D): B POS
ANTIBODY SCREEN: NEGATIVE
UNIT DIVISION: 0
Unit division: 0

## 2017-10-31 LAB — BPAM RBC
BLOOD PRODUCT EXPIRATION DATE: 201908032359
Blood Product Expiration Date: 201908032359
ISSUE DATE / TIME: 201907100914
ISSUE DATE / TIME: 201907100914
Unit Type and Rh: 7300
Unit Type and Rh: 7300

## 2017-11-01 ENCOUNTER — Encounter (HOSPITAL_COMMUNITY): Payer: Self-pay | Admitting: Physician Assistant

## 2017-11-01 ENCOUNTER — Ambulatory Visit (HOSPITAL_COMMUNITY)
Admission: RE | Admit: 2017-11-01 | Discharge: 2017-11-01 | Disposition: A | Discharge status: Home / Self Care | Payer: BLUE CROSS/BLUE SHIELD | Source: Ambulatory Visit | Attending: Hematology and Oncology | Admitting: Hematology and Oncology

## 2017-11-01 DIAGNOSIS — Z853 Personal history of malignant neoplasm of breast: Secondary | ICD-10-CM | POA: Insufficient documentation

## 2017-11-01 DIAGNOSIS — C786 Secondary malignant neoplasm of retroperitoneum and peritoneum: Secondary | ICD-10-CM

## 2017-11-01 DIAGNOSIS — C801 Malignant (primary) neoplasm, unspecified: Secondary | ICD-10-CM

## 2017-11-01 DIAGNOSIS — R180 Malignant ascites: Secondary | ICD-10-CM | POA: Insufficient documentation

## 2017-11-01 HISTORY — PX: IR PARACENTESIS: IMG2679

## 2017-11-01 MED ORDER — ALBUMIN HUMAN 25 % IV SOLN
25.0000 g | Freq: Once | INTRAVENOUS | Status: AC
  Administered 2017-11-01: 25 g via INTRAVENOUS
  Filled 2017-11-01: qty 100

## 2017-11-01 MED ORDER — LIDOCAINE HCL 2 % IJ SOLN
INTRAMUSCULAR | Status: AC | PRN
  Administered 2017-11-01: 10 mL

## 2017-11-01 MED ORDER — LIDOCAINE HCL (PF) 2 % IJ SOLN
INTRAMUSCULAR | Status: AC
  Filled 2017-11-01: qty 20

## 2017-11-01 MED ORDER — ALBUMIN HUMAN 25 % IV SOLN
INTRAVENOUS | Status: AC
  Filled 2017-11-01: qty 100

## 2017-11-01 NOTE — Procedures (Signed)
PROCEDURE SUMMARY:  Successful image-guided paracentesis from the right abdomen.  Yielded 3.8 liters of clear yellow fluid.  No immediate complications.  Patient tolerated well.   Specimen was not sent for labs.  Joaquim Nam PA-C 11/01/2017 2:00 PM

## 2017-11-04 ENCOUNTER — Other Ambulatory Visit: Payer: Self-pay | Admitting: Hematology and Oncology

## 2017-11-07 ENCOUNTER — Ambulatory Visit (HOSPITAL_COMMUNITY)
Admission: RE | Admit: 2017-11-07 | Discharge: 2017-11-07 | Disposition: A | Discharge status: Home / Self Care | Payer: BLUE CROSS/BLUE SHIELD | Source: Ambulatory Visit | Attending: Hematology and Oncology | Admitting: Hematology and Oncology

## 2017-11-07 ENCOUNTER — Encounter (HOSPITAL_COMMUNITY): Payer: Self-pay

## 2017-11-07 ENCOUNTER — Other Ambulatory Visit: Payer: Self-pay | Admitting: Hematology and Oncology

## 2017-11-07 DIAGNOSIS — Z90710 Acquired absence of both cervix and uterus: Secondary | ICD-10-CM | POA: Diagnosis not present

## 2017-11-07 DIAGNOSIS — Z170 Estrogen receptor positive status [ER+]: Secondary | ICD-10-CM | POA: Insufficient documentation

## 2017-11-07 DIAGNOSIS — Z87891 Personal history of nicotine dependence: Secondary | ICD-10-CM | POA: Insufficient documentation

## 2017-11-07 DIAGNOSIS — Z800 Family history of malignant neoplasm of digestive organs: Secondary | ICD-10-CM | POA: Diagnosis not present

## 2017-11-07 DIAGNOSIS — Z83438 Family history of other disorder of lipoprotein metabolism and other lipidemia: Secondary | ICD-10-CM | POA: Diagnosis not present

## 2017-11-07 DIAGNOSIS — C50412 Malignant neoplasm of upper-outer quadrant of left female breast: Secondary | ICD-10-CM

## 2017-11-07 DIAGNOSIS — Z9013 Acquired absence of bilateral breasts and nipples: Secondary | ICD-10-CM | POA: Diagnosis not present

## 2017-11-07 DIAGNOSIS — M8580 Other specified disorders of bone density and structure, unspecified site: Secondary | ICD-10-CM | POA: Insufficient documentation

## 2017-11-07 DIAGNOSIS — Z9889 Other specified postprocedural states: Secondary | ICD-10-CM | POA: Insufficient documentation

## 2017-11-07 DIAGNOSIS — Z888 Allergy status to other drugs, medicaments and biological substances status: Secondary | ICD-10-CM | POA: Insufficient documentation

## 2017-11-07 DIAGNOSIS — Z79899 Other long term (current) drug therapy: Secondary | ICD-10-CM | POA: Diagnosis not present

## 2017-11-07 DIAGNOSIS — Z87442 Personal history of urinary calculi: Secondary | ICD-10-CM | POA: Diagnosis not present

## 2017-11-07 HISTORY — PX: IR IMAGING GUIDED PORT INSERTION: IMG5740

## 2017-11-07 LAB — CBC
HEMATOCRIT: 37.4 % (ref 36.0–46.0)
HEMOGLOBIN: 11.9 g/dL — AB (ref 12.0–15.0)
MCH: 29.7 pg (ref 26.0–34.0)
MCHC: 31.8 g/dL (ref 30.0–36.0)
MCV: 93.3 fL (ref 78.0–100.0)
Platelets: 832 10*3/uL — ABNORMAL HIGH (ref 150–400)
RBC: 4.01 MIL/uL (ref 3.87–5.11)
RDW: 19.8 % — ABNORMAL HIGH (ref 11.5–15.5)
WBC: 6.4 10*3/uL (ref 4.0–10.5)

## 2017-11-07 LAB — PROTIME-INR
INR: 1.12
PROTHROMBIN TIME: 14.3 s (ref 11.4–15.2)

## 2017-11-07 LAB — APTT: aPTT: 33 seconds (ref 24–36)

## 2017-11-07 MED ORDER — ONDANSETRON HCL 4 MG/2ML IJ SOLN
4.0000 mg | Freq: Once | INTRAMUSCULAR | Status: AC
  Administered 2017-11-07: 4 mg via INTRAVENOUS
  Filled 2017-11-07: qty 2

## 2017-11-07 MED ORDER — SODIUM CHLORIDE 0.9 % IV SOLN
INTRAVENOUS | Status: DC
  Administered 2017-11-07: 08:00:00 via INTRAVENOUS

## 2017-11-07 MED ORDER — CEFAZOLIN SODIUM-DEXTROSE 2-4 GM/100ML-% IV SOLN
2.0000 g | Freq: Once | INTRAVENOUS | Status: AC
Start: 2017-11-07 — End: 2017-11-07
  Administered 2017-11-07: 2 g via INTRAVENOUS

## 2017-11-07 MED ORDER — LIDOCAINE HCL (PF) 1 % IJ SOLN
INTRAMUSCULAR | Status: AC | PRN
  Administered 2017-11-07: 5 mL

## 2017-11-07 MED ORDER — HEPARIN SOD (PORK) LOCK FLUSH 100 UNIT/ML IV SOLN
INTRAVENOUS | Status: AC | PRN
  Administered 2017-11-07: 500 [IU] via INTRAVENOUS

## 2017-11-07 MED ORDER — MIDAZOLAM HCL 2 MG/2ML IJ SOLN
INTRAMUSCULAR | Status: AC | PRN
  Administered 2017-11-07: 0.5 mg via INTRAVENOUS
  Administered 2017-11-07: 1 mg via INTRAVENOUS
  Administered 2017-11-07: 0.5 mg via INTRAVENOUS

## 2017-11-07 MED ORDER — MIDAZOLAM HCL 2 MG/2ML IJ SOLN
INTRAMUSCULAR | Status: AC
  Filled 2017-11-07: qty 4

## 2017-11-07 MED ORDER — FENTANYL CITRATE (PF) 100 MCG/2ML IJ SOLN
INTRAMUSCULAR | Status: AC | PRN
  Administered 2017-11-07: 50 ug via INTRAVENOUS
  Administered 2017-11-07 (×2): 25 ug via INTRAVENOUS

## 2017-11-07 MED ORDER — LIDOCAINE HCL 1 % IJ SOLN
INTRAMUSCULAR | Status: AC
  Filled 2017-11-07: qty 20

## 2017-11-07 MED ORDER — FENTANYL CITRATE (PF) 100 MCG/2ML IJ SOLN
INTRAMUSCULAR | Status: AC
  Filled 2017-11-07: qty 2

## 2017-11-07 MED ORDER — LIDOCAINE HCL (PF) 1 % IJ SOLN
INTRAMUSCULAR | Status: AC | PRN
  Administered 2017-11-07: 10 mL

## 2017-11-07 MED ORDER — CEFAZOLIN SODIUM-DEXTROSE 2-4 GM/100ML-% IV SOLN
INTRAVENOUS | Status: AC
  Administered 2017-11-07: 2 g via INTRAVENOUS
  Filled 2017-11-07: qty 100

## 2017-11-07 MED ORDER — HEPARIN SOD (PORK) LOCK FLUSH 100 UNIT/ML IV SOLN
INTRAVENOUS | Status: AC
  Filled 2017-11-07: qty 5

## 2017-11-07 NOTE — Procedures (Signed)
Interventional Radiology Procedure Note  Procedure: Single Lumen Power Port Placement    Access:  Right IJ vein.  Findings: Catheter tip positioned at SVC/RA junction. Port is ready for immediate use.   Complications: None  EBL: < 10 mL  Recommendations:  - Ok to shower in 24 hours - Do not submerge for 7 days - Routine line care   Devonia Farro T. Kwane Rohl, M.D Pager:  [phone]   

## 2017-11-07 NOTE — Discharge Instructions (Signed)
Moderate Conscious Sedation, Adult, Care After °These instructions provide you with information about caring for yourself after your procedure. Your health care provider may also give you more specific instructions. Your treatment has been planned according to current medical practices, but problems sometimes occur. Call your health care provider if you have any problems or questions after your procedure. °What can I expect after the procedure? °After your procedure, it is common: °· To feel sleepy for several hours. °· To feel clumsy and have poor balance for several hours. °· To have poor judgment for several hours. °· To vomit if you eat too soon. ° °Follow these instructions at home: °For at least 24 hours after the procedure: ° °· Do not: °? Participate in activities where you could fall or become injured. °? Drive. °? Use heavy machinery. °? Drink alcohol. °? Take sleeping pills or medicines that cause drowsiness. °? Make important decisions or sign legal documents. °? Take care of children on your own. °· Rest. °Eating and drinking °· Follow the diet recommended by your health care provider. °· If you vomit: °? Drink water, juice, or soup when you can drink without vomiting. °? Make sure you have little or no nausea before eating solid foods. °General instructions °· Have a responsible adult stay with you until you are awake and alert. °· Take over-the-counter and prescription medicines only as told by your health care provider. °· If you smoke, do not smoke without supervision. °· Keep all follow-up visits as told by your health care provider. This is important. °Contact a health care provider if: °· You keep feeling nauseous or you keep vomiting. °· You feel light-headed. °· You develop a rash. °· You have a fever. °Get help right away if: °· You have trouble breathing. °This information is not intended to replace advice given to you by your health care provider. Make sure you discuss any questions you have  with your health care provider. °Document Released: 01/28/2013 Document Revised: 09/12/2015 Document Reviewed: 07/30/2015 °Elsevier Interactive Patient Education © 2018 [employer] ° ° °Implanted Port Insertion, Care After °This sheet gives you information about how to care for yourself after your procedure. Your health care provider may also give you more specific instructions. If you have problems or questions, contact your health care provider. °What can I expect after the procedure? °After your procedure, it is common to have: °· Discomfort at the port insertion site. °· Bruising on the skin over the port. This should improve over 3-4 days. ° °Follow these instructions at home: °Port care °· After your port is placed, you will get a manufacturer's information card. The card has information about your port. Keep this card with you at all times. °· Take care of the port as told by your health care provider. Ask your health care provider if you or a family member can get training for taking care of the port at home. A home health care nurse may also take care of the port. °· Make sure to remember what type of port you have. °Incision care °· Follow instructions from your health care provider about how to take care of your port insertion site. Make sure you: °? Wash your hands with soap and water before you change your bandage (dressing). If soap and water are not available, use hand sanitizer. °? Change your dressing as told by your health care provider.  You may remove your dressing tomorrow. °? Leave skin glue in place. These skin closures   may need to stay in place for 2 weeks or longer. If adhesive strip edges start to loosen and curl up, you may trim the loose edges. Do not remove adhesive strips completely unless your health care provider tells you to do that.  DO NOT use EMLA cream for 2 weeks after port placement as this cream will remove the surgical glue on your incision. °· Check your port insertion  site every day for signs of infection. Check for: °? More redness, swelling, or pain. °? More fluid or blood. °? Warmth. °? Pus or a bad smell. °General instructions °· Do not take baths, swim, or use a hot tub until your health care provider approves.  You may shower tomorrow. °· Do not lift anything that is heavier than 10 lb (4.5 kg) for a week, or as told by your health care provider. °· Ask your health care provider when it is okay to: °? Return to work or school. °? Resume usual physical activities or sports. °· Do not drive for 24 hours if you were given a medicine to help you relax (sedative). °· Take over-the-counter and prescription medicines only as told by your health care provider. °· Wear a medical alert bracelet in case of an emergency. This will tell any health care providers that you have a port. °· Keep all follow-up visits as told by your health care provider. This is important. °Contact a health care provider if: °· You have a fever or chills. °· You have more redness, swelling, or pain around your port insertion site. °· You have more fluid or blood coming from your port insertion site. °· Your port insertion site feels warm to the touch. °· You have pus or a bad smell coming from the port insertion site. °Get help right away if: °· You have chest pain or shortness of breath. °· You have bleeding from your port that you cannot control. °Summary °· Take care of the port as told by your health care provider. °· Change your dressing as told by your health care provider. °· Keep all follow-up visits as told by your health care provider. °This information is not intended to replace advice given to you by your health care provider. Make sure you discuss any questions you have with your health care provider. °Document Released: 01/28/2013 Document Revised: 02/29/2016 Document Reviewed: 02/29/2016 °Elsevier Interactive Patient Education © 2017 [employer]. ° °

## 2017-11-07 NOTE — H&P (Signed)
Chief Complaint: Patient was seen in consultation today for port palcement at the request of [facility]  Referring Physician(s): [place]  Supervising Physician: Aletta Edouard  Patient Status: [facility] - Out-pt  History of Present Illness: Kristalyn Bergstresser is a 62 y.o. female with metastatic breast cancer. She is referred for port placement. PMHx, meds, labs, imaging, allergies reviewed. Feels well, no recent fevers, chills, illness. She does have some chronic nausea as she has peritoneal carcinomatosis and recurrent ascites requiring weekly paracentesis Has been NPO today as directed. Family at bedside.   Past Medical History:  Diagnosis Date  . Abnormal pap   . Anemia   . Breast cancer ([place])   . Cervical polyp   . Chronic headaches   . Dermoid   . Fibroid   . Kidney stones   . Osteopenia   . Ovarian cyst   . Paresthesia 04/07/2015  . Urinary urgency     Past Surgical History:  Procedure Laterality Date  . ABDOMINAL HYSTERECTOMY  09/18/13   robotic hyst, bso for metastatic breast cancer, Dr Alycia Rossetti at [facility].  Marland Kitchen BREAST ENHANCEMENT SURGERY    . BREAST LUMPECTOMY     x2  . IR PARACENTESIS  08/12/2017  . IR PARACENTESIS  10/07/2017  . IR PARACENTESIS  10/16/2017  . IR PARACENTESIS  10/25/2017  . IR PARACENTESIS  11/01/2017  . MASTECTOMY Bilateral   . RECONSTRUCTION BREAST W/ TRAM FLAP    . WISDOM TOOTH EXTRACTION      Allergies: Phenergan [promethazine hcl]  Medications: Prior to Admission medications   Medication Sig Start Date End Date Taking? Authorizing Provider  docusate sodium (COLACE) 100 MG capsule Take 1 capsule (100 mg total) by mouth 2 (two) times daily. 09/07/17  Yes Kayleen Memos, DO  ibuprofen (ADVIL,MOTRIN) 200 MG tablet Take 200 mg by mouth every 6 (six) hours as needed.   Yes [provider]  letrozole (FEMARA) 2.5 MG tablet Take 1 tablet (2.5 mg total) by mouth daily. 08/05/17  Yes [name], MD  Liniments (SALONPAS PAIN RELIEF  PATCH EX) Apply 1 patch topically daily as needed (pain).   Yes [provider]  LORazepam (ATIVAN) 1 MG tablet TAKE ONE TABLET BY MOUTH EVERY 8 HOURS AS NEEDED FOR ANXIETY OR NAUSEA 11/04/17  Yes [name], MD  palbociclib (IBRANCE) 75 MG capsule Take 1 capsule (125mg ) by mouth once daily, mid-morning, with food. Take for 21 days on, 7 days off, repeat every 28 days. 10/28/17  Yes [name], MD  polyethylene glycol (MIRALAX / GLYCOLAX) packet Take 17 g by mouth daily. 09/08/17  Yes Kayleen Memos, DO  alum & mag hydroxide-simeth (GELUSIL) 200-200-25 MG chewable tablet Chew 3 mLs by mouth every 6 (six) hours as needed for indigestion or heartburn.    [provider]  Aspirin Effervescent (ALKA-SELTZER ORIGINAL PO) Take 2 tablets by mouth daily as needed (upset stomach).    [provider]  ondansetron (ZOFRAN) 4 MG tablet Take 1 tablet (4 mg total) by mouth every 6 (six) hours as needed for nausea. 09/07/17   Kayleen Memos, DO     Family History  Problem Relation Age of Onset  . Heart disease Father   . Hypertension Mother   . Stomach cancer Paternal Grandmother     Social History   Socioeconomic History  . Marital status: Married    Spouse name: Not on file  . Number of children: 2  . Years of education: master's  . Highest education  level: Not on file  Occupational History  . Occupation: Sports coach  Social Needs  . Financial resource strain: Not on file  . Food insecurity:    Worry: Not on file    Inability: Not on file  . Transportation needs:    Medical: Not on file    Non-medical: Not on file  Tobacco Use  . Smoking status: Former Smoker    Last attempt to quit: 1983    Years since quitting: 36.5  . Smokeless tobacco: Never Used  Substance and Sexual Activity  . Alcohol use: No  . Drug use: No  . Sexual activity: Yes    Birth control/protection: Condom  Lifestyle  . Physical activity:    Days per week: Not on file     Minutes per session: Not on file  . Stress: Not on file  Relationships  . Social connections:    Talks on phone: Not on file    Gets together: Not on file    Attends religious service: Not on file    Active member of club or organization: Not on file    Attends meetings of clubs or organizations: Not on file    Relationship status: Not on file  Other Topics Concern  . Not on file  Social History Narrative   Patient drinks 2 cups of caffeine daily.   Patient right handed.      Review of Systems: A 12 point ROS discussed and pertinent positives are indicated in the HPI above.  All other systems are negative.  Review of Systems  Vital Signs: BP (!) 119/59   Pulse (!) 110   Temp 98.4 F (36.9 C) (Oral)   Resp 16   SpO2 95%   Physical Exam  Constitutional: She is oriented to person, place, and time. She appears well-developed. No distress.  HENT:  Head: Normocephalic.  Mouth/Throat: Oropharynx is clear and moist.  Neck: Normal range of motion. No JVD present. No tracheal deviation present.  Cardiovascular: Normal rate, regular rhythm and normal heart sounds.  Pulmonary/Chest: Effort normal and breath sounds normal. No respiratory distress.  Neurological: She is alert and oriented to person, place, and time.  Skin: Skin is warm and dry.  Psychiatric: She has a normal mood and affect.    Imaging: Ct Abdomen Pelvis Wo Contrast  Result Date: 10/23/2017 CLINICAL DATA:  Metastatic breast cancer with peritoneal carcinomatosis. EXAM: CT CHEST, ABDOMEN AND PELVIS WITHOUT CONTRAST TECHNIQUE: Multidetector CT imaging of the chest, abdomen and pelvis was performed following the standard protocol without IV contrast. COMPARISON:  Abdomen and pelvis CT 09/05/2017. FINDINGS: CT CHEST FINDINGS Cardiovascular: Heart size upper normal. No substantial pericardial effusion. Mediastinum/Nodes: No mediastinal lymphadenopathy. No evidence for gross hilar lymphadenopathy although assessment is  limited by the lack of intravenous contrast on today's study. The esophagus has normal imaging features. No right axillary lymphadenopathy. 11 mm short axis left axillary lymph node evident (2:12). Stranding is noted in the soft tissues of the left axilla. The esophagus has normal imaging features. Lungs/Pleura: The central tracheobronchial airways are patent. Biapical pleuroparenchymal scarring noted, right greater than left. Areas of subsegmental atelectasis noted in the lungs bilaterally. No suspicious pulmonary nodule or mass. Small bilateral pleural effusions. Musculoskeletal: Widespread bony metastases evident with right-sided paraspinal extension at T2-3. metastatic involvement with pathologic compression deformity noted at T7. CT ABDOMEN PELVIS FINDINGS Hepatobiliary: Stable 10 mm low-density lesion anterior left liver. There is no evidence for gallstones, gallbladder wall thickening, or pericholecystic fluid. No  intrahepatic or extrahepatic biliary dilation. Pancreas: No focal mass lesion. No dilatation of the main duct. No intraparenchymal cyst. No peripancreatic edema. Spleen: No splenomegaly. No focal mass lesion. Adrenals/Urinary Tract: No adrenal nodule or mass. No overt hydroureteronephrosis. Bladder is decompressed. Stomach/Bowel: Stomach is nondistended. Diffuse gastric wall thickening persist. Duodenum is normally positioned as is the ligament of Treitz. Persistent distended loops of small bowel identified measuring up to 3.3 cm diameter. Areas of apparent small bowel wall thickening are associated. No overt mechanical small bowel obstruction with oral contrast material identified in the cecum. Possible mild wall thickening in the sigmoid colon. Vascular/Lymphatic: There is abdominal aortic atherosclerosis without aneurysm. No definite abdominal lymphadenopathy although assessment limited by ascites and lack of intravenous contrast material. Reproductive: There is no adnexal mass. Other: Large  volume ascites with interval progression demonstrated by greater degree of mass-effect on the liver small bowel loops and colon. Without intravenous contrast material, the omental and peritoneal disease noted on the prior study is less apparent, but does persist. Musculoskeletal: Widespread lytic metastatic disease identified in the visualized bony anatomy. 19 mm lytic lesion in the T12 vertebral body is similar to prior. IMPRESSION: 1. Mild interval progression of intraperitoneal disease with increased mass-effect on the liver and bowel loops. The discrete soft tissue components identified on the previous study are less evident on today's exam without intravenous contrast material. 2. Bilateral hydronephrosis seen previously is less apparent today. 3. Similar appearance of diffuse gastric wall thickening and wall thickening in the sigmoid colon. 4. Left axillary lymphadenopathy. 5. Widespread bony metastatic involvement, similar. Electronically Signed   By: Misty Stanley M.D.   On: 10/23/2017 14:26   Ct Chest Wo Contrast  Result Date: 10/23/2017 CLINICAL DATA:  Metastatic breast cancer with peritoneal carcinomatosis. EXAM: CT CHEST, ABDOMEN AND PELVIS WITHOUT CONTRAST TECHNIQUE: Multidetector CT imaging of the chest, abdomen and pelvis was performed following the standard protocol without IV contrast. COMPARISON:  Abdomen and pelvis CT 09/05/2017. FINDINGS: CT CHEST FINDINGS Cardiovascular: Heart size upper normal. No substantial pericardial effusion. Mediastinum/Nodes: No mediastinal lymphadenopathy. No evidence for gross hilar lymphadenopathy although assessment is limited by the lack of intravenous contrast on today's study. The esophagus has normal imaging features. No right axillary lymphadenopathy. 11 mm short axis left axillary lymph node evident (2:12). Stranding is noted in the soft tissues of the left axilla. The esophagus has normal imaging features. Lungs/Pleura: The central tracheobronchial airways  are patent. Biapical pleuroparenchymal scarring noted, right greater than left. Areas of subsegmental atelectasis noted in the lungs bilaterally. No suspicious pulmonary nodule or mass. Small bilateral pleural effusions. Musculoskeletal: Widespread bony metastases evident with right-sided paraspinal extension at T2-3. metastatic involvement with pathologic compression deformity noted at T7. CT ABDOMEN PELVIS FINDINGS Hepatobiliary: Stable 10 mm low-density lesion anterior left liver. There is no evidence for gallstones, gallbladder wall thickening, or pericholecystic fluid. No intrahepatic or extrahepatic biliary dilation. Pancreas: No focal mass lesion. No dilatation of the main duct. No intraparenchymal cyst. No peripancreatic edema. Spleen: No splenomegaly. No focal mass lesion. Adrenals/Urinary Tract: No adrenal nodule or mass. No overt hydroureteronephrosis. Bladder is decompressed. Stomach/Bowel: Stomach is nondistended. Diffuse gastric wall thickening persist. Duodenum is normally positioned as is the ligament of Treitz. Persistent distended loops of small bowel identified measuring up to 3.3 cm diameter. Areas of apparent small bowel wall thickening are associated. No overt mechanical small bowel obstruction with oral contrast material identified in the cecum. Possible mild wall thickening in the sigmoid colon. Vascular/Lymphatic: There is  abdominal aortic atherosclerosis without aneurysm. No definite abdominal lymphadenopathy although assessment limited by ascites and lack of intravenous contrast material. Reproductive: There is no adnexal mass. Other: Large volume ascites with interval progression demonstrated by greater degree of mass-effect on the liver small bowel loops and colon. Without intravenous contrast material, the omental and peritoneal disease noted on the prior study is less apparent, but does persist. Musculoskeletal: Widespread lytic metastatic disease identified in the visualized bony  anatomy. 19 mm lytic lesion in the T12 vertebral body is similar to prior. IMPRESSION: 1. Mild interval progression of intraperitoneal disease with increased mass-effect on the liver and bowel loops. The discrete soft tissue components identified on the previous study are less evident on today's exam without intravenous contrast material. 2. Bilateral hydronephrosis seen previously is less apparent today. 3. Similar appearance of diffuse gastric wall thickening and wall thickening in the sigmoid colon. 4. Left axillary lymphadenopathy. 5. Widespread bony metastatic involvement, similar. Electronically Signed   By: Misty Stanley M.D.   On: 10/23/2017 14:26   Ir Paracentesis  Result Date: 11/01/2017 INDICATION: Patient with history of breast cancer and malignant ascites. Request is made for therapeutic paracentesis. Patient received albumin with procedure today. EXAM: ULTRASOUND GUIDED THERAPEUTIC PARACENTESIS MEDICATIONS: 10 mL of 2% lidocaine COMPLICATIONS: None immediate. PROCEDURE: Informed written consent was obtained from the patient after a discussion of the risks, benefits and alternatives to treatment. A timeout was performed prior to the initiation of the procedure. Initial ultrasound scanning demonstrates a moderate amount of ascites within the right lower abdominal quadrant. The right lower abdomen was prepped and draped in the usual sterile fashion. 2% lidocaine was used for local anesthesia. Following this, a 19 gauge, 7-cm, Yueh catheter was introduced. An ultrasound image was saved for documentation purposes. The paracentesis was performed. The catheter was removed and a dressing was applied. The patient tolerated the procedure well without immediate post procedural complication. FINDINGS: A total of approximately 3.8 L of clear yellow fluid was removed. IMPRESSION: Successful ultrasound-guided paracentesis yielding 3.8 L of peritoneal fluid. Read by: Earley Abide, PA-C Electronically Signed    By: Aletta Edouard M.D.   On: 11/01/2017 14:16   Ir Paracentesis  Result Date: 10/25/2017 INDICATION: Patient with malignant ascites. Request made for therapeutic paracentesis. Patient to receive albumin with procedure today. EXAM: ULTRASOUND GUIDED THERAPEUTIC PARACENTESIS MEDICATIONS: 10 mL 2% lidocaine COMPLICATIONS: None immediate. PROCEDURE: Informed written consent was obtained from the patient after a discussion of the risks, benefits and alternatives to treatment. A timeout was performed prior to the initiation of the procedure. Initial ultrasound scanning demonstrates a moderate amount of ascites within the left lateral abdomen. The left lateral abdomen was prepped and draped in the usual sterile fashion. 2% lidocaine was used for local anesthesia. Following this, a 19 gauge, 7-cm, Yueh catheter was introduced. An ultrasound image was saved for documentation purposes. The paracentesis was performed. The catheter was removed and a dressing was applied. The patient tolerated the procedure well without immediate post procedural complication. FINDINGS: A total of approximately 3.4 liters of yellow fluid was removed. IMPRESSION: Successful ultrasound-guided therapeutic paracentesis yielding 3.4 liters of peritoneal fluid. Read by: Brynda Greathouse PA-C Electronically Signed   By: Marybelle Killings M.D.   On: 10/25/2017 15:32   Ir Paracentesis  Result Date: 10/16/2017 INDICATION: Pt with history of breast cancer and recurrent malignant ascites. Request made for therapeutic paracentesis. EXAM: ULTRASOUND GUIDED THERAPEUTIC PARACENTESIS MEDICATIONS: None COMPLICATIONS: None immediate. PROCEDURE: Informed written consent was obtained from  the patient after a discussion of the risks, benefits and alternatives to treatment. A timeout was performed prior to the initiation of the procedure. Initial ultrasound scanning demonstrates a large amount of ascites within the left lower abdominal quadrant. The left lower  abdomen was prepped and draped in the usual sterile fashion. 2% lidocaine was used for local anesthesia. Following this, a 19 gauge, 7-cm, Yueh catheter was introduced. An ultrasound image was saved for documentation purposes. The paracentesis was performed. The catheter was removed and a dressing was applied. The patient tolerated the procedure well without immediate post procedural complication. FINDINGS: A total of approximately 6.7 liters of slightly hazy, yellow fluid was removed. IMPRESSION: Successful ultrasound-guided therapeutic paracentesis yielding 6.7 liters of peritoneal fluid. The patient will receive IV albumin postprocedure. Read by: Rowe Robert, PA-C Electronically Signed   By: Corrie Mckusick D.O.   On: 10/16/2017 10:16    Labs:  CBC: Recent Labs    09/07/17 0333 09/23/17 1336 10/28/17 1353 11/07/17 0749  WBC 4.4 3.4* 2.8* 6.4  HGB 8.4* 9.4* 8.6* 11.9*  HCT 26.9* 28.8* 27.6* 37.4  PLT 495* 494* 439* 832*    COAGS: Recent Labs    11/07/17 0749  INR 1.12  APTT 33    BMP: Recent Labs    09/06/17 0412 09/07/17 0333 09/23/17 1336 10/28/17 1353  NA 140 140 139 134*  K 4.0 4.0 4.0 4.6  CL 106 108 104 99  CO2 26 22 30* 30  GLUCOSE 93 87 96 103*  BUN 15 19 13 13   CALCIUM 8.7* 8.3* 8.8 8.7*  CREATININE 0.69 0.67 0.81 0.78  GFRNONAA >60 >60 >60 >60  GFRAA >60 >60 >60 >60    LIVER FUNCTION TESTS: Recent Labs    09/02/17 1442 09/05/17 1526 09/23/17 1336 10/28/17 1353  BILITOT <0.2* 0.3 0.2 <0.2*  AST 13 17 14 17   ALT <6 8* <6 8  ALKPHOS 97 100 104 103  PROT 5.5* 6.1* 5.8* 5.6*  ALBUMIN 2.1* 2.6* 2.4* 1.5*    TUMOR MARKERS: No results for input(s): AFPTM, CEA, CA199, CHROMGRNA in the last 8760 hours.  Assessment and Plan: Metastatic breast cancer For port placement Labs ok Risks and benefits of image guided port-a-catheter placement was discussed with the patient including, but not limited to bleeding, infection, pneumothorax, or fibrin sheath  development and need for additional procedures.  All of the patient's questions were answered, patient is agreeable to proceed. Consent signed and in chart.    Thank you for this interesting consult.  I greatly enjoyed meeting Dacota Devall and look forward to participating in their care.  A copy of this report was sent to the requesting provider on this date.  Electronically Signed: Ascencion Dike, PA-C 11/07/2017, 8:59 AM   I spent a total of 20 minutes in face to face in clinical consultation, greater than 50% of which was counseling/coordinating care for port

## 2017-11-08 ENCOUNTER — Ambulatory Visit (HOSPITAL_COMMUNITY)
Admission: RE | Admit: 2017-11-08 | Discharge: 2017-11-08 | Disposition: A | Discharge status: Home / Self Care | Payer: BLUE CROSS/BLUE SHIELD | Source: Ambulatory Visit | Attending: Hematology and Oncology | Admitting: Hematology and Oncology

## 2017-11-08 ENCOUNTER — Encounter (HOSPITAL_COMMUNITY): Payer: Self-pay | Admitting: Physician Assistant

## 2017-11-08 DIAGNOSIS — C801 Malignant (primary) neoplasm, unspecified: Secondary | ICD-10-CM

## 2017-11-08 DIAGNOSIS — R180 Malignant ascites: Secondary | ICD-10-CM | POA: Diagnosis not present

## 2017-11-08 DIAGNOSIS — C786 Secondary malignant neoplasm of retroperitoneum and peritoneum: Secondary | ICD-10-CM

## 2017-11-08 HISTORY — PX: IR PARACENTESIS: IMG2679

## 2017-11-08 MED ORDER — LIDOCAINE HCL (PF) 2 % IJ SOLN
INTRAMUSCULAR | Status: DC | PRN
  Administered 2017-11-08: 10 mL

## 2017-11-08 MED ORDER — LIDOCAINE HCL (PF) 2 % IJ SOLN
INTRAMUSCULAR | Status: AC
  Filled 2017-11-08: qty 20

## 2017-11-08 NOTE — Progress Notes (Signed)
Patient no longer needs port accessed

## 2017-11-08 NOTE — Procedures (Signed)
PROCEDURE SUMMARY:  Successful image-guided paracentesis from the right abdomen.  Yielded 2.6 liters of yellow fluid.  No immediate complications.  Patient tolerated well.   Specimen was not sent for labs. Patient to receive albumin today.  Joaquim Nam PA-C 11/08/2017 10:43 AM

## 2017-11-11 ENCOUNTER — Other Ambulatory Visit: Payer: Self-pay

## 2017-11-11 MED ORDER — ONDANSETRON HCL 4 MG PO TABS: 4.0000 mg | ORAL_TABLET | Freq: Four times a day (QID) | ORAL | 0 refills | Status: AC | PRN

## 2017-11-11 MED ORDER — LORAZEPAM 1 MG PO TABS: ORAL_TABLET | ORAL | 0 refills | Status: AC

## 2017-11-11 NOTE — Telephone Encounter (Signed)
Pt called to get refill on her nausea medication (zofran and ativan). Pt had been feeling constant nausea x2 months and it has decreased her appetite and limited her activity significantly. currenlty on ibrance and letrozole. Pt to see Lindsey,NP on wed. Told pt to discuss her symptoms and report any concerns for further evaluation of nausea. Pt verbalized understanding. Will call in refill to [facility]. No further needs at this time.

## 2017-11-13 ENCOUNTER — Other Ambulatory Visit: Payer: Self-pay | Admitting: Oncology

## 2017-11-13 ENCOUNTER — Other Ambulatory Visit: Payer: Self-pay

## 2017-11-13 ENCOUNTER — Encounter: Payer: Self-pay | Admitting: Adult Health

## 2017-11-13 ENCOUNTER — Inpatient Hospital Stay (HOSPITAL_BASED_OUTPATIENT_CLINIC_OR_DEPARTMENT_OTHER): Payer: BLUE CROSS/BLUE SHIELD | Admitting: Adult Health

## 2017-11-13 ENCOUNTER — Inpatient Hospital Stay: Payer: BLUE CROSS/BLUE SHIELD

## 2017-11-13 ENCOUNTER — Telehealth: Payer: Self-pay | Admitting: Adult Health

## 2017-11-13 VITALS — BP 106/74 | HR 114 | Temp 97.5°F | Resp 18 | Ht 68.5 in | Wt 116.5 lb

## 2017-11-13 DIAGNOSIS — Z170 Estrogen receptor positive status [ER+]: Secondary | ICD-10-CM

## 2017-11-13 DIAGNOSIS — C50412 Malignant neoplasm of upper-outer quadrant of left female breast: Secondary | ICD-10-CM

## 2017-11-13 DIAGNOSIS — Z79899 Other long term (current) drug therapy: Secondary | ICD-10-CM

## 2017-11-13 DIAGNOSIS — Z90710 Acquired absence of both cervix and uterus: Secondary | ICD-10-CM

## 2017-11-13 DIAGNOSIS — N1330 Unspecified hydronephrosis: Secondary | ICD-10-CM

## 2017-11-13 DIAGNOSIS — Z923 Personal history of irradiation: Secondary | ICD-10-CM

## 2017-11-13 DIAGNOSIS — C7951 Secondary malignant neoplasm of bone: Secondary | ICD-10-CM | POA: Diagnosis not present

## 2017-11-13 DIAGNOSIS — R110 Nausea: Secondary | ICD-10-CM

## 2017-11-13 DIAGNOSIS — C786 Secondary malignant neoplasm of retroperitoneum and peritoneum: Secondary | ICD-10-CM | POA: Diagnosis not present

## 2017-11-13 DIAGNOSIS — Z9013 Acquired absence of bilateral breasts and nipples: Secondary | ICD-10-CM

## 2017-11-13 DIAGNOSIS — Z9221 Personal history of antineoplastic chemotherapy: Secondary | ICD-10-CM

## 2017-11-13 DIAGNOSIS — R180 Malignant ascites: Secondary | ICD-10-CM

## 2017-11-13 DIAGNOSIS — R630 Anorexia: Secondary | ICD-10-CM

## 2017-11-13 DIAGNOSIS — Z90722 Acquired absence of ovaries, bilateral: Secondary | ICD-10-CM

## 2017-11-13 DIAGNOSIS — R5383 Other fatigue: Secondary | ICD-10-CM

## 2017-11-13 LAB — CBC WITH DIFFERENTIAL (CANCER CENTER ONLY)
BASOS PCT: 1 %
Basophils Absolute: 0.1 10*3/uL (ref 0.0–0.1)
EOS ABS: 0.1 10*3/uL (ref 0.0–0.5)
EOS PCT: 1 %
HCT: 36.5 % (ref 34.8–46.6)
HEMOGLOBIN: 11.4 g/dL — AB (ref 11.6–15.9)
Lymphocytes Relative: 14 %
Lymphs Abs: 1.3 10*3/uL (ref 0.9–3.3)
MCH: 28.9 pg (ref 25.1–34.0)
MCHC: 31.2 g/dL — AB (ref 31.5–36.0)
MCV: 92.4 fL (ref 79.5–101.0)
MONOS PCT: 13 %
Monocytes Absolute: 1.2 10*3/uL — ABNORMAL HIGH (ref 0.1–0.9)
NEUTROS PCT: 71 %
Neutro Abs: 6.8 10*3/uL — ABNORMAL HIGH (ref 1.5–6.5)
PLATELETS: 791 10*3/uL — AB (ref 145–400)
RBC: 3.95 MIL/uL (ref 3.70–5.45)
RDW: 19.6 % — ABNORMAL HIGH (ref 11.2–14.5)
WBC: 9.4 10*3/uL (ref 3.9–10.3)

## 2017-11-13 LAB — CMP (CANCER CENTER ONLY)
ALBUMIN: 1.7 g/dL — AB (ref 3.5–5.0)
ALK PHOS: 138 U/L — AB (ref 38–126)
ALT: <6 U/L (ref 0–44)
ANION GAP: 11 (ref 5–15)
AST: 21 U/L (ref 15–41)
BUN: 18 mg/dL (ref 6–20)
CO2: 26 mmol/L (ref 22–32)
Calcium: 8.9 mg/dL (ref 8.9–10.3)
Chloride: 99 mmol/L (ref 98–111)
Creatinine: 0.71 mg/dL (ref 0.44–1.00)
GFR, Est AFR Am: >60 mL/min (ref >60)
GFR, Estimated: >60 mL/min (ref >60)
GLUCOSE: 91 mg/dL (ref 70–99)
POTASSIUM: 3.8 mmol/L (ref 3.5–5.1)
SODIUM: 136 mmol/L (ref 135–145)
Total Bilirubin: 0.3 mg/dL (ref 0.3–1.2)
Total Protein: 6.1 g/dL — ABNORMAL LOW (ref 6.5–8.1)

## 2017-11-13 MED ORDER — FULVESTRANT 250 MG/5ML IM SOLN
INTRAMUSCULAR | Status: AC
  Filled 2017-11-13: qty 10

## 2017-11-13 NOTE — Telephone Encounter (Signed)
Per 7/24 los Mendel Ryder will check with MD to see were he can see patient then call her on Monday

## 2017-11-13 NOTE — Progress Notes (Signed)
Cynthia Rosario:    Cynthia Lose, MD [place] [id number]   DIAGNOSIS: Cancer Staging No matching staging information was found for the patient.  SUMMARY OF ONCOLOGIC HISTORY:   Breast cancer of upper-outer quadrant of left female breast ([place])   01/18/2002 Initial Diagnosis    Breast cancer of upper-outer quadrant of left female breast; left mastectomy 2/26 lymph nodes positive ER positive received adjuvant TAC chemotherapy followed by radiation and tamoxifen for 9 months stopped for side effects      01/19/2011 Relapse/Recurrence    Right breast augmentation surgery was attempted by Dr. Truman Hayward and was found to have a lesion in the upper outer quadrant 3.3 x 1.8 cm biopsy revealed invasive ductal carcinoma with lobular features ER 100% PR 88% HER-2 negative      02/06/2011 Surgery    Right breast mastectomy at [facility] and axillary lymph node dissection with removal of breast implant followed by immediate reconstruction with tissue expander 36 lymph nodes were positive received radiation therapy and no chemotherapy      03/21/2011 - 04/20/2011 Radiation Therapy    Radiation to chest wall and axilla      05/29/2011 - 12/31/2011 Anti-estrogen oral therapy    Patient received Arimidex for one week and Zoladex x5 months stop Arimidex due to side effects      07/23/2013 Relapse/Recurrence    2 years of back pain and abdominal symptoms with spotting after intercourse revealed uterus enlarged biopsy revealed metastatic breast cancer lobular type: ER positive PR negative      09/18/2013 Surgery    Hysterectomy and bilateral salpingo-oophorectomy at [facility] revealed metastatic breast cancer involving tubes and uterus      10/13/2013 - 10/20/2013 Anti-estrogen oral therapy    Femara for 30 days stopped for patchy alopecia and patient going for second and third opinions between [place], [place], [place] regarding the appropriate antiestrogen plan  (stopped by choice)      02/02/2015 Imaging    Multiple osseous metastases are present. Foci of enhancement are described at C7, T2, T6, T7, T9, and T10      07/29/2017 Imaging    ED visit for abdominal distention: CT abdomen pelvis: Significant ascites with omental caking and scattered areas of peritoneal enhancement/nodularity consistent with peritoneal carcinomatosis, osseous metastases, wall thickening rectosigmoid colon due to serosal implants      07/30/2017 Procedure    Paracentesis: Malignant cells consistent with adenocarcinoma ER positive PR negative GATA3 and GCDFP Positive; consistent with metastatic breast cancer, HER-2 pending      08/05/2017 -  Anti-estrogen oral therapy    Ibrance with letrozole      10/28/2017 Miscellaneous    Foundation 1 analysis: K-ras G12-V; PIK3CA N345K, CHEK2 T384f 15, KRAS G12D, PPP2R1A R183W; TMB and MSI: Could not be determined because of insufficient tissue       CURRENT THERAPY: Letrozole and Ibrance  INTERVAL HISTORY: [name]89y.o. female returns for evaluation of her metastatic breast cancer.  Cynthia Rosario due to significant fatigue.  Cynthia Rosario notes no improvement from the blood transfusion Cynthia Rosario received a couple of weeks ago.  Cynthia Rosario underwent paracentesis on Friday that removed 2.6 liters of fluid.  Cynthia Rosario notes a decreased appetite, waking Rosario in the middle of night, and nausea.  Cynthia Rosario continues to be fatigued.  Cynthia Rosario says Cynthia Rosario is not drinking enough fluids either.    Her husband notes that Cynthia Rosario has a cough, that isn't improving,  and that it was previously attributed to her ascites, however, now that the fluid accumulation in her abdomen is less, radiology does not think it is from that.     Patient Active Problem List   Diagnosis Date Noted  . SBO (small bowel obstruction) ([place]) 09/05/2017  . Bilateral hydronephrosis 09/05/2017  . Malignant ascites 08/05/2017  . Peritoneal carcinomatosis ([place]) 07/29/2017  . Special screening for  malignant neoplasms, colon 05/02/2017  . Incomplete passage of stool 05/02/2017  . Paresthesia 04/07/2015  . Breast cancer of upper-outer quadrant of left female breast (Cynthia Rosario)   . Ovarian cyst   . Osteopenia   . Cervical polyp   . Fibroid   . Kidney stones     is allergic to phenergan [promethazine hcl].  MEDICAL HISTORY: Past Medical History:  Diagnosis Date  . Abnormal pap   . Anemia   . Breast cancer ([place])   . Cervical polyp   . Chronic headaches   . Dermoid   . Fibroid   . Kidney stones   . Osteopenia   . Ovarian cyst   . Paresthesia 04/07/2015  . Urinary urgency     SURGICAL HISTORY: Past Surgical History:  Procedure Laterality Date  . ABDOMINAL HYSTERECTOMY  09/18/13   robotic hyst, bso for metastatic breast cancer, Dr Alycia Rossetti at [facility].  Marland Kitchen BREAST ENHANCEMENT SURGERY    . BREAST LUMPECTOMY     x2  . IR IMAGING GUIDED PORT INSERTION  11/07/2017  . IR PARACENTESIS  08/12/2017  . IR PARACENTESIS  10/07/2017  . IR PARACENTESIS  10/16/2017  . IR PARACENTESIS  10/25/2017  . IR PARACENTESIS  11/01/2017  . IR PARACENTESIS  11/08/2017  . MASTECTOMY Bilateral   . RECONSTRUCTION BREAST W/ TRAM FLAP    . WISDOM TOOTH EXTRACTION      SOCIAL HISTORY: Social History   Socioeconomic History  . Marital status: Married    Spouse name: Not on file  . Number of children: 2  . Years of education: master's  . Highest education level: Not on file  Occupational History  . Occupation: Sports coach  Social Needs  . Financial resource strain: Not on file  . Food insecurity:    Worry: Not on file    Inability: Not on file  . Transportation needs:    Medical: Not on file    Non-medical: Not on file  Tobacco Use  . Smoking status: Former Smoker    Last attempt to quit: 1983    Years since quitting: 36.5  . Smokeless tobacco: Never Used  Substance and Sexual Activity  . Alcohol use: No  . Drug use: No  . Sexual activity: Yes    Birth control/protection: Condom   Lifestyle  . Physical activity:    Days per week: Not on file    Minutes per session: Not on file  . Stress: Not on file  Relationships  . Social connections:    Talks on phone: Not on file    Gets together: Not on file    Attends religious service: Not on file    Active member of club or organization: Not on file    Attends meetings of clubs or organizations: Not on file    Relationship status: Not on file  . Intimate partner violence:    Fear of current or ex partner: Not on file    Emotionally abused: Not on file    Physically abused: Not on file    Forced sexual activity: Not  on file  Other Topics Concern  . Not on file  Social History Narrative   Patient drinks 2 cups of caffeine daily.   Patient right handed.     FAMILY HISTORY: Family History  Problem Relation Age of Onset  . Heart disease Father   . Hypertension Mother   . Stomach cancer Paternal Grandmother     Review of Systems  Constitutional: Positive for appetite change, fatigue and unexpected weight change. Negative for chills.  HENT:   Negative for hearing loss, lump/mass and trouble swallowing.   Eyes: Negative for eye problems and icterus.  Respiratory: Positive for cough and shortness of breath. Negative for chest tightness.   Cardiovascular: Negative for chest pain, leg swelling and palpitations.  Gastrointestinal: Positive for abdominal distention, constipation (takes dulcolax) and nausea. Negative for abdominal pain, blood in stool and vomiting.  Endocrine: Negative for hot flashes.  Neurological: Negative for dizziness, extremity weakness and headaches.  Hematological: Negative for adenopathy. Does not bruise/bleed easily.  Psychiatric/Behavioral: Positive for depression and sleep disturbance. The patient is nervous/anxious.       PHYSICAL EXAMINATION  ECOG PERFORMANCE STATUS: 3 - Symptomatic, >50% confined to bed  Vitals:   11/13/17 0930  BP: 106/74  Pulse: (!) 114  Resp: 18  Temp: (!)  97.5 F (36.4 C)  SpO2: 100%    Physical Exam  Constitutional: Cynthia Rosario is oriented to person, place, and time. Cynthia Rosario appears well-developed.  Appears tired and cachetic   HENT:  Head: Normocephalic and atraumatic.  Mouth/Throat: Oropharynx is clear and moist. No oropharyngeal exudate.  Eyes: Pupils are equal, round, and reactive to light. No scleral icterus.  Neck: Neck supple.  Cardiovascular: Normal rate, regular rhythm and normal heart sounds.  Pulmonary/Chest: Effort normal. No stridor. No respiratory distress. Cynthia Rosario has no wheezes. Cynthia Rosario has no rales.  Diminished breath sounds in the bases of her lungs bilaterally  Abdominal: Bowel sounds are normal. Cynthia Rosario exhibits distension. There is no tenderness.  Lymphadenopathy:    Cynthia Rosario has no cervical adenopathy.  Neurological: Cynthia Rosario is alert and oriented to person, place, and time.  Skin: Skin is warm and dry. Capillary refill takes less than 2 seconds.  Psychiatric:  Tearful     LABORATORY DATA:  CBC    Component Value Date/Time   WBC 9.4 11/13/2017 0854   WBC 6.4 11/07/2017 0749   RBC 3.95 11/13/2017 0854   HGB 11.4 (L) 11/13/2017 0854   HGB 10.5 (L) 03/05/2017 1444   HCT 36.5 11/13/2017 0854   HCT 32.2 (L) 03/05/2017 1444   PLT 791 (H) 11/13/2017 0854   PLT 242 03/05/2017 1444   MCV 92.4 11/13/2017 0854   MCV 95.5 03/05/2017 1444   MCH 28.9 11/13/2017 0854   MCHC 31.2 (L) 11/13/2017 0854   RDW 19.6 (H) 11/13/2017 0854   RDW 13.6 03/05/2017 1444   LYMPHSABS 1.3 11/13/2017 0854   LYMPHSABS 2.1 03/05/2017 1444   MONOABS 1.2 (H) 11/13/2017 0854   MONOABS 0.5 03/05/2017 1444   EOSABS 0.1 11/13/2017 0854   EOSABS 0.1 03/05/2017 1444   BASOSABS 0.1 11/13/2017 0854   BASOSABS 0.0 03/05/2017 1444    CMP     Component Value Date/Time   NA 136 11/13/2017 0854   NA 141 03/05/2017 1445   K 3.8 11/13/2017 0854   K 3.9 03/05/2017 1445   CL 99 11/13/2017 0854   CO2 26 11/13/2017 0854   CO2 26 03/05/2017 1445   GLUCOSE 91 11/13/2017  0854   GLUCOSE 91  03/05/2017 1445   BUN 18 11/13/2017 0854   BUN 17.0 03/05/2017 1445   CREATININE 0.71 11/13/2017 0854   CREATININE 0.8 03/05/2017 1445   CALCIUM 8.9 11/13/2017 0854   CALCIUM 9.2 03/05/2017 1445   PROT 6.1 (L) 11/13/2017 0854   PROT 6.6 03/05/2017 1445   ALBUMIN 1.7 (L) 11/13/2017 0854   ALBUMIN 3.5 03/05/2017 1445   AST 21 11/13/2017 0854   AST 21 03/05/2017 1445   ALT <6 11/13/2017 0854   ALT 10 03/05/2017 1445   ALKPHOS 138 (H) 11/13/2017 0854   ALKPHOS 94 03/05/2017 1445   BILITOT 0.3 11/13/2017 0854   BILITOT 0.38 03/05/2017 1445   GFRNONAA >60 11/13/2017 0854   GFRAA >60 11/13/2017 0854    ASSESSMENT and PLAN:   Breast cancer of upper-outer quadrant of left female breast ([place]) Metastatic breast cancer with previous bone metastases and newly diagnosed omental/peritoneal carcinomatosis with massive ascites 07/29/2017 Previously Cynthia Rosario refused antiestrogen therapy.  07/30/2017: Therapeutic paracentesis: Malignant ascites consistent with adenocarcinoma ER positive PR negative HER-2 negative  Current treatment: Antiestrogen therapywith letrozolealong with CDK 4 and 6 inhibitor like Ibrancestarted 08/05/2017  Hospitalization 09/05/2017 to 09/07/2017 due to small bowel obstruction and peritoneal carcinomatosis  CT chest abdomen pelvis 10/23/2017: Mild interval progression of intraperitoneal disease with increased mass-effect on the liver and bowel loops the discrete soft tissue components are less evident on Rosario's exam.  Bilateral hydronephrosis is less apparent.  Stable to diffuse gastric wall thickening as well as sigmoid colon.  Left axillary lymph nodes, extensive bone metastases  ___________________________________________________________________________  Earlie Server is not feeling well Rosario.  Cynthia Rosario and her husband would like recommendations on how Cynthia Rosario can improve her fatigue.  Cynthia Rosario is planning on trying to be more active.  I reviewed that the fatigue is likely  multifactorial due to: the Ibrance, her cancer, dehydration/decreased appetite/weight loss, challenges with sleeping, depression.  I recommended that Cynthia Rosario consider receiving a small amount of IV fluids Rosario to see how it makes her feel, and to consider taking Remeron at night to help with sleep, appetite, and depression.  Cynthia Rosario declined both of these options. I did give her a handout on the Mirtazapine, and if Cynthia Rosario changes her mind, Cynthia Rosario can certainly call, and I am happy to prescribe it, or Cynthia Rosario can certainly come in later this week to receive fluids.    In regards to the cough, I ordered chest xray as her lung exam was abnormal with diminished sounds in the bases.    Cynthia Rosario does not want to receive Fulvestrant Rosario, so Cynthia Rosario will review this with Dr. Lindi Adie at her next visit with him.    Cynthia Rosario will return next week for f/u and review of her concerns with Dr. Lindi Adie.     Orders Placed This Encounter  Procedures  . DG Chest 2 View    Standing Status:   Future    Standing Expiration Date:   11/13/2018    Order Specific Question:   Reason for Exam (SYMPTOM  OR DIAGNOSIS REQUIRED)    Answer:   diminished breath sounds in bases    Order Specific Question:   Is patient pregnant?    Answer:   No    Order Specific Question:   Preferred imaging location?    Answer:   [facility]    Order Specific Question:   Radiology Contrast Protocol - do NOT remove file path    Answer:   [email or web address]    All questions were answered. The patient  knows to call the clinic with any problems, questions or concerns. We can certainly see the patient much sooner if necessary.  A total of (30) minutes of face-to-face time was spent with this patient with greater than 50% of that time in counseling and care-coordination.  This note was electronically signed. Scot Dock, NP 11/13/2017

## 2017-11-13 NOTE — Patient Instructions (Signed)
Mirtazapine tablets What is this medicine? MIRTAZAPINE (mir TAZ a peen) is used to treat depression. This medicine may be used for other purposes; ask your health care provider or pharmacist if you have questions. COMMON BRAND NAME(S): Remeron What should I tell my health care provider before I take this medicine? They need to know if you have any of these conditions: -bipolar disorder -glaucoma -kidney disease -liver disease -suicidal thoughts -an unusual or allergic reaction to mirtazapine, other medicines, foods, dyes, or preservatives -pregnant or trying to get pregnant -breast-feeding How should I use this medicine? Take this medicine by mouth with a glass of water. Follow the directions on the prescription label. Take your medicine at regular intervals. Do not take your medicine more often than directed. Do not stop taking this medicine suddenly except upon the advice of your doctor. Stopping this medicine too quickly may cause serious side effects or your condition may worsen. A special MedGuide will be given to you by the pharmacist with each prescription and refill. Be sure to read this information carefully each time. Talk to your pediatrician regarding the use of this medicine in children. Special care may be needed. Overdosage: If you think you have taken too much of this medicine contact a poison control center or emergency room at once. NOTE: This medicine is only for you. Do not share this medicine with others. What if I miss a dose? If you miss a dose, take it as soon as you can. If it is almost time for your next dose, take only that dose. Do not take double or extra doses. What may interact with this medicine? Do not take this medicine with any of the following medications: -linezolid -MAOIs like Carbex, Eldepryl, Marplan, Nardil, and Parnate -methylene blue (injected into a vein) This medicine may also interact with the following medications: -alcohol -antiviral  medicines for HIV or AIDS -certain medicines that treat or prevent blood clots like warfarin -certain medicines for depression, anxiety, or psychotic disturbances -certain medicines for fungal infections like ketoconazole and itraconazole -certain medicines for migraine headache like almotriptan, eletriptan, frovatriptan, naratriptan, rizatriptan, sumatriptan, zolmitriptan -certain medicines for seizures like carbamazepine or phenytoin -certain medicines for sleep -cimetidine -erythromycin -fentanyl -lithium -medicines for blood pressure -nefazodone -rasagiline -rifampin -supplements like St. John's wort, kava kava, valerian -tramadol -tryptophan This list may not describe all possible interactions. Give your health care provider a list of all the medicines, herbs, non-prescription drugs, or dietary supplements you use. Also tell them if you smoke, drink alcohol, or use illegal drugs. Some items may interact with your medicine. What should I watch for while using this medicine? Tell your doctor if your symptoms do not get better or if they get worse. Visit your doctor or health care professional for regular checks on your progress. Because it may take several weeks to see the full effects of this medicine, it is important to continue your treatment as prescribed by your doctor. Patients and their families should watch out for new or worsening thoughts of suicide or depression. Also watch out for sudden changes in feelings such as feeling anxious, agitated, panicky, irritable, hostile, aggressive, impulsive, severely restless, overly excited and hyperactive, or not being able to sleep. If this happens, especially at the beginning of treatment or after a change in dose, call your health care professional. You may get drowsy or dizzy. Do not drive, use machinery, or do anything that needs mental alertness until you know how this medicine affects you. Do not   stand or sit up quickly, especially if  you are an older patient. This reduces the risk of dizzy or fainting spells. Alcohol may interfere with the effect of this medicine. Avoid alcoholic drinks. This medicine may cause dry eyes and blurred vision. If you wear contact lenses you may feel some discomfort. Lubricating drops may help. See your eye doctor if the problem does not go away or is severe. Your mouth may get dry. Chewing sugarless gum or sucking hard candy, and drinking plenty of water may help. Contact your doctor if the problem does not go away or is severe. What side effects may I notice from receiving this medicine? Side effects that you should report to your doctor or health care professional as soon as possible: -allergic reactions like skin rash, itching or hives, swelling of the face, lips, or tongue -anxious -changes in vision -chest pain -confusion -elevated mood, decreased need for sleep, racing thoughts, impulsive behavior -eye pain -fast, irregular heartbeat -feeling faint or lightheaded, falls -feeling agitated, angry, or irritable -fever or chills, sore throat -hallucination, loss of contact with reality -loss of balance or coordination -mouth sores -redness, blistering, peeling or loosening of the skin, including inside the mouth -restlessness, pacing, inability to keep still -seizures -stiff muscles -suicidal thoughts or other mood changes -trouble passing urine or change in the amount of urine -trouble sleeping -unusual bleeding or bruising -unusually weak or tired -vomiting Side effects that usually do not require medical attention (report to your doctor or health care professional if they continue or are bothersome): -change in appetite -constipation -dizziness -dry mouth -muscle aches or pains -nausea -tired -weight gain This list may not describe all possible side effects. Call your doctor for medical advice about side effects. You may report side effects to FDA at 1-800-FDA-1088. Where  should I keep my medicine? Keep out of the reach of children. Store at room temperature between 15 and 30 degrees C (59 and 86 degrees F) Protect from light and moisture. Throw away any unused medicine after the expiration date. NOTE: This sheet is a summary. It may not cover all possible information. If you have questions about this medicine, talk to your doctor, pharmacist, or health care provider.  2018 Elsevier/Gold Standard (2015-09-08 17:30:45)  

## 2017-11-13 NOTE — Assessment & Plan Note (Addendum)
Metastatic breast cancer with previous bone metastases and newly diagnosed omental/peritoneal carcinomatosis with massive ascites 07/29/2017 Previously she refused antiestrogen therapy.  07/30/2017: Therapeutic paracentesis: Malignant ascites consistent with adenocarcinoma ER positive PR negative HER-2 negative  Current treatment: Antiestrogen therapywith letrozolealong with CDK 4 and 6 inhibitor like Ibrancestarted 08/05/2017  Hospitalization 09/05/2017 to 09/07/2017 due to small bowel obstruction and peritoneal carcinomatosis  CT chest abdomen pelvis 10/23/2017: Mild interval progression of intraperitoneal disease with increased mass-effect on the liver and bowel loops the discrete soft tissue components are less evident on today's exam.  Bilateral hydronephrosis is less apparent.  Stable to diffuse gastric wall thickening as well as sigmoid colon.  Left axillary lymph nodes, extensive bone metastases  ___________________________________________________________________________  Cynthia Rosario is not feeling well today.  She and her husband would like recommendations on how she can improve her fatigue.  She is planning on trying to be more active.  I reviewed that the fatigue is likely multifactorial due to: the Ibrance, her cancer, dehydration/decreased appetite/weight loss, challenges with sleeping, depression.  I recommended that she consider receiving a small amount of IV fluids today to see how it makes her feel, and to consider taking Remeron at night to help with sleep, appetite, and depression.  She declined both of these options. I did give her a handout on the Mirtazapine, and if she changes her mind, she can certainly call, and I am happy to prescribe it, or she can certainly come in later this week to receive fluids.    In regards to the cough, I ordered chest xray as her lung exam was abnormal with diminished sounds in the bases.    She does not want to receive Fulvestrant today, so she will  review this with Dr. Lindi Adie at her next visit with him.    She will return next week for f/u and review of her concerns with Dr. Lindi Adie.

## 2017-11-15 ENCOUNTER — Ambulatory Visit (HOSPITAL_COMMUNITY)
Admission: RE | Admit: 2017-11-15 | Discharge: 2017-11-15 | Disposition: A | Discharge status: Home / Self Care | Payer: BLUE CROSS/BLUE SHIELD | Source: Ambulatory Visit | Attending: Hematology and Oncology | Admitting: Hematology and Oncology

## 2017-11-15 ENCOUNTER — Other Ambulatory Visit: Payer: Self-pay | Admitting: Hematology and Oncology

## 2017-11-15 ENCOUNTER — Other Ambulatory Visit: Payer: Self-pay | Admitting: Adult Health

## 2017-11-15 DIAGNOSIS — C801 Malignant (primary) neoplasm, unspecified: Secondary | ICD-10-CM | POA: Diagnosis not present

## 2017-11-15 DIAGNOSIS — R188 Other ascites: Secondary | ICD-10-CM | POA: Insufficient documentation

## 2017-11-15 DIAGNOSIS — R180 Malignant ascites: Secondary | ICD-10-CM

## 2017-11-15 DIAGNOSIS — C786 Secondary malignant neoplasm of retroperitoneum and peritoneum: Secondary | ICD-10-CM

## 2017-11-15 MED ORDER — LIDOCAINE HCL (PF) 2 % IJ SOLN
INTRAMUSCULAR | Status: AC
  Filled 2017-11-15: qty 20

## 2017-11-15 NOTE — Progress Notes (Signed)
Patient ID: Cynthia Rosario, female   DOB: 12-Aug-1955, 62 y.o.   MRN: [id number]   Pt is scheduled today for weekly paracentesis  Limited US Abd performed Very small collection of fluid noted near liver tip Otherwise no ascites appreciated  No paracentesis performed  DC home

## 2017-11-18 ENCOUNTER — Telehealth: Payer: Self-pay | Admitting: Hematology and Oncology

## 2017-11-18 NOTE — Telephone Encounter (Signed)
Spoke w/ husband and got appt set up to see VG

## 2017-11-19 ENCOUNTER — Telehealth: Payer: Self-pay

## 2017-11-19 ENCOUNTER — Telehealth: Payer: Self-pay | Admitting: *Deleted

## 2017-11-19 ENCOUNTER — Other Ambulatory Visit: Payer: Self-pay | Admitting: Hematology and Oncology

## 2017-11-19 ENCOUNTER — Inpatient Hospital Stay: Payer: BLUE CROSS/BLUE SHIELD | Admitting: Hematology and Oncology

## 2017-11-19 MED ORDER — FLUCONAZOLE 100 MG PO TABS
100.0000 mg | ORAL_TABLET | Freq: Every day | ORAL | 0 refills | Status: AC
Start: 2017-11-19 — End: ?

## 2017-11-19 NOTE — Assessment & Plan Note (Deleted)
Metastatic breast cancer with previous bone metastases and newly diagnosed omental/peritoneal carcinomatosis with massive ascites 07/29/2017 Previously she refused antiestrogen therapy.  07/30/2017: Therapeutic paracentesis: Malignant ascites consistent with adenocarcinoma ER positive PR negative HER-2 negative  Current treatment: Antiestrogen therapywith letrozolealong with CDK 4 and 6 inhibitor like Ibrancestarted 08/05/2017  Toxicities: 1.Anemia: Hemoglobin is 8.5we will continue to monitor this. 2.leukopenia: ANC0.6.  Now on 100 mg dosage starting 09/09/2017.  Hospitalization 09/05/2017 to 09/07/2017 due to small bowel obstruction and peritoneal carcinomatosis  Because of progression of disease I recommended changing treatment from Ibrance letrozole to Ibrance with Faslodex.  However she is not interested in switching to Faslodex. Other treatment options include 1. Piqray 2. exemestane with everolimus  Abdominal ascites: Recurrent Severe hypoalbuminemia  Return to clinic in 2 weeks for follow-up

## 2017-11-19 NOTE — Telephone Encounter (Signed)
Per Dr Lindi Adie, Hospice referral for patient.  [place] [phone], spoke with Luetta Nutting - reports she will contact pt's husband and nurse may be able to see pt today at 2:30 pm or in morning.  I provided office number for any additional info.

## 2017-11-19 NOTE — Telephone Encounter (Signed)
Spoke with Earlie Server upon hearing voicemail "unable to come in/keep today's 12:15 F/U appointment feeling so bad with diarrhea, We'd like a phone consult today to discuss our concerns." Crying throughout conversation.    "I'm feeling sick and don't know what to do.  Feel so bad I cannot come in but my husband is on the way.    Having uncontrollable diarrhea.  Two diarrhea stools so far today. I do not touch the Mira-Lax or stool softener.  I can't do anything I'm so tired and weak.  My husband has to wash my hair.   Not getting any nourishment.  Cannot eat or drink so I go two or more days without eating. This morning I threw up after trying to eat breakfast of scramled egg, toast and liquid yogurt.  Vomited about a water glass amount of clear. watery vomit with some food particles.  Took one Gas-X because Ativan & Zofran do not work anymore but Brewing technologist didn't help.   Feel short of breath since my last paracentesis but unable to have another paracentesis, there's no fluid only swelling.  My stomach feels like hard knots.   I've had cancer sixteen years and this is crazy.  Do not want to go to the ER.  Can he just admit me?"

## 2017-11-22 ENCOUNTER — Inpatient Hospital Stay (HOSPITAL_COMMUNITY): Admission: RE | Admit: 2017-11-22 | Payer: BLUE CROSS/BLUE SHIELD | Source: Ambulatory Visit

## 2017-11-27 ENCOUNTER — Inpatient Hospital Stay: Payer: BLUE CROSS/BLUE SHIELD

## 2017-12-22 DEATH — deceased

## 2018-01-08 ENCOUNTER — Telehealth: Payer: Self-pay

## 2018-01-08 NOTE — Telephone Encounter (Signed)
Pt husband called to inquire about pt genetic test results that was done a few months back. Pt is now deceased, but husband was not able to determine genetic results and if there was anything that he needs to do. Forward message to genetic counselor for further follow up.   Sent msg to HIM to notify them that pt has been deceased since 12-23-2022.

## 2019-08-29 IMAGING — CT CT ABD-PELV W/ CM
2 of 5 series · 15 of 46 positions shown, 17 images · IV contrast (ISOVUE)
Comparison: 10/30/2006

CLINICAL DATA: Abdominal distension for 1 week getting worse,
breast cancer, former smoker

EXAM:
CT ABDOMEN AND PELVIS WITH CONTRAST
TECHNIQUE: Multidetector CT imaging of the abdomen and pelvis was performed
using the standard protocol following bolus administration of
intravenous contrast. Sagittal and coronal MPR images reconstructed
from axial data set.
CONTRAST:  100mL [id number] IOPAMIDOL ([id number]) INJECTION 61% IV.
No oral contrast.

[Series 2: axial st · axial · 0.72mm/px · z∈[-453,-8]mm · 12 of 103 slices shown, 14 images]
[im 7/103  soft-tissue]
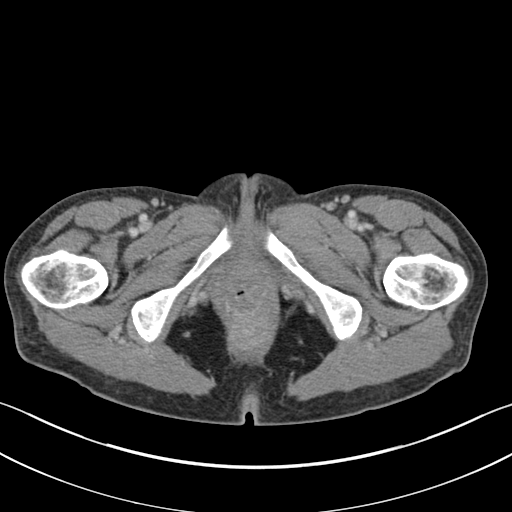
[im 7/103  bone]
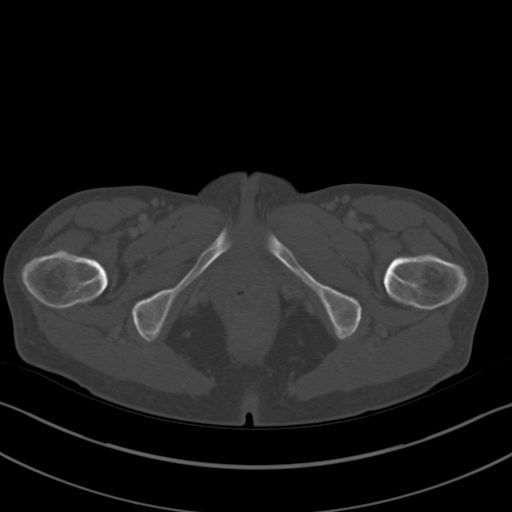
[im 13/103  soft-tissue]
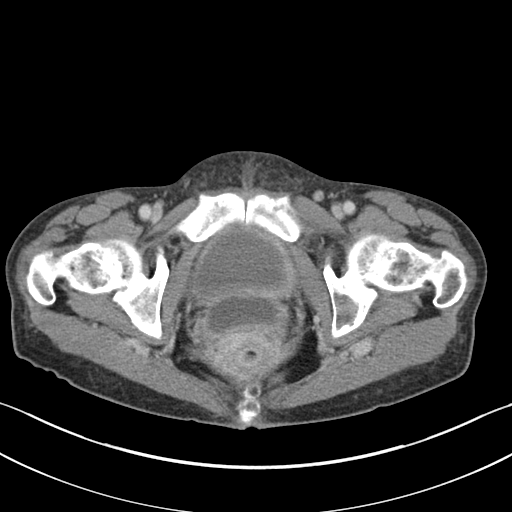
[im 26/103  soft-tissue]
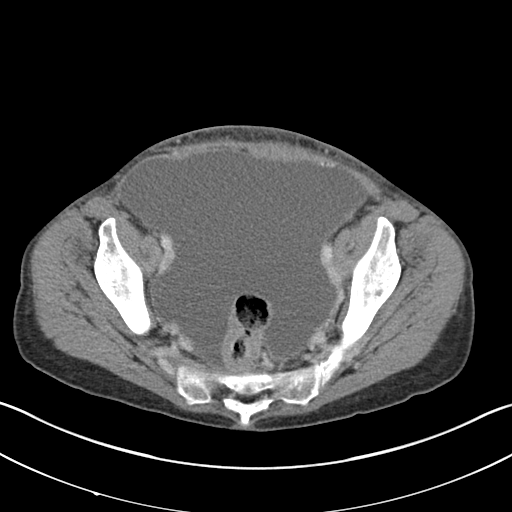
[im 32/103  soft-tissue]
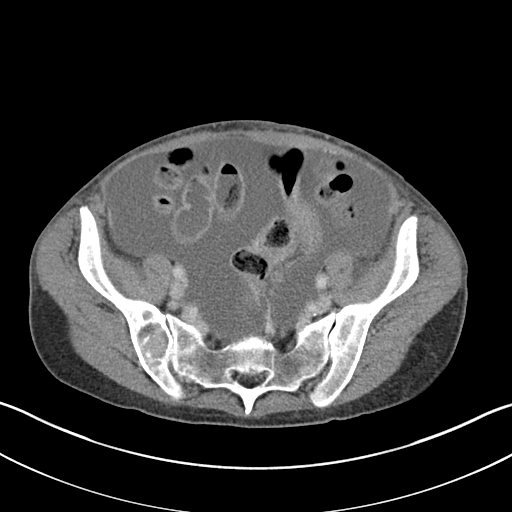
[im 39/103  soft-tissue]
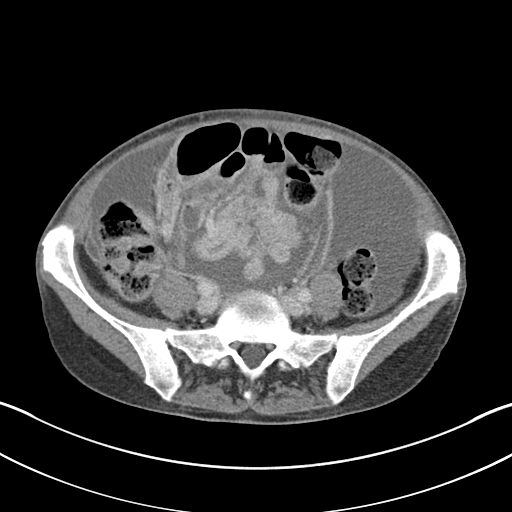
[im 45/103  soft-tissue]
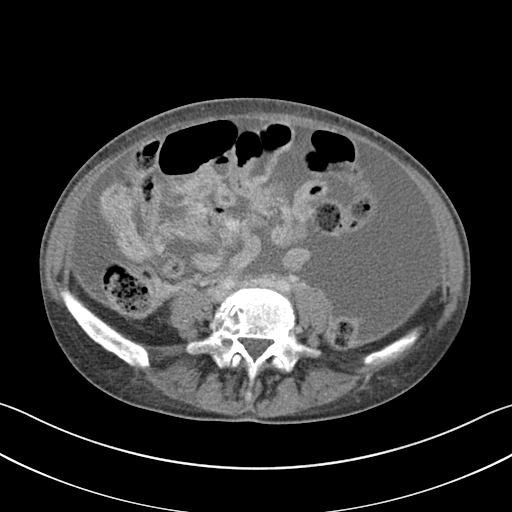
[im 58/103  soft-tissue]
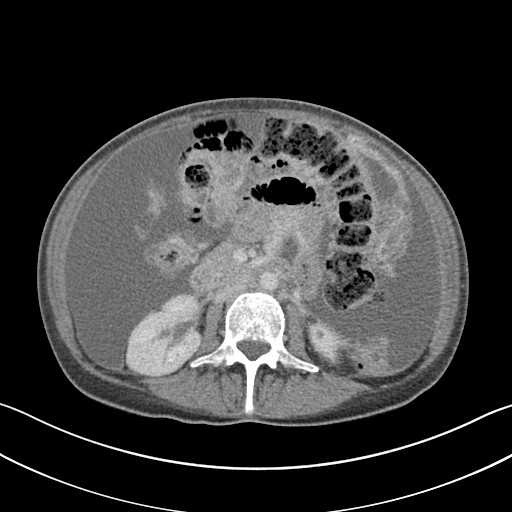
[im 64/103  soft-tissue]
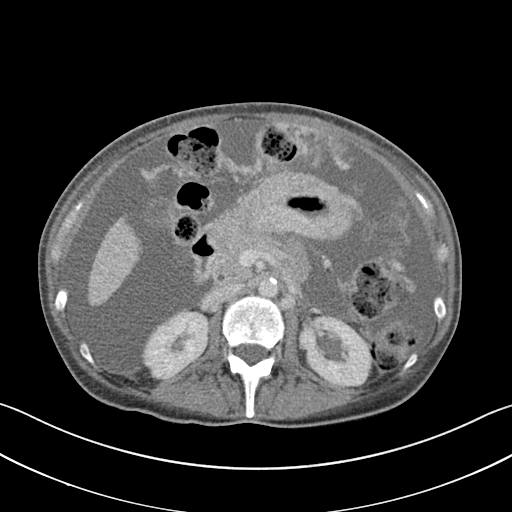
[im 71/103  soft-tissue]
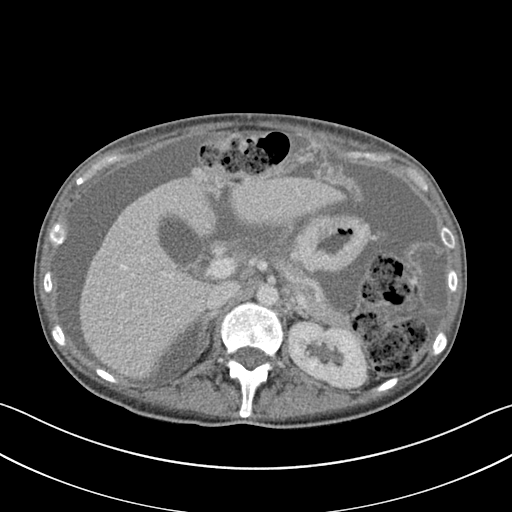
[im 71/103  bone]
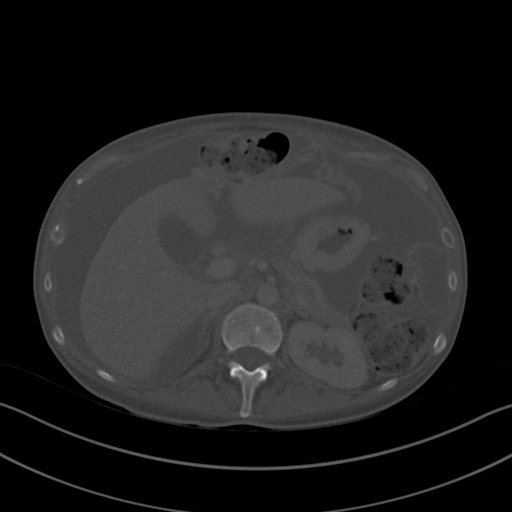
[im 77/103  soft-tissue]
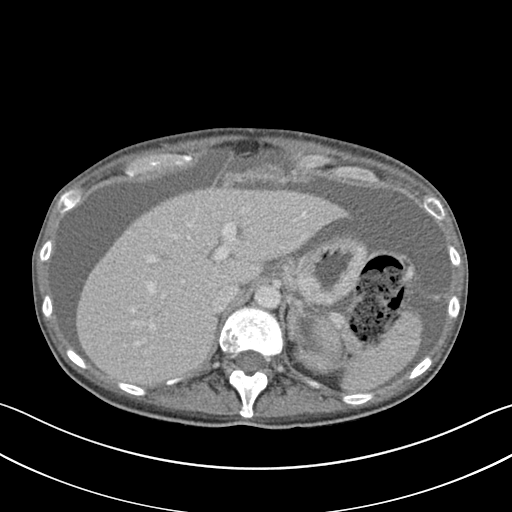
[im 90/103  soft-tissue]
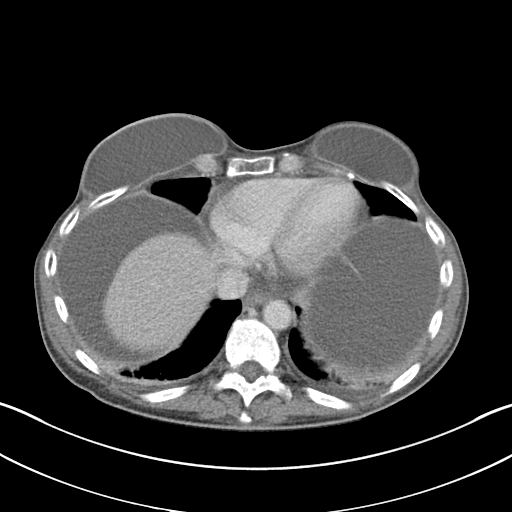
[im 96/103  soft-tissue]
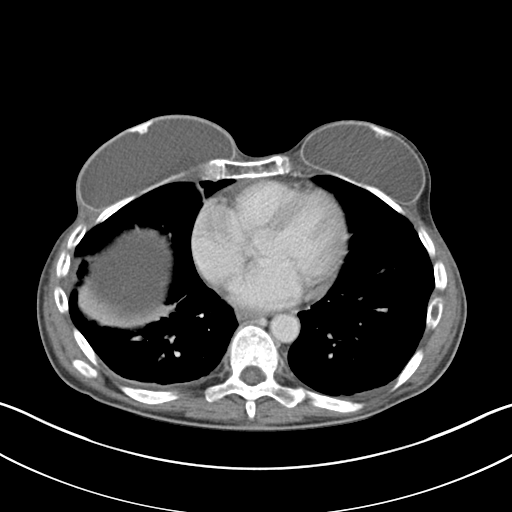

[Series 5: coronal st · coronal · 0.70mm/px · 3 of 88 slices shown]
[im 30/88  soft-tissue]
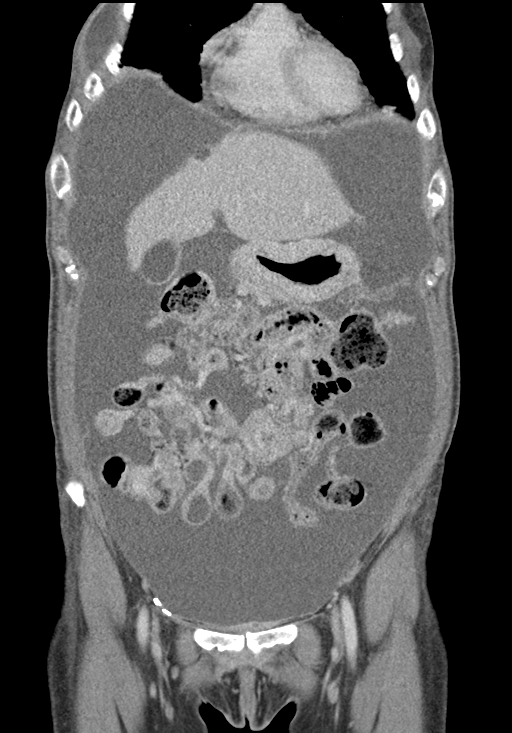
[im 39/88  soft-tissue]
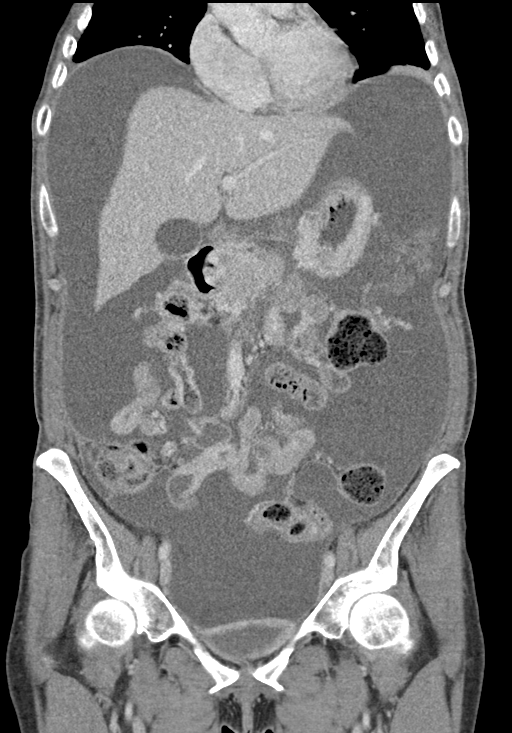
[im 49/88  soft-tissue]
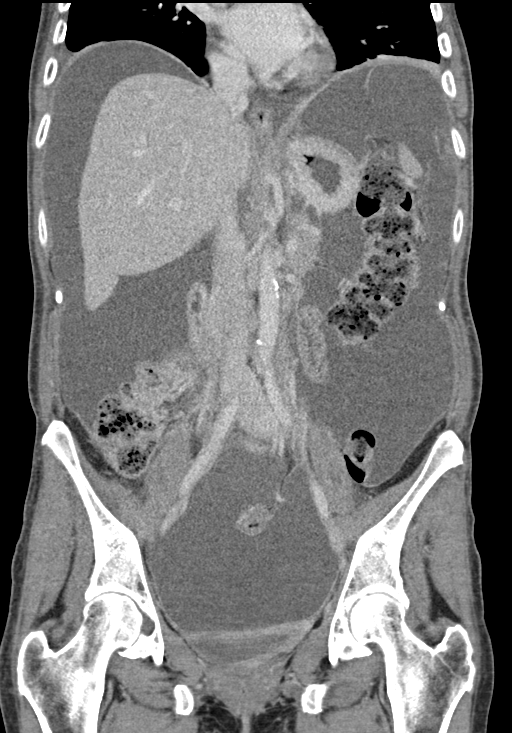

[15 of 46 positions shown; findings below may reference images not displayed]

FINDINGS: Lower chest: BILATERAL breast prostheses. Tiny BILATERAL pleural
effusions and bibasilar atelectasis.

Hepatobiliary: New subcapsular cyst anterior LEFT lobe liver image
22. Gallbladder liver otherwise normal appearance

Pancreas: Normal appearance

Spleen: Normal appearance

Adrenals/Urinary Tract: Adrenal glands normal appearance. Small LEFT
renal cysts. Kidneys, ureters and bladder otherwise normal
appearance.

Stomach/Bowel: Mild rectosigmoid wall thickening. Gastric wall
minimally prominent but stomach is under distended limiting
assessment. Remaining bowel loops unremarkable. Appendix not
visualized.

Vascular/Lymphatic: Vascular structures patent with mild scattered
atherosclerotic calcification of the abdominal aorta and iliac
arteries. Aorta normal caliber. Few normal sized retroperitoneal
lymph nodes.

Reproductive: Uterus surgically absent. Nonvisualization of ovaries.

Other: Significant ascites. Omental caking with additional
peritoneal enhancement at the pericolic gutters and at the pelvis
compatible with peritoneal carcinomatosis. Bowel displaced
centrally. No hernia. No free air.

Musculoskeletal: Multiple lytic metastases identified including the
pelvis bilaterally, multiple thoracolumbar vertebra, and the lateral
LEFT eighth rib which likely has a pathologic fracture.
IMPRESSION: Significant ascites with omental caking and scattered areas of
peritoneal enhancement/slight nodularity consistent with peritoneal
carcinomatosis.

Osseous metastases.

Mild wall thickening of the rectosigmoid colon, majority of which is
intraperitoneal, question serosal implants though segmental distal
colitis not excluded.

Tiny bibasilar pleural effusions and minimal atelectasis.

## 2019-08-30 IMAGING — US US PARACENTESIS
1 series · 5 of 5 positions shown · non-contrast
Comparison: none

INDICATION: Patient with history of metastatic breast cancer, ascites. Request
made for diagnostic and therapeutic paracentesis.

[Series 1: us paracentesis · 5 of 5 slices shown]
[im 1/5]
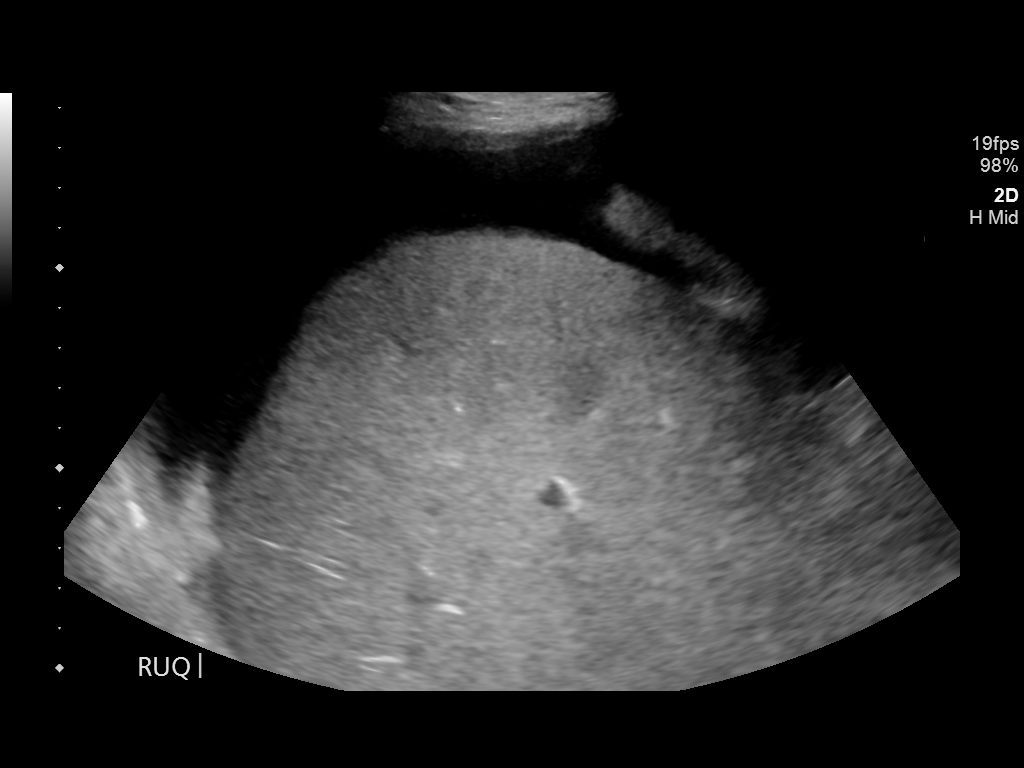
[im 2/5]
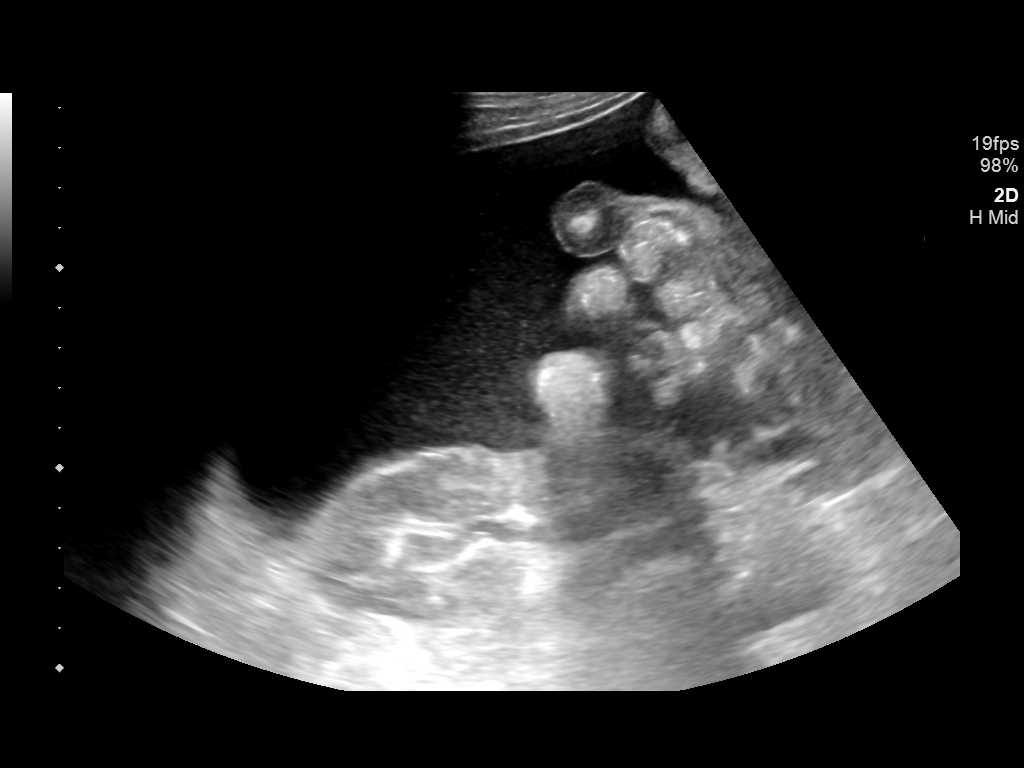
[im 3/5]
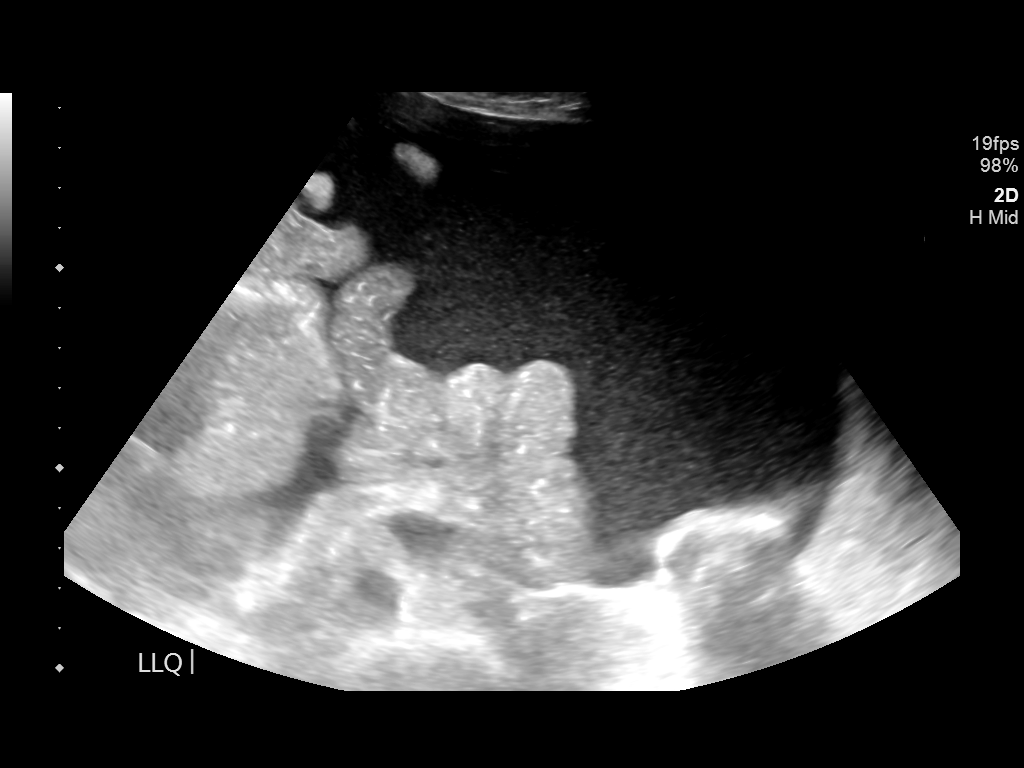
[im 4/5]
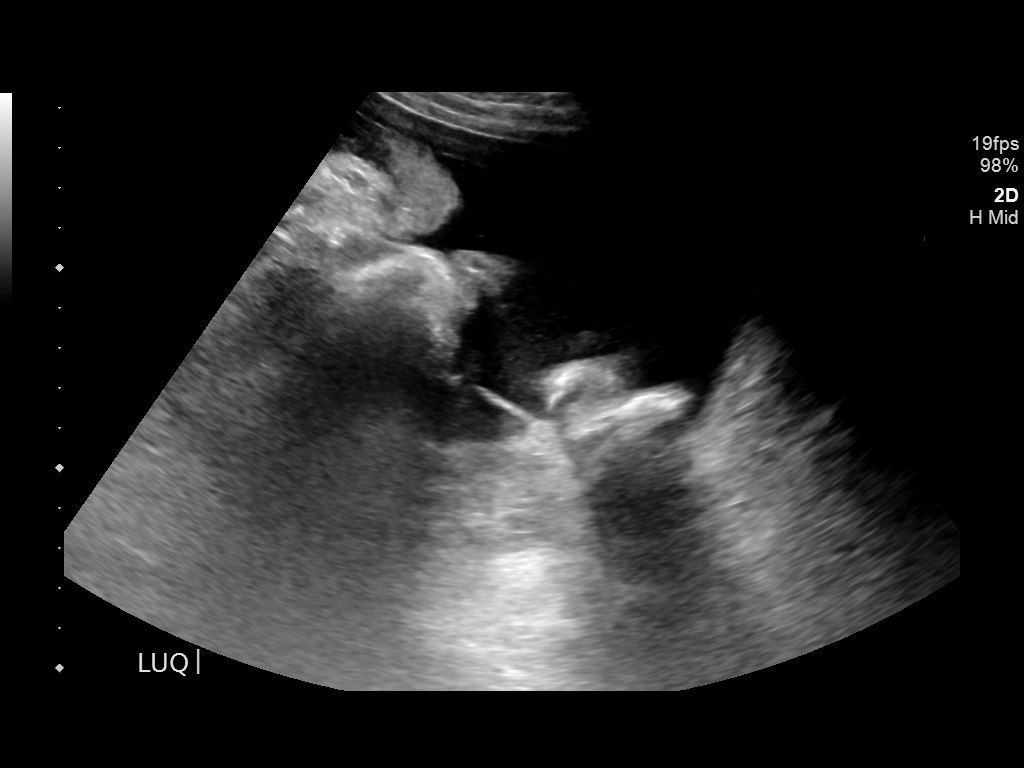
[im 5/5]
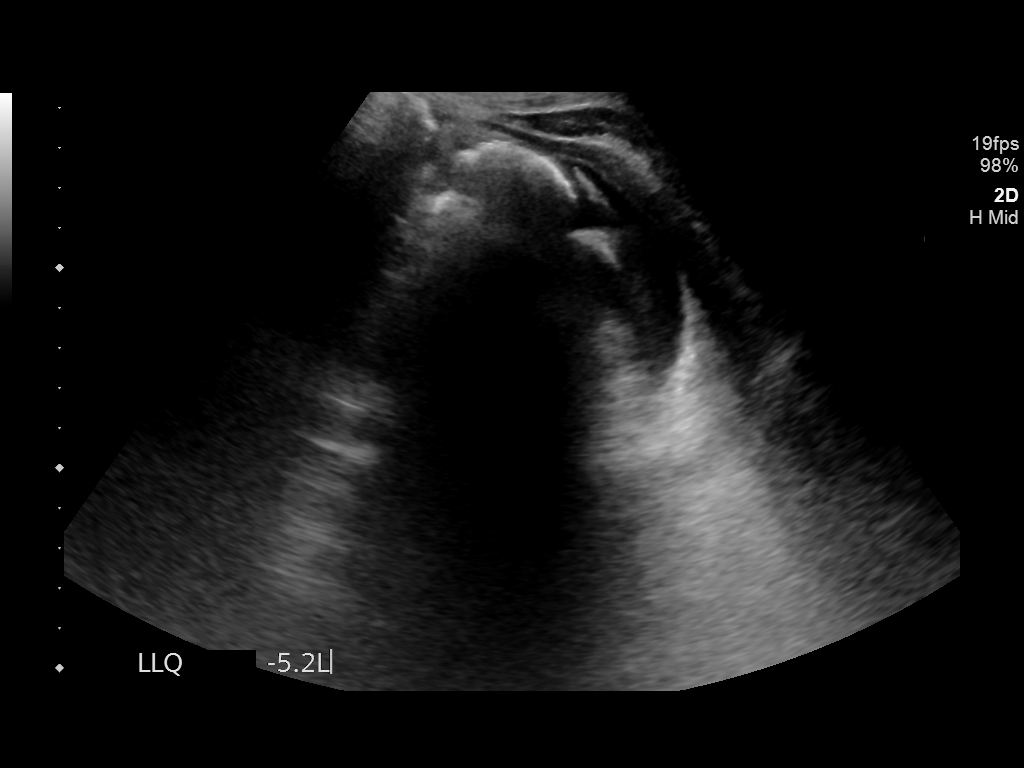

[5 of 5 positions shown; findings below may reference images not displayed]

EXAM:
ULTRASOUND GUIDED DIAGNOSTIC AND THERAPEUTIC PARACENTESIS

MEDICATIONS:
None

COMPLICATIONS:
None immediate.

PROCEDURE:
Informed written consent was obtained from the patient after a
discussion of the risks, benefits and alternatives to treatment. A
timeout was performed prior to the initiation of the procedure.

Initial ultrasound scanning demonstrates a large amount of ascites
within the left lower abdominal quadrant. The left lower abdomen was
prepped and draped in the usual sterile fashion. 1% lidocaine was
used for local anesthesia.

Following this, a 19 gauge, 7-cm, Yueh catheter was introduced. An
ultrasound image was saved for documentation purposes. The
paracentesis was performed. The catheter was removed and a dressing
was applied. The patient tolerated the procedure well without
immediate post procedural complication.
FINDINGS: A total of approximately 5.2 liters of hazy, amber fluid was
removed. Samples were sent to the laboratory as requested by the
clinical team.
IMPRESSION: Successful ultrasound-guided diagnostic and therapeutic paracentesis
yielding 5.2 liters of peritoneal fluid.

## 2019-11-25 IMAGING — US IR PARACENTESIS
1 series · 3 of 3 positions shown · non-contrast
Comparison: none

INDICATION: Patient with malignant ascites. Request made for therapeutic
paracentesis. Patient to receive albumin with procedure today.

[Series 1: ir (id) (id)/(id)/(id) ir · 3 of 3 slices shown]
[im 1/3]
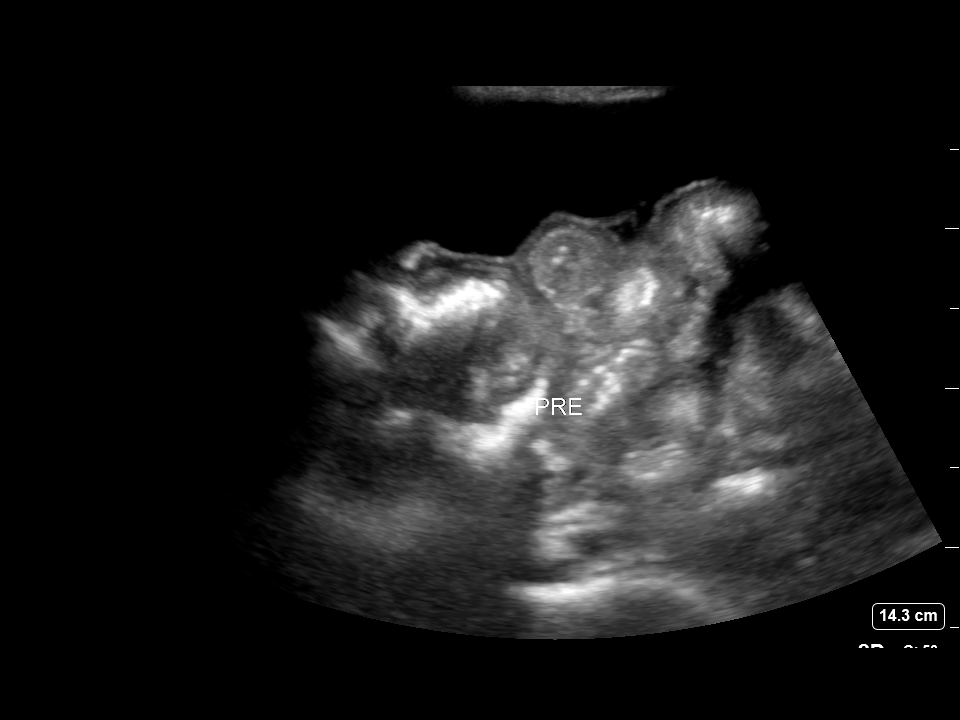
[im 2/3]
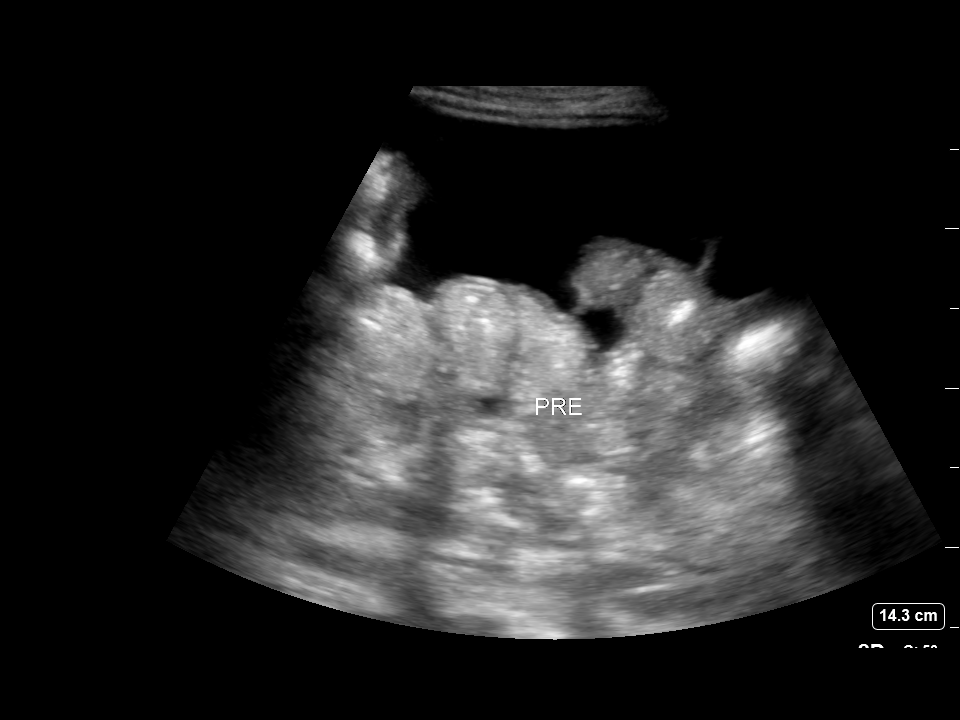
[im 3/3]
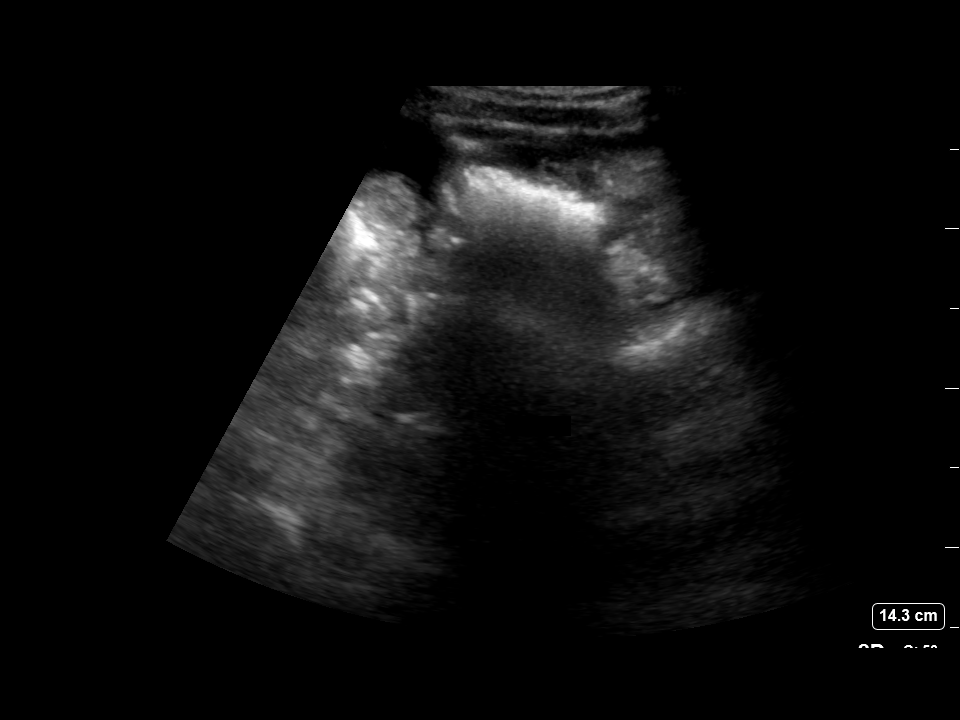

[3 of 3 positions shown; findings below may reference images not displayed]

EXAM:
ULTRASOUND GUIDED THERAPEUTIC PARACENTESIS

MEDICATIONS:
10 mL 2% lidocaine

COMPLICATIONS:
None immediate.

PROCEDURE:
Informed written consent was obtained from the patient after a
discussion of the risks, benefits and alternatives to treatment. A
timeout was performed prior to the initiation of the procedure.

Initial ultrasound scanning demonstrates a moderate amount of
ascites within the left lateral abdomen. The left lateral abdomen
was prepped and draped in the usual sterile fashion. 2% lidocaine
was used for local anesthesia.

Following this, a 19 gauge, 7-cm, Yueh catheter was introduced. An
ultrasound image was saved for documentation purposes. The
paracentesis was performed. The catheter was removed and a dressing
was applied. The patient tolerated the procedure well without
immediate post procedural complication.
FINDINGS: A total of approximately 3.4 liters of yellow fluid was removed.
IMPRESSION: Successful ultrasound-guided therapeutic paracentesis yielding
liters of peritoneal fluid.
